# Patient Record
Sex: Female | Born: 1965 | Race: Black or African American | Hispanic: No | Marital: Single | Smoking: Current some day smoker
Health system: Southern US, Community
[De-identification: ages and names within clinical notes are randomized; demographics above are authoritative.]

## PROBLEM LIST (undated history)

## (undated) DIAGNOSIS — I10 Essential (primary) hypertension: Secondary | ICD-10-CM

## (undated) DIAGNOSIS — E785 Hyperlipidemia, unspecified: Secondary | ICD-10-CM

## (undated) DIAGNOSIS — G8929 Other chronic pain: Secondary | ICD-10-CM

## (undated) DIAGNOSIS — F32A Depression, unspecified: Secondary | ICD-10-CM

## (undated) DIAGNOSIS — R112 Nausea with vomiting, unspecified: Secondary | ICD-10-CM

## (undated) DIAGNOSIS — F419 Anxiety disorder, unspecified: Secondary | ICD-10-CM

## (undated) DIAGNOSIS — G4730 Sleep apnea, unspecified: Secondary | ICD-10-CM

## (undated) DIAGNOSIS — B449 Aspergillosis, unspecified: Secondary | ICD-10-CM

## (undated) DIAGNOSIS — Z9889 Other specified postprocedural states: Secondary | ICD-10-CM

## (undated) DIAGNOSIS — E119 Type 2 diabetes mellitus without complications: Secondary | ICD-10-CM

## (undated) DIAGNOSIS — M1990 Unspecified osteoarthritis, unspecified site: Secondary | ICD-10-CM

## (undated) HISTORY — DX: Sleep apnea, unspecified: G47.30

## (undated) HISTORY — DX: Other chronic pain: G89.29

## (undated) HISTORY — PX: FRACTURE SURGERY: SHX138

## (undated) HISTORY — DX: Type 2 diabetes mellitus without complications: E11.9

## (undated) HISTORY — DX: Unspecified osteoarthritis, unspecified site: M19.90

---

## 1997-06-17 HISTORY — PX: ABDOMINAL HYSTERECTOMY: SHX81

## 1998-09-16 ENCOUNTER — Emergency Department (HOSPITAL_COMMUNITY): Admission: EM | Admit: 1998-09-16 | Discharge: 1998-09-16 | Payer: Self-pay | Admitting: Emergency Medicine

## 1999-04-07 ENCOUNTER — Emergency Department (HOSPITAL_COMMUNITY): Admission: EM | Admit: 1999-04-07 | Discharge: 1999-04-07 | Payer: Self-pay | Admitting: Emergency Medicine

## 1999-09-21 ENCOUNTER — Emergency Department (HOSPITAL_COMMUNITY): Admission: EM | Admit: 1999-09-21 | Discharge: 1999-09-21 | Payer: Self-pay | Admitting: Emergency Medicine

## 1999-11-03 ENCOUNTER — Encounter: Payer: Self-pay | Admitting: Emergency Medicine

## 1999-11-03 ENCOUNTER — Emergency Department (HOSPITAL_COMMUNITY): Admission: EM | Admit: 1999-11-03 | Discharge: 1999-11-03 | Payer: Self-pay | Admitting: Emergency Medicine

## 1999-11-13 ENCOUNTER — Ambulatory Visit (HOSPITAL_COMMUNITY): Admission: RE | Admit: 1999-11-13 | Discharge: 1999-11-13 | Payer: Self-pay | Admitting: Orthopedic Surgery

## 2000-05-17 ENCOUNTER — Emergency Department (HOSPITAL_COMMUNITY): Admission: EM | Admit: 2000-05-17 | Discharge: 2000-05-17 | Payer: Self-pay | Admitting: Emergency Medicine

## 2000-06-08 ENCOUNTER — Inpatient Hospital Stay (HOSPITAL_COMMUNITY): Admission: EM | Admit: 2000-06-08 | Discharge: 2000-06-14 | Payer: Self-pay | Admitting: Emergency Medicine

## 2000-06-08 ENCOUNTER — Encounter: Payer: Self-pay | Admitting: Emergency Medicine

## 2000-06-09 ENCOUNTER — Encounter: Payer: Self-pay | Admitting: Surgery

## 2000-06-09 ENCOUNTER — Encounter: Payer: Self-pay | Admitting: General Surgery

## 2000-07-10 ENCOUNTER — Ambulatory Visit (HOSPITAL_COMMUNITY): Admission: RE | Admit: 2000-07-10 | Discharge: 2000-07-10 | Payer: Self-pay

## 2000-07-21 ENCOUNTER — Encounter: Admission: RE | Admit: 2000-07-21 | Discharge: 2000-09-25 | Payer: Self-pay | Admitting: General Surgery

## 2000-10-15 ENCOUNTER — Ambulatory Visit (HOSPITAL_BASED_OUTPATIENT_CLINIC_OR_DEPARTMENT_OTHER): Admission: RE | Admit: 2000-10-15 | Discharge: 2000-10-15 | Payer: Self-pay | Admitting: Orthopedic Surgery

## 2001-01-01 ENCOUNTER — Encounter: Admission: RE | Admit: 2001-01-01 | Discharge: 2001-02-20 | Payer: Self-pay | Admitting: Orthopedic Surgery

## 2002-03-23 ENCOUNTER — Encounter: Payer: Self-pay | Admitting: Family Medicine

## 2002-03-23 ENCOUNTER — Encounter: Admission: RE | Admit: 2002-03-23 | Discharge: 2002-03-23 | Payer: Self-pay | Admitting: Family Medicine

## 2002-04-06 ENCOUNTER — Encounter: Payer: Self-pay | Admitting: Family Medicine

## 2002-04-06 ENCOUNTER — Encounter: Admission: RE | Admit: 2002-04-06 | Discharge: 2002-04-06 | Payer: Self-pay | Admitting: Family Medicine

## 2002-08-01 ENCOUNTER — Emergency Department (HOSPITAL_COMMUNITY): Admission: EM | Admit: 2002-08-01 | Discharge: 2002-08-01 | Payer: Self-pay | Admitting: Emergency Medicine

## 2003-02-23 ENCOUNTER — Encounter: Admission: RE | Admit: 2003-02-23 | Discharge: 2003-02-23 | Payer: Self-pay | Admitting: Family Medicine

## 2003-02-23 ENCOUNTER — Encounter: Payer: Self-pay | Admitting: Family Medicine

## 2003-03-14 ENCOUNTER — Encounter: Admission: RE | Admit: 2003-03-14 | Discharge: 2003-03-14 | Payer: Self-pay | Admitting: Family Medicine

## 2003-03-14 ENCOUNTER — Encounter: Payer: Self-pay | Admitting: Family Medicine

## 2003-07-06 ENCOUNTER — Emergency Department (HOSPITAL_COMMUNITY): Admission: EM | Admit: 2003-07-06 | Discharge: 2003-07-07 | Payer: Self-pay | Admitting: Emergency Medicine

## 2004-03-27 ENCOUNTER — Emergency Department (HOSPITAL_COMMUNITY): Admission: EM | Admit: 2004-03-27 | Discharge: 2004-03-27 | Payer: Self-pay | Admitting: Emergency Medicine

## 2004-03-29 ENCOUNTER — Emergency Department (HOSPITAL_COMMUNITY): Admission: EM | Admit: 2004-03-29 | Discharge: 2004-03-30 | Payer: Self-pay | Admitting: Emergency Medicine

## 2004-03-31 ENCOUNTER — Emergency Department (HOSPITAL_COMMUNITY): Admission: EM | Admit: 2004-03-31 | Discharge: 2004-03-31 | Payer: Self-pay | Admitting: Emergency Medicine

## 2004-05-30 ENCOUNTER — Emergency Department (HOSPITAL_COMMUNITY): Admission: EM | Admit: 2004-05-30 | Discharge: 2004-05-31 | Payer: Self-pay | Admitting: Emergency Medicine

## 2004-08-26 ENCOUNTER — Emergency Department (HOSPITAL_COMMUNITY): Admission: EM | Admit: 2004-08-26 | Discharge: 2004-08-26 | Payer: Self-pay | Admitting: Emergency Medicine

## 2005-11-05 ENCOUNTER — Emergency Department (HOSPITAL_COMMUNITY): Admission: EM | Admit: 2005-11-05 | Discharge: 2005-11-05 | Payer: Self-pay | Admitting: Family Medicine

## 2005-12-23 ENCOUNTER — Encounter: Admission: RE | Admit: 2005-12-23 | Discharge: 2005-12-23 | Payer: Self-pay | Admitting: Family Medicine

## 2006-01-24 ENCOUNTER — Inpatient Hospital Stay (HOSPITAL_COMMUNITY): Admission: AD | Admit: 2006-01-24 | Discharge: 2006-01-24 | Payer: Self-pay | Admitting: Family Medicine

## 2006-06-14 ENCOUNTER — Emergency Department (HOSPITAL_COMMUNITY): Admission: EM | Admit: 2006-06-14 | Discharge: 2006-06-14 | Payer: Self-pay | Admitting: Emergency Medicine

## 2006-06-24 ENCOUNTER — Emergency Department (HOSPITAL_COMMUNITY): Admission: EM | Admit: 2006-06-24 | Discharge: 2006-06-24 | Payer: Self-pay | Admitting: Emergency Medicine

## 2006-06-27 ENCOUNTER — Emergency Department (HOSPITAL_COMMUNITY): Admission: EM | Admit: 2006-06-27 | Discharge: 2006-06-27 | Payer: Self-pay | Admitting: Emergency Medicine

## 2007-02-25 ENCOUNTER — Encounter: Admission: RE | Admit: 2007-02-25 | Discharge: 2007-02-25 | Payer: Self-pay | Admitting: Family Medicine

## 2007-06-09 ENCOUNTER — Emergency Department (HOSPITAL_COMMUNITY): Admission: EM | Admit: 2007-06-09 | Discharge: 2007-06-09 | Payer: Self-pay | Admitting: Emergency Medicine

## 2007-06-17 ENCOUNTER — Encounter: Admission: RE | Admit: 2007-06-17 | Discharge: 2007-06-17 | Payer: Self-pay | Admitting: Family Medicine

## 2007-06-25 ENCOUNTER — Ambulatory Visit (HOSPITAL_COMMUNITY): Admission: RE | Admit: 2007-06-25 | Discharge: 2007-06-25 | Payer: Self-pay | Admitting: Family Medicine

## 2007-09-09 ENCOUNTER — Encounter: Admission: RE | Admit: 2007-09-09 | Discharge: 2007-09-09 | Payer: Self-pay | Admitting: Family Medicine

## 2007-09-22 ENCOUNTER — Ambulatory Visit: Payer: Self-pay | Admitting: Thoracic Surgery

## 2007-10-05 ENCOUNTER — Encounter: Payer: Self-pay | Admitting: Thoracic Surgery

## 2007-10-05 ENCOUNTER — Inpatient Hospital Stay (HOSPITAL_COMMUNITY): Admission: RE | Admit: 2007-10-05 | Discharge: 2007-10-11 | Payer: Self-pay | Admitting: Thoracic Surgery

## 2007-10-06 ENCOUNTER — Ambulatory Visit: Payer: Self-pay | Admitting: Thoracic Surgery

## 2007-10-08 ENCOUNTER — Ambulatory Visit: Payer: Self-pay | Admitting: Internal Medicine

## 2007-10-20 ENCOUNTER — Encounter: Admission: RE | Admit: 2007-10-20 | Discharge: 2007-10-20 | Payer: Self-pay | Admitting: Thoracic Surgery

## 2007-10-20 ENCOUNTER — Ambulatory Visit: Payer: Self-pay | Admitting: Thoracic Surgery

## 2007-11-10 ENCOUNTER — Ambulatory Visit: Payer: Self-pay | Admitting: Thoracic Surgery

## 2007-11-10 ENCOUNTER — Encounter: Admission: RE | Admit: 2007-11-10 | Discharge: 2007-11-10 | Payer: Self-pay | Admitting: Thoracic Surgery

## 2007-11-30 ENCOUNTER — Emergency Department (HOSPITAL_COMMUNITY): Admission: EM | Admit: 2007-11-30 | Discharge: 2007-11-30 | Payer: Self-pay | Admitting: Family Medicine

## 2007-12-10 ENCOUNTER — Ambulatory Visit: Payer: Self-pay | Admitting: Thoracic Surgery

## 2008-01-06 ENCOUNTER — Encounter: Admission: RE | Admit: 2008-01-06 | Discharge: 2008-01-06 | Payer: Self-pay | Admitting: Thoracic Surgery

## 2008-01-06 ENCOUNTER — Ambulatory Visit: Payer: Self-pay | Admitting: Thoracic Surgery

## 2008-02-29 ENCOUNTER — Encounter: Admission: RE | Admit: 2008-02-29 | Discharge: 2008-02-29 | Payer: Self-pay | Admitting: Family Medicine

## 2008-03-14 ENCOUNTER — Emergency Department (HOSPITAL_COMMUNITY): Admission: EM | Admit: 2008-03-14 | Discharge: 2008-03-14 | Payer: Self-pay | Admitting: Emergency Medicine

## 2008-05-26 ENCOUNTER — Emergency Department (HOSPITAL_COMMUNITY): Admission: EM | Admit: 2008-05-26 | Discharge: 2008-05-26 | Payer: Self-pay | Admitting: Emergency Medicine

## 2008-06-17 HISTORY — PX: LUNG LOBECTOMY: SHX167

## 2008-11-22 ENCOUNTER — Emergency Department (HOSPITAL_COMMUNITY): Admission: EM | Admit: 2008-11-22 | Discharge: 2008-11-22 | Payer: Self-pay | Admitting: Emergency Medicine

## 2008-11-25 ENCOUNTER — Ambulatory Visit (HOSPITAL_BASED_OUTPATIENT_CLINIC_OR_DEPARTMENT_OTHER): Admission: RE | Admit: 2008-11-25 | Discharge: 2008-11-25 | Payer: Self-pay | Admitting: Orthopedic Surgery

## 2009-01-02 ENCOUNTER — Emergency Department (HOSPITAL_COMMUNITY): Admission: EM | Admit: 2009-01-02 | Discharge: 2009-01-02 | Payer: Self-pay | Admitting: Emergency Medicine

## 2009-02-04 ENCOUNTER — Encounter: Admission: RE | Admit: 2009-02-04 | Discharge: 2009-02-04 | Payer: Self-pay | Admitting: Orthopedic Surgery

## 2009-03-02 ENCOUNTER — Encounter: Admission: RE | Admit: 2009-03-02 | Discharge: 2009-03-02 | Payer: Self-pay | Admitting: Family Medicine

## 2009-03-17 ENCOUNTER — Ambulatory Visit (HOSPITAL_BASED_OUTPATIENT_CLINIC_OR_DEPARTMENT_OTHER): Admission: RE | Admit: 2009-03-17 | Discharge: 2009-03-17 | Payer: Self-pay | Admitting: Orthopedic Surgery

## 2009-04-13 ENCOUNTER — Encounter: Admission: RE | Admit: 2009-04-13 | Discharge: 2009-06-14 | Payer: Self-pay | Admitting: Orthopedic Surgery

## 2009-06-19 ENCOUNTER — Encounter: Admission: RE | Admit: 2009-06-19 | Discharge: 2009-08-01 | Payer: Self-pay | Admitting: Orthopedic Surgery

## 2009-08-14 ENCOUNTER — Emergency Department (HOSPITAL_COMMUNITY): Admission: EM | Admit: 2009-08-14 | Discharge: 2009-08-14 | Payer: Self-pay | Admitting: Emergency Medicine
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
## 2010-02-25 ENCOUNTER — Emergency Department (HOSPITAL_COMMUNITY): Admission: EM | Admit: 2010-02-25 | Discharge: 2010-02-25 | Payer: Self-pay | Admitting: Emergency Medicine

## 2010-05-09 ENCOUNTER — Emergency Department (HOSPITAL_COMMUNITY): Admission: EM | Admit: 2010-05-09 | Discharge: 2010-05-09 | Payer: Self-pay | Admitting: Family Medicine

## 2010-05-21 ENCOUNTER — Emergency Department (HOSPITAL_COMMUNITY)
Admission: EM | Admit: 2010-05-21 | Discharge: 2010-05-22 | Payer: Self-pay | Source: Home / Self Care | Admitting: Emergency Medicine

## 2010-05-23 ENCOUNTER — Emergency Department (HOSPITAL_COMMUNITY)
Admission: EM | Admit: 2010-05-23 | Discharge: 2010-05-23 | Payer: Self-pay | Source: Home / Self Care | Admitting: Emergency Medicine

## 2010-07-08 ENCOUNTER — Encounter: Payer: Self-pay | Admitting: Thoracic Surgery

## 2010-07-09 ENCOUNTER — Encounter: Payer: Self-pay | Admitting: Thoracic Surgery

## 2010-07-09 ENCOUNTER — Encounter: Payer: Self-pay | Admitting: Orthopedic Surgery

## 2010-07-09 ENCOUNTER — Encounter: Payer: Self-pay | Admitting: Family Medicine

## 2010-07-21 ENCOUNTER — Emergency Department (HOSPITAL_COMMUNITY): Payer: Self-pay

## 2010-07-21 ENCOUNTER — Emergency Department (HOSPITAL_COMMUNITY)
Admission: EM | Admit: 2010-07-21 | Discharge: 2010-07-21 | Disposition: A | Discharge status: Home / Self Care | Payer: Self-pay | Attending: Emergency Medicine | Admitting: Emergency Medicine

## 2010-07-21 DIAGNOSIS — R072 Precordial pain: Secondary | ICD-10-CM | POA: Insufficient documentation

## 2010-07-21 DIAGNOSIS — F3289 Other specified depressive episodes: Secondary | ICD-10-CM | POA: Insufficient documentation

## 2010-07-21 DIAGNOSIS — R4701 Aphasia: Secondary | ICD-10-CM | POA: Insufficient documentation

## 2010-07-21 DIAGNOSIS — R609 Edema, unspecified: Secondary | ICD-10-CM | POA: Insufficient documentation

## 2010-07-21 DIAGNOSIS — M549 Dorsalgia, unspecified: Secondary | ICD-10-CM | POA: Insufficient documentation

## 2010-07-21 DIAGNOSIS — R1013 Epigastric pain: Secondary | ICD-10-CM | POA: Insufficient documentation

## 2010-07-21 DIAGNOSIS — R0602 Shortness of breath: Secondary | ICD-10-CM | POA: Insufficient documentation

## 2010-07-21 DIAGNOSIS — F329 Major depressive disorder, single episode, unspecified: Secondary | ICD-10-CM | POA: Insufficient documentation

## 2010-07-21 DIAGNOSIS — I10 Essential (primary) hypertension: Secondary | ICD-10-CM | POA: Insufficient documentation

## 2010-07-21 LAB — DIFFERENTIAL
Basophils Relative: 0 % (ref 0–1)
Eosinophils Absolute: 0.7 10*3/uL (ref 0.0–0.7)
Eosinophils Relative: 8 % — ABNORMAL HIGH (ref 0–5)
Lymphs Abs: 3.8 10*3/uL (ref 0.7–4.0)
Monocytes Absolute: 0.8 10*3/uL (ref 0.1–1.0)
Monocytes Relative: 10 % (ref 3–12)
Neutrophils Relative %: 38 % — ABNORMAL LOW (ref 43–77)

## 2010-07-21 LAB — COMPREHENSIVE METABOLIC PANEL
ALT: 15 U/L (ref 0–35)
Alkaline Phosphatase: 53 U/L (ref 39–117)
BUN: 10 mg/dL (ref 6–23)
CO2: 24 mEq/L (ref 19–32)
Chloride: 104 mEq/L (ref 96–112)
Glucose, Bld: 97 mg/dL (ref 70–99)
Potassium: 3.6 mEq/L (ref 3.5–5.1)
Sodium: 136 mEq/L (ref 135–145)
Total Bilirubin: 0.3 mg/dL (ref 0.3–1.2)
Total Protein: 7.2 g/dL (ref 6.0–8.3)

## 2010-07-21 LAB — CBC
MCH: 30.7 pg (ref 26.0–34.0)
MCHC: 34.4 g/dL (ref 30.0–36.0)
MCV: 89.3 fL (ref 78.0–100.0)
Platelets: 324 10*3/uL (ref 150–400)
RBC: 4.56 MIL/uL (ref 3.87–5.11)
RDW: 13.3 % (ref 11.5–15.5)

## 2010-07-21 LAB — POCT CARDIAC MARKERS
Troponin i, poc: <0.05 ng/mL (ref 0.00–0.09)
Troponin i, poc: <0.05 ng/mL (ref 0.00–0.09)

## 2010-07-21 LAB — URINALYSIS, ROUTINE W REFLEX MICROSCOPIC
Bilirubin Urine: NEGATIVE
Leukocytes, UA: NEGATIVE
Nitrite: NEGATIVE
Specific Gravity, Urine: 1.025 (ref 1.005–1.030)
Urine Glucose, Fasting: NEGATIVE mg/dL
pH: 5.5 (ref 5.0–8.0)

## 2010-07-21 LAB — BRAIN NATRIURETIC PEPTIDE: Pro B Natriuretic peptide (BNP): <30 pg/mL (ref 0.0–100.0)

## 2010-07-21 LAB — URINE MICROSCOPIC-ADD ON

## 2010-07-23 LAB — URINE CULTURE

## 2010-09-05 LAB — URINALYSIS, ROUTINE W REFLEX MICROSCOPIC
Bilirubin Urine: NEGATIVE
Glucose, UA: NEGATIVE mg/dL
Specific Gravity, Urine: 1.029 (ref 1.005–1.030)

## 2010-09-05 LAB — COMPREHENSIVE METABOLIC PANEL WITH GFR
BUN: 11 mg/dL (ref 6–23)
Calcium: 8.8 mg/dL (ref 8.4–10.5)
Creatinine, Ser: 0.71 mg/dL (ref 0.4–1.2)
Glucose, Bld: 111 mg/dL — ABNORMAL HIGH (ref 70–99)
Total Protein: 6.7 g/dL (ref 6.0–8.3)

## 2010-09-05 LAB — COMPREHENSIVE METABOLIC PANEL
ALT: 16 U/L (ref 0–35)
AST: 16 U/L (ref 0–37)
Albumin: 3.4 g/dL — ABNORMAL LOW (ref 3.5–5.2)
Alkaline Phosphatase: 41 U/L (ref 39–117)
CO2: 31 mEq/L (ref 19–32)
Chloride: 101 mEq/L (ref 96–112)
GFR calc Af Amer: >60 mL/min (ref >60)
GFR calc non Af Amer: >60 mL/min (ref >60)
Potassium: 3.5 mEq/L (ref 3.5–5.1)
Sodium: 137 mEq/L (ref 135–145)
Total Bilirubin: 0.4 mg/dL (ref 0.3–1.2)

## 2010-09-05 LAB — URINE MICROSCOPIC-ADD ON

## 2010-09-05 LAB — URINE CULTURE: Colony Count: 90000

## 2010-09-05 LAB — LIPASE, BLOOD: Lipase: 23 U/L (ref 11–59)

## 2010-09-05 LAB — DIFFERENTIAL
Basophils Absolute: 0.1 10*3/uL (ref 0.0–0.1)
Basophils Relative: 1 % (ref 0–1)
Eosinophils Absolute: 0.3 10*3/uL (ref 0.0–0.7)
Eosinophils Relative: 4 % (ref 0–5)
Lymphocytes Relative: 34 % (ref 12–46)
Lymphs Abs: 3.1 K/uL (ref 0.7–4.0)
Monocytes Absolute: 1 10*3/uL (ref 0.1–1.0)
Monocytes Relative: 11 % (ref 3–12)
Neutro Abs: 4.7 K/uL (ref 1.7–7.7)
Neutrophils Relative %: 50 % (ref 43–77)

## 2010-09-05 LAB — CBC
HCT: 38 % (ref 36.0–46.0)
Hemoglobin: 13 g/dL (ref 12.0–15.0)
MCHC: 34.1 g/dL (ref 30.0–36.0)
MCV: 91.8 fL (ref 78.0–100.0)
Platelets: 342 10*3/uL (ref 150–400)
RBC: 4.14 MIL/uL (ref 3.87–5.11)
RDW: 13.1 % (ref 11.5–15.5)
WBC: 9.2 10*3/uL (ref 4.0–10.5)

## 2010-09-21 LAB — BASIC METABOLIC PANEL
BUN: 7 mg/dL (ref 6–23)
CO2: 29 mEq/L (ref 19–32)
Calcium: 9.8 mg/dL (ref 8.4–10.5)
Glucose, Bld: 137 mg/dL — ABNORMAL HIGH (ref 70–99)
Sodium: 139 mEq/L (ref 135–145)

## 2010-09-24 LAB — CBC
HCT: 40.4 % (ref 36.0–46.0)
Hemoglobin: 13.8 g/dL (ref 12.0–15.0)
MCHC: 34.1 g/dL (ref 30.0–36.0)
MCV: 94 fL (ref 78.0–100.0)
RBC: 4.3 MIL/uL (ref 3.87–5.11)

## 2010-09-24 LAB — BASIC METABOLIC PANEL
CO2: 24 mEq/L (ref 19–32)
Chloride: 104 mEq/L (ref 96–112)
GFR calc Af Amer: >60 mL/min (ref >60)
Potassium: 3.5 mEq/L (ref 3.5–5.1)
Sodium: 139 mEq/L (ref 135–145)

## 2010-09-24 LAB — COMPREHENSIVE METABOLIC PANEL
ALT: 22 U/L (ref 0–35)
AST: 24 U/L (ref 0–37)
Albumin: 3.5 g/dL (ref 3.5–5.2)
Calcium: 9.5 mg/dL (ref 8.4–10.5)
Chloride: 108 mEq/L (ref 96–112)
Creatinine, Ser: 0.73 mg/dL (ref 0.4–1.2)
GFR calc Af Amer: >60 mL/min (ref >60)
Sodium: 140 mEq/L (ref 135–145)

## 2010-09-24 LAB — POCT I-STAT, CHEM 8
BUN: 7 mg/dL (ref 6–23)
Sodium: 139 mEq/L (ref 135–145)
TCO2: 27 mmol/L (ref 0–100)

## 2010-09-24 LAB — POCT CARDIAC MARKERS: Troponin i, poc: <0.05 ng/mL (ref 0.00–0.09)

## 2010-09-24 LAB — URINALYSIS, ROUTINE W REFLEX MICROSCOPIC
Bilirubin Urine: NEGATIVE
Ketones, ur: NEGATIVE mg/dL
Specific Gravity, Urine: 1.026 (ref 1.005–1.030)
Urobilinogen, UA: 0.2 mg/dL (ref 0.0–1.0)

## 2010-09-24 LAB — DIFFERENTIAL
Basophils Relative: 0 % (ref 0–1)
Eosinophils Absolute: 0.3 10*3/uL (ref 0.0–0.7)
Eosinophils Relative: 3 % (ref 0–5)
Monocytes Absolute: 0.5 10*3/uL (ref 0.1–1.0)
Monocytes Relative: 5 % (ref 3–12)
Neutro Abs: 6.6 10*3/uL (ref 1.7–7.7)

## 2010-09-24 LAB — URINE MICROSCOPIC-ADD ON

## 2010-09-24 LAB — APTT: aPTT: 27 seconds (ref 24–37)

## 2010-10-30 NOTE — Assessment & Plan Note (Signed)
OFFICE VISIT   CHI, WOODHAM A  DOB:  03/15/1966                                        Nov 10, 2007  CHART #:  [id number]   [name] came today.  Her blood pressure was 140/80, pulse 80,  respirations 18, sats are 95%.  Incision was well-healed.  Lungs are  clear to auscultation and percussion.  Overall, she is doing well.  I  plan to see her back again in 2 months with a chest x-ray.  She is  feeling much better than she did before.   [name], M.D.  Electronically Signed   DPB/MEDQ  D:  11/10/2007  T:  11/10/2007  Job:  [id number]

## 2010-10-30 NOTE — Letter (Signed)
Oct 20, 2007   Bryan Lemma. Manus Gunning, M.D.  [address] [place]  [place] [address]   Re:  Denise, GHAZARIAN [name]                DOB:  Mar 13, 1966   Dear Dr. Manus Gunning:   [name] came back today.  We did [name] on her, and she had [name] bronchiectasis with organized  pneumonia, which turned out to culture out an Aspergillosis.  No further  treatment was indicated after we had her seen by infectious disease.  Her incisions are well-healed and removed her chest tube sutures.  She  is still having [name] moderate amount of pain.  Gave her [name] refill for  Percocet.  Her blood pressure was 130/84, pulse 98, respirations 18,  sats were 94%.   I will see her back again in 3 weeks with [name] chest x-ray.   [name], M.D.  Electronically Signed   DPB/MEDQ  D:  10/20/2007  T:  10/20/2007  Job:  [id number]

## 2010-10-30 NOTE — Discharge Summary (Signed)
[name], [name]                ACCOUNT NO.:  [id number]   MEDICAL RECORD NO.:  [id number]           PATIENT TYPE:   LOCATION:                                 FACILITY:   PHYSICIAN:  [name], M.D. 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   SECONDARY DIAGNOSIS:  Hypertension.   IN-HOSPITAL OPERATIONS AND PROCEDURES:  Right video-assisted  thoracoscopic surgery with right thoracotomy, right lower lobe superior  segmentectomy and lymph node biopsy.   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  On recent chest x-ray she  was found to have a 9-mm right lower lobe superior segmental lesion.  This was confirmed in December, with a PET scan.  Followup CT scan  showed an increased up to 10.5 mm from prior measurement.  PET scan was  done and was nondiagnostic.  The patient smokes a pack and a half a day.  She denies fever, chills, or excessive sputum.   Pulmonary function studies done show a FVC 3.55 and FEV-1 at 2.89.  The  patient was seen and evaluated by Dr. Edwyna Shell.  Dr. Edwyna Shell discussed with  the patient undergoing right lower lobe superior segmentectomy.  He  discussed the risks and benefits with the patient.  The patient  acknowledged understanding and agreed to proceed.  Surgery was scheduled  for October 05, 2007.  For details of the patient's past medical history  and physical exam please see dictated H&P.   The patient was taken to the operating room in October 05, 2007, where she  underwent right video-assisted thoracoscopic surgery with right  thoracotomy, on the right lower lobe superior segmentectomy and lymph  node biopsy.  The patient tolerated this procedure well, and she was  transferred to the intensive care unit in stable  condition.  Postoperatively, the patient was able to be extubated following surgery.  She was noted to be alert and oriented x4.  Neuro intact.  The patient  was also noted to be hemodynamically stable postoperatively.   Chest x-rays done postop day 1 was stable.  She had no air leak noted.  Minimal drainage from chest tubes.  Suction was decreased at this time.  Repeat chest x-ray done postop day 2.  Chest x-ray remained stable and  when chest tube was discontinued, with the remaining chest tube placed  on water seal.  Again, repeat chest x-ray done postop day 2 remained  stable with no air leak and remaining chest tube DC.  We will plan  repeat PMI, chest x-ray in the a.m. prior to discharge home.  The  patient worked on her incentive spirometer during this time.  She was  able to be weaned off oxygen, sating greater than 90% on room air.  The  patient's pathology report came back showing positive for aspergillus  granuloma in the right lower lobe.  Infectious  disease was consulted.  We then evaluated the patient and stated that no further testing or  treatment was needed at this time.  It was felt that the resection was  suitable.  The remainder of her postoperative course was unremarkable.  Vital signs remained stable.  She remained afebrile.  All incisions  clean, dry and intact and healing well.  She was in normal sinus rhythm  during her postoperative course.  The patient was out of bed ambulating  well with assistance.  She was tolerating diet well.  No nausea or  vomiting noted.   The patient is tentatively ready for discharge home postop day 5 October 09, 2007, pending she remained stable.   FOLLOWUP APPOINTMENTS:  Followup appointment arranged with Dr. Edwyna Shell  for Oct 20, 2007, at 4:15 p.m..  The patient will need to obtain PMI,  chest x-ray 30 minutes prior to this appointment.   ACTIVITY:  The patient was instructed no driving, she agrees to do so,  no heavy lifting over  10 pounds.  She is told to ambulate 3-4 times per  day, progress as tolerated and continue her breathing exercises.   INCISIONAL CARE:  The patient is told to shower washing her incisions  using soap and water.  She is to contact the office if she develops any  drainage or opening from any of her incision sites.   DIET:  The patient is to begin on diet to be low fat, low salt.   DISCHARGE MEDICATIONS:  1. Paxil daily.  2. Flexeril p.r.n.  3. Valium nightly.  4. Ambien nightly.  5. HCTZ 25 mg daily.  6. Guaifenesin 600 mg b.i.d.  7. Percocet 5/325 1-2 tablets q.4-6 hours p.r.n.      Theda Belfast, [place]      [name], M.D.  Electronically Signed    KMD/MEDQ  D:  10/09/2007  T:  10/10/2007  Job:  [id number]

## 2010-10-30 NOTE — Op Note (Signed)
[name], LABELLA                ACCOUNT NO.:  [id number]   MEDICAL RECORD NO.:  [id number]          PATIENT TYPE:  EMS   LOCATION:  MAJO                         FACILITY:  [facility]   PHYSICIAN:  Harvie Junior, M.D.   DATE OF BIRTH:  December 27, 1965   DATE OF PROCEDURE:  11/25/2008  DATE OF DISCHARGE:  11/22/2008                               OPERATIVE REPORT   PREOPERATIVE DIAGNOSIS:  Significant degenerative change, left ankle.   POSTOPERATIVE DIAGNOSIS:  Significant degenerative change, left ankle.   PRINCIPAL PROCEDURE:  Ankle arthroscopy with massive debridement of  degenerative cartilage and synovium, left ankle.   SURGEON:  Harvie Junior, M.D.   ASSISTANT:  Marshia Ly, P.A.   ANESTHESIA:  General.   BRIEF HISTORY:  [name] is a 45 year old female with long history of  having had significant left ankle pain.  We treated her arthroscopically  many years ago and she had done well with that.  She began having  increasing pain.  Injection therapies helped but had ultimately failed  and ultimately was taken to the operating room because of continued  complaints of pain.  She had pretty large flattening of the tibia on  preoperative x-rays when they have to take a little bit of bone to try  to open up the ankle joint.  She was brought to the operating room for  this procedure.   PROCEDURE:  The patient was brought to the operating room.  After  adequate anesthesia was obtained with general anesthetic, the patient  was placed supine on the operating table.  The left leg was prepped and  draped in usual sterile fashion.  Following this, the leg was  exsanguinated of the extremity the blood pressure tourniquet was  inflated to 350 mmHg.  Following this, the old incisions were explored  and the ankle scope and instruments were placed into the joint.  There  were significant degenerative changes with flaps of articular cartilage.  Mostly in the anterior area, we took a oval burr  and did a burr of the  anterior tibia to really give some access to that anterior area and took  a shaver and kind of a cleaned up, a bit of cartilage, increased  synovial tissue, and really got it cleaned up pretty nicely but it is  really not a pretty picture and they are relative to significant grade 3  and grade 4 changes on  the tibia and on the talus.  At this point, the knee and ankles were  copiously irrigated and suctioned dry.  All sterile portals were closed  with interrupted stitches.  Sterile compressive dressing was applied,  and the patient was taken to recovery room and was noted to be in  satisfactory condition.  Estimated blood loss for this procedure was  none.      Harvie Junior, M.D.  Electronically Signed     Harvie Junior, M.D.  Electronically Signed    JLG/MEDQ  D:  11/25/2008  T:  11/26/2008  Job:  [id number]

## 2010-10-30 NOTE — Op Note (Signed)
[name], [name]                ACCOUNT NO.:  [id number]   MEDICAL RECORD NO.:  [id number]          PATIENT TYPE:  INP   LOCATION:  3313                         FACILITY:  [facility]   PHYSICIAN:  [name], M.D. DATE OF BIRTH:  [birth year]   DATE OF PROCEDURE:  DATE OF DISCHARGE:                               OPERATIVE REPORT   PREOPERATIVE DIAGNOSIS:  Enlarging right lower lobe lesion.   POSTOPERATIVE DIAGNOSIS:  Possible inflammatory lesion, right lower  lobe.   OPERATION PERFORMED:  Right video-assisted thoracoscopy (VAT), , mini  thoracotomy and right lower lobe superior segmentectomy.   SURGEON:  [name], MD   FIRST ASSISTANT:  Stephanie Acre. Dominick, PA-C   ANESTHESIA:  General anesthesia.   DESCRIPTION OF PROCEDURE:  After percutaneous insertion of all monitor  lines, the patient underwent general anesthesia, was turned to the right  lateral thoracotomy position.  This patient had an enlarging right lower  lobe superior segmental lesion and is a smoker.  It was positive on PET  scan.  After insertion of a dual-lumen tube, the right lung was  deflated.  Two trocar sites were made in the anterior and posterior  axillary lines at seventh intercostal space.  Two trocars were inserted.  A 0-degree scope was inserted, and the lesion could not be seen on the  pleura, so a posterior small incision was made over the triangle of  auscultation.  Dissection was carried down to the subcutaneous tissue,  and the latissimus was partially divided, the fifth intercostal space  was entered, __________  was placed in the space.  Dissection was  started in the fissure, after identifying where the nodule was, which  was in the superior segment of the right lower lobe.  The superior  segmental artery was dissected out, stapled, and divided with an  autosuture 2-mm stapler.  Then, several 10R and 11R nodes were dissected  free.  We then dissected up the superior segmental bronchus,  stapled,  and divided with an Autosuture 3.5-mm stapler, and the vein to the  superior segment was then doubly clipped with the clips and divided, and  the lesion was removed with an Autosuture, 60 green stapler in 2  applications.  We were worried about margin, so we did a secondary  margin with the Autosuture stapler, and then applied CoSeal to the  staple line.  The frozen section revealed a probable inflammatory  process, but the etiology was unknown.  Two chest tubes were brought in  through the trocar sites.  Chest was closed with 2 pericostal drilling  through the sixth rib and passed around the fifth rib, #1 Vicryl in the  muscle layer, 2-0 Vicryl in the subcutaneous tissue, and Dermabond for  the skin.  The patient returned to recovery room in stable condition.      [name], M.D.  Electronically Signed     DPB/MEDQ  D:  10/05/2007  T:  10/06/2007  Job:  [id number]

## 2010-10-30 NOTE — Assessment & Plan Note (Signed)
OFFICE VISIT   SISTER, CARBONE A  DOB:  [birth year]                                        December 10, 2007  CHART #:  [id number]   The patient came today complaining some postthoracotomy pain.  She  states that this has improved and then about 2 weeks ago, it got much  worse.  The pain starts in her right shoulder and runs on her back and  goes to her right breast and she has to use a heating pad for this.  Her  blood pressure was 141/92, pulse 85, respirations 18, and sats were 85%.  Lungs are clear to auscultation and percussion.  It sounds like this is  neurogenic in origin and we will start her on Neurontin 300 mg twice a  day increasing to 600 mg twice a day and I gave her 40 of Tylox for the  pain.  I will see her back again in 3 weeks to reevaluate her.  I could  add Lyrica or some type of antiinflammatory such as Celebrex or  Voltaren.   [name], M.D.  Electronically Signed   DPB/MEDQ  D:  12/10/2007  T:  12/10/2007  Job:  [id number]

## 2010-10-30 NOTE — Assessment & Plan Note (Signed)
OFFICE VISIT   RUE, VALLADARES A  DOB:  1966-01-04                                        January 06, 2008  CHART #:  [id number]   The patient came for followup today and she is doing much better since  we started the Neurontin and the Tylox.  Her chest pain is resolved.  Her x-ray is stable.  She is doing well overall, and I will just release  her back to her medical doctor, and we will see her again if she has any  further problems.  She knows to let us know, if there is a continued  problem.   [name], M.D.  Electronically Signed   DPB/MEDQ  D:  01/06/2008  T:  01/07/2008  Job:  [id number]

## 2010-10-30 NOTE — H&P (Signed)
[name], [name]                ACCOUNT NO.:  [id number]   MEDICAL RECORD NO.:  [id number]           PATIENT TYPE:   LOCATION:                                 FACILITY:   PHYSICIAN:  [name], M.D. DATE OF BIRTH:  11/12/1965   DATE OF ADMISSION:  DATE OF DISCHARGE:                              HISTORY & PHYSICAL   CHIEF COMPLAINT:  Right lung mass.   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  This was confirmed in December  with a PET scan.  Followup CT scan showed it increased up to 10.5 mm  from 8.8 mm.  A PET scan was done and was nondiagnostic.  She smokes a  pack and half a day.  She has had no fever, chills, or excessive sputum.  No hemoptysis.  Pulmonary function, spirometry shows an FVC of 3.55 with  an FEV1 of 2.89.   PAST MEDICAL HISTORY:  Hypertension and morbid obesity.   ALLERGIES:  She has no allergies.   MEDICATIONS:  1. Hydrochlorothiazide 25 mg daily.  2. Flexeril p.r.n.  3. Tramadol p.r.n.  4. Vicodin p.r.n.   FAMILY HISTORY:  Noncontributory.   SOCIAL HISTORY:  She has 3 children.  She smokes now half a pack of  cigarettes daily.  She does not drink alcohol on a regular basis.   REVIEW OF SYSTEMS:  GENERAL:  She is 5 feet 8 inches.  She is 310  pounds.  She had some recent weight gain.  CARDIAC:  She has no angina  or atrial fibrillation.  PULMONARY:  No nausea, vomiting, constipation,  diarrhea, or GERD.  GU:  No dysuria or frequent urination.  VASCULAR:  No claudication, DVT, or TIAs.  NEUROLOGIC:  No dizziness, headache,  blackouts, or seizures.  MUSCULOSKELETAL:  She has joint pain and  muscular pain.  PSYCHIATRIC:  She has been treated for depression.  HEENT:  No change in her eyesight or hearing.  HEMATOLOGIC:  No problems  with bleeding or clotting disorders or anemia.   PHYSICAL EXAMINATION:   GENERAL:  She is an obese African American female  in no acute distress.  VITAL SIGNS:  Her blood pressure is 132/64, pulse 78, respirations 18,  sats were 94%.  HEENT:  Head is atraumatic.  Eyes: Pupils equal and reactive to light  and accommodation.  Extraocular movements are normal.  Ears:  Tympanic  membranes are intact.  Nose:  There is no septal deviation.  NECK:  Supple without thyromegaly.  Thyroid is without lesion.  CHEST:  Clear to auscultation and percussion.  HEART:  Regular sinus rhythm.  No murmurs.  ABDOMEN:  Obese.  Bowel sounds are normal.  There is no  hepatosplenomegaly.  EXTREMITIES:  Pulses 2+.  There is 1+ edema.  No clubbing.  NEUROLOGIC:  She is oriented x3.  Sensory and motor intact.  Cranial  nerves are intact.  SKIN:  Without lesion.   IMPRESSION:  1.  Enlarging right lower lobe superior segmental lesion.  2. Hypertension.  3. Possible obesity.   PLAN:  Right VATS minithoracotomy, right lower lobe wedge resection, and  possible right lower lobe segmentectomy.      [name], M.D.  Electronically Signed     DPB/MEDQ  D:  10/01/2007  T:  10/02/2007  Job:  [id number]

## 2010-10-30 NOTE — Letter (Signed)
September 22, 2007   Bryan Lemma. Manus Gunning, M.D.  [address] [place], [place] [address]   Re:  [name], [name]                DOB:  [birth year]   Dear Dr. Manus Gunning:   I saw Ms. Aguayo in the office today.  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  A PET scan was done at that time and was nondiagnostic,  partially maybe because of the scan .  A followup CT scan revealed that  this lesion had increased to 10.5 from 8.8 and she is referred here for  evaluation.  She smokes approximately a half pack of cigarettes a day.  She has had no fevers, chills, excessive sputum.  Her pulmonary function  spirometry shows an FVC of 3.55 with an FEV1 of 2.89.   PAST MEDICAL HISTORY:  She has no allergies.  She takes  hydrochlorothiazide 25 mg a day, Flexeril p.r.n., Tramadol p.r.n. and  Vicodin p.r.n.  She has mild hypertension and obesity.   FAMILY HISTORY:  Noncontributory.   SOCIAL HISTORY:  She has 3 children.  She  works as a Lawyer.  She smokes,  as mentioned, a half a pack of cigarettes per day and no definite  alcohol intake.   REVIEW OF SYSTEMS:  She is 5 foot 8, she is 310 pounds.  GENERAL:  She had some recent weight gain.  CARDIAC:  She has some angina and atrial fibrillation __________.  PULMONARY:  She has no hemoptysis.  She has no nausea, vomiting, constipation, diarrhea, or GERD.  She has no kidney disease, dysuria, or frequent urination.  VASCULAR:  No claudication, DVT, or TIA.  NEUROLOGIC:  No headaches, blackout, or seizures.  MUSCULOSKELETAL:  She has joint pain and muscular pain.  She has been treated recently for depression.  HEENT:  No change in her eyesight or her hearing.  HEMATOLOGIC:  No problems with bleeding or clotting disorders.   PHYSICAL EXAM:  She is an obese [place] American female in no acute  distress.  Her blood  pressure is 132/64, pulse 78, respirations 18, and  temperature is __________.  Head, eyes, ears, nose and throat are  unremarkable.  Neck: Supple without thyromegaly.  There is no  supraclavicular or axillary adenopathy.  Chest:  Clear to auscultation  and percussion.  Heart:  Regular sinus rhythm with no murmurs.  Abdomen:  Obese, bowel sounds are normal.  Extremities:  Pulses are 2+, there is  1+ edema, and no clubbing.  Neurologic:  She is oriented x3.  Sensory  and motor intact.  Cranial nerves intact.   I feel that she has an enlarging nodule and unfortunately this will need  to be excised.  I have recommended she have a right video-assisted  thoracoscopic surgery with a right lower lobe superior segmentectomy.  Even though the PET scan was negative,  this is either  an enlarging  benign tumor or a low grade cancer.  We will plan to do this on April  20th at [facility].   [name], M.D.  Electronically Signed   DPB/MEDQ  D:  09/22/2007  T:  09/22/2007  Job:  [id number]
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
## 2010-11-06 NOTE — Op Note (Signed)
. [facility]  Patient:    [name], [name]                       MRN: [id number] Proc. Date: 10/15/00 Adm. Date:  [id number] Disc. Date: [id number] Attending:  Milly Jakob                           Operative Report  PREOPERATIVE DIAGNOSIS:  Impingement, lateral gutter left ankle.  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  SURGEON:  Harvie Junior, M.D.  ASSISTANT:  Currie Paris. Thedore Mins.  ANESTHESIA:  General.  BRIEF HISTORY:  The patient is a 45 year old female with a long history of severe left ankle pain after an injury.  DESCRIPTION OF PROCEDURE:  She was ultimately brought to the operating room for left ankle arthroscopy because of failure of conservative care. Arthroscopy showed that the patient had a large osteochondral defect on the talus, basically going from the medial gutter all the way to the lateral gutter.  This was involving the articular portion.  The patient underwent debridement of this large osteochondral defect as well as synovectomy of the joint.  The patient tolerated the procedure well.  There were no complications.  The patient was ultimately taken from the operating room in a posterior splint after closure of her wounds with horizontal mattress interrupted sutures.  A sterile and compressive dressing was applied to the ankle, and she was taken to the recovery room in a posterior splint. Estimated blood loss for the procedure was none. DD:  01/02/01 TD:  01/03/01 Job: [id number]

## 2010-11-22 ENCOUNTER — Inpatient Hospital Stay (INDEPENDENT_AMBULATORY_CARE_PROVIDER_SITE_OTHER)
Admission: RE | Admit: 2010-11-22 | Discharge: 2010-11-22 | Disposition: A | Discharge status: Home / Self Care | Payer: Self-pay | Source: Ambulatory Visit | Attending: Emergency Medicine | Admitting: Emergency Medicine

## 2010-11-22 ENCOUNTER — Ambulatory Visit (INDEPENDENT_AMBULATORY_CARE_PROVIDER_SITE_OTHER): Payer: Self-pay

## 2010-11-22 DIAGNOSIS — R05 Cough: Secondary | ICD-10-CM

## 2010-11-22 DIAGNOSIS — R059 Cough, unspecified: Secondary | ICD-10-CM

## 2010-11-22 DIAGNOSIS — J40 Bronchitis, not specified as acute or chronic: Secondary | ICD-10-CM

## 2011-03-12 LAB — TYPE AND SCREEN: ABO/RH(D): O POS

## 2011-03-12 LAB — CBC
HCT: 35.1 — ABNORMAL LOW
HCT: 39.8
Hemoglobin: 12.1
MCHC: 33.6
MCV: 93.2
Platelets: 310
RBC: 3.91
RDW: 13.2
WBC: 11 — ABNORMAL HIGH
WBC: 7.9

## 2011-03-12 LAB — URINALYSIS, ROUTINE W REFLEX MICROSCOPIC
Leukocytes, UA: NEGATIVE
Protein, ur: NEGATIVE
Specific Gravity, Urine: 1.019
Urobilinogen, UA: 0.2

## 2011-03-12 LAB — AFB CULTURE WITH SMEAR (NOT AT ARMC): Acid Fast Smear: NONE SEEN

## 2011-03-12 LAB — COMPREHENSIVE METABOLIC PANEL
ALT: 19
AST: 15
Albumin: 3.5
Alkaline Phosphatase: 37 — ABNORMAL LOW
Alkaline Phosphatase: 41
BUN: 7
Chloride: 100
Chloride: 101
GFR calc Af Amer: >60
GFR calc non Af Amer: >60
Glucose, Bld: 125 — ABNORMAL HIGH
Potassium: 3.9
Potassium: 4
Sodium: 136
Total Bilirubin: 0.3
Total Bilirubin: 0.4

## 2011-03-12 LAB — BASIC METABOLIC PANEL
CO2: 26
Calcium: 8.4
Creatinine, Ser: 0.84
GFR calc Af Amer: >60
GFR calc non Af Amer: >60
Sodium: 135

## 2011-03-12 LAB — BLOOD GAS, ARTERIAL
Bicarbonate: 25.4 — ABNORMAL HIGH
Bicarbonate: 25.7 — ABNORMAL HIGH
FIO2: 0.21
O2 Content: 3
Patient temperature: 93
Patient temperature: 98.6
pCO2 arterial: 39.9
pH, Arterial: 7.403 — ABNORMAL HIGH
pH, Arterial: 7.44 — ABNORMAL HIGH

## 2011-03-12 LAB — URINE MICROSCOPIC-ADD ON

## 2011-03-12 LAB — FUNGUS CULTURE W SMEAR: Fungal Smear: NONE SEEN

## 2011-03-12 LAB — TISSUE CULTURE

## 2011-03-18 LAB — RAPID STREP SCREEN (MED CTR MEBANE ONLY): Streptococcus, Group A Screen (Direct): NEGATIVE

## 2011-03-22 LAB — URINALYSIS, ROUTINE W REFLEX MICROSCOPIC
Bilirubin Urine: NEGATIVE
Ketones, ur: NEGATIVE
Nitrite: NEGATIVE
Protein, ur: NEGATIVE
Urobilinogen, UA: 0.2

## 2011-03-22 LAB — PREGNANCY, URINE: Preg Test, Ur: NEGATIVE

## 2011-04-25 ENCOUNTER — Encounter: Payer: Self-pay | Admitting: *Deleted

## 2011-04-25 ENCOUNTER — Emergency Department (INDEPENDENT_AMBULATORY_CARE_PROVIDER_SITE_OTHER)
Admission: EM | Admit: 2011-04-25 | Discharge: 2011-04-25 | Disposition: A | Discharge status: Home / Self Care | Payer: Self-pay | Source: Home / Self Care | Attending: Family Medicine | Admitting: Family Medicine

## 2011-04-25 DIAGNOSIS — J40 Bronchitis, not specified as acute or chronic: Secondary | ICD-10-CM

## 2011-04-25 HISTORY — DX: Essential (primary) hypertension: I10

## 2011-04-25 MED ORDER — AZITHROMYCIN 250 MG PO TABS: 250.0000 mg | ORAL_TABLET | Freq: Every day | ORAL | Status: AC

## 2011-04-25 MED ORDER — GUAIFENESIN-CODEINE 100-10 MG/5ML PO SYRP: 5.0000 mL | ORAL_SOLUTION | Freq: Four times a day (QID) | ORAL | Status: AC | PRN

## 2011-04-25 NOTE — ED Provider Notes (Signed)
History     CSN: [id number] Arrival date & time: 04/25/2011 11:27 AM   First MD Initiated Contact with Patient 04/25/11 1230      Chief Complaint  Patient presents with  . Cough    (Consider location/radiation/quality/duration/timing/severity/associated sxs/prior treatment) Patient is a 45 y.o. female presenting with cough. The history is provided by the patient.  Cough This is a new problem. The current episode started more than 2 days ago. The problem occurs constantly. The problem has been gradually worsening. The cough is productive of sputum. There has been no fever. Associated symptoms include rhinorrhea. Pertinent negatives include no chest pain, no shortness of breath and no wheezing. She has tried nothing for the symptoms. She is a smoker.    Past Medical History  Diagnosis Date  . Hypertension     Past Surgical History  Procedure Date  . Abdominal hysterectomy   . Fracture surgery     Family History  Problem Relation Age of Onset  . COPD Mother     History  Substance Use Topics  . Smoking status: Current Everyday Smoker  . Smokeless tobacco: Not on file  . Alcohol Use: Yes     socialy    OB History    Grav Para Term Preterm Abortions TAB SAB Ect Mult Living                  Review of Systems  Constitutional: Negative.   HENT: Positive for congestion and rhinorrhea. Negative for postnasal drip.   Respiratory: Positive for cough. Negative for shortness of breath and wheezing.   Cardiovascular: Negative for chest pain.  Gastrointestinal: Negative.   Genitourinary: Negative.     Allergies  Review of patient's allergies indicates no known allergies.  Home Medications   Current Outpatient Rx  Name Route Sig Dispense Refill  . AZITHROMYCIN 250 MG PO TABS Oral Take 1 tablet (250 mg total) by mouth daily. Take first 2 tablets together, then 1 every day until finished. 6 tablet 0  . GUAIFENESIN-CODEINE 100-10 MG/5ML PO SYRP Oral Take 5 mLs by mouth  every 6 (six) hours as needed for cough. 180 mL 0  . HYDROCHLOROTHIAZIDE 25 MG PO TABS Oral Take 25 mg by mouth daily.        BP 139/75  Pulse 83  Temp(Src) 98.9 F (37.2 C) (Oral)  Resp 18  SpO2 100%  Physical Exam  Constitutional: She appears well-developed and well-nourished.  HENT:  Head: Normocephalic.  Mouth/Throat: Oropharynx is clear and moist.       Nasal congestion  Neck: Normal range of motion. Neck supple. No thyromegaly present.  Cardiovascular: Normal rate and regular rhythm.   Pulmonary/Chest: Effort normal. No respiratory distress. She has no wheezes.       Constant congested cough  Skin: Skin is warm and dry.    ED Course  Procedures (including critical care time)  Labs Reviewed - No data to display No results found.   1. Bronchitis       MDM          Randa Spike, MD 04/25/11 1244

## 2011-04-25 NOTE — ED Notes (Signed)
Pt  Reports dry  Cough  Body  Aches      sorethroat     Sinus  Congestion       X  3-4  Days

## 2011-05-02 ENCOUNTER — Emergency Department (INDEPENDENT_AMBULATORY_CARE_PROVIDER_SITE_OTHER)
Admission: EM | Admit: 2011-05-02 | Discharge: 2011-05-02 | Disposition: A | Discharge status: Home / Self Care | Payer: Self-pay | Source: Home / Self Care | Attending: Emergency Medicine | Admitting: Emergency Medicine

## 2011-05-02 ENCOUNTER — Encounter (HOSPITAL_COMMUNITY): Payer: Self-pay | Admitting: Cardiology

## 2011-05-02 DIAGNOSIS — R05 Cough: Secondary | ICD-10-CM

## 2011-05-02 DIAGNOSIS — M25579 Pain in unspecified ankle and joints of unspecified foot: Secondary | ICD-10-CM

## 2011-05-02 DIAGNOSIS — R059 Cough, unspecified: Secondary | ICD-10-CM

## 2011-05-02 MED ORDER — PHENYLEPH-PROMETHAZINE-COD 5-6.25-10 MG/5ML PO SYRP: 5.0000 mL | ORAL_SOLUTION | Freq: Three times a day (TID) | ORAL | Status: DC | PRN

## 2011-05-02 MED ORDER — PREDNISONE 20 MG PO TABS: 20.0000 mg | ORAL_TABLET | Freq: Two times a day (BID) | ORAL | Status: AC

## 2011-05-02 MED ORDER — MELOXICAM 7.5 MG PO TABS: 7.5000 mg | ORAL_TABLET | Freq: Every day | ORAL | Status: AC

## 2011-05-02 NOTE — ED Notes (Signed)
Pt c/o cough for the past week. Productive cough yellow sputum. Denies fever. Pt noted to have scattered wheezes throughout lung fields R>L that clear somewhat with coughing.Left ankle pain chronic in nature from previous surgery 2001. Lateral side of left ankle noted to larger in size than right but no pedal edema noted. Palpable pedal pulse to left foot.

## 2011-05-02 NOTE — ED Provider Notes (Addendum)
History     CSN: [id number] Arrival date & time: 05/02/2011  8:13 AM   First MD Initiated Contact with Patient 05/02/11 [phone]      Chief Complaint  Patient presents with  . Cough  . Ankle Pain     HPI Comments: Still coughing, after taking the antibiotic and the other syrup, cough yellow and green phlegm at times, NO SOB, also my Left ankle its hurting and swollen, I have had surgery, of it  Have very bad cartilage tears, the orthopedic doctor told me  Patient is a 45 y.o. female presenting with cough and ankle pain. The history is provided by the patient.  Cough This is a recurrent problem. The current episode started more than 1 week ago. The problem occurs constantly. The cough is productive of sputum. There has been no fever. Associated symptoms include rhinorrhea. Pertinent negatives include no shortness of breath and no wheezing. She has tried decongestants and cough syrup for the symptoms. The treatment provided mild relief. She is not a smoker.  Ankle Pain     Past Medical History  Diagnosis Date  . Hypertension     Past Surgical History  Procedure Date  . Abdominal hysterectomy   . Fracture surgery   . 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 COPD Mother     History  Substance Use Topics  . Smoking status: Current Everyday Smoker -- 0.1 packs/day  . Smokeless tobacco: Not on file  . Alcohol Use: Yes     socialy    OB History    Grav Para Term Preterm Abortions TAB SAB Ect Mult Living                  Review of Systems  HENT: Positive for rhinorrhea.   Respiratory: Positive for cough. Negative for shortness of breath and wheezing.     Allergies  Review of patient's allergies indicates no known allergies.  Home Medications   Current Outpatient Rx  Name Route Sig Dispense Refill  . GUAIFENESIN-CODEINE 100-10 MG/5ML PO SYRP Oral Take 5 mLs by mouth every 6 (six) hours as needed for cough. 180  mL 0  . HYDROCHLOROTHIAZIDE 25 MG PO TABS Oral Take 25 mg by mouth daily.      . MELOXICAM 7.5 MG PO TABS Oral Take 1 tablet (7.5 mg total) by mouth daily. X 2 weeks 14 tablet 0  . PHENYLEPH-PROMETHAZINE-COD 5-6.25-10 MG/5ML PO SYRP Oral Take 5 mLs by mouth 3 (three) times daily as needed. 120 mL 0  . PREDNISONE 20 MG PO TABS Oral Take 1 tablet (20 mg total) by mouth 2 (two) times daily. X 5 days 10 tablet 0    BP 155/80  Pulse 100  Temp(Src) 98.2 F (36.8 C) (Oral)  Resp 22  SpO2 98%  Physical Exam  ED Course  Procedures (including critical care time)  Labs Reviewed - No data to display No results found.   1. Cough   2. Ankle arthralgia       MDM  Exacerbation L chronic ankle pain (No new injury) and ongoing acute cough.        Jimmie Molly, MD 05/02/11 1817  Jimmie Molly, MD 05/02/11 6440  Jimmie Molly, MD 05/02/11 [phone]

## 2011-10-21 ENCOUNTER — Other Ambulatory Visit: Payer: Self-pay | Admitting: Family Medicine

## 2011-10-21 DIAGNOSIS — Z1231 Encounter for screening mammogram for malignant neoplasm of breast: Secondary | ICD-10-CM

## 2011-11-07 ENCOUNTER — Ambulatory Visit: Payer: Self-pay

## 2011-11-10 ENCOUNTER — Emergency Department (HOSPITAL_COMMUNITY)
Admission: EM | Admit: 2011-11-10 | Discharge: 2011-11-10 | Disposition: A | Discharge status: Home Health | Payer: Medicaid Other | Source: Home / Self Care | Attending: Emergency Medicine | Admitting: Emergency Medicine

## 2011-11-10 ENCOUNTER — Encounter (HOSPITAL_COMMUNITY): Payer: Self-pay

## 2011-11-10 DIAGNOSIS — J209 Acute bronchitis, unspecified: Secondary | ICD-10-CM

## 2011-11-10 MED ORDER — ALBUTEROL SULFATE HFA 108 (90 BASE) MCG/ACT IN AERS: 1.0000 | INHALATION_SPRAY | Freq: Four times a day (QID) | RESPIRATORY_TRACT | Status: DC | PRN

## 2011-11-10 MED ORDER — AMOXICILLIN 500 MG PO CAPS: 1000.0000 mg | ORAL_CAPSULE | Freq: Three times a day (TID) | ORAL | Status: AC

## 2011-11-10 MED ORDER — PROMETHAZINE-CODEINE 6.25-10 MG/5ML PO SYRP: 5.0000 mL | ORAL_SOLUTION | ORAL | Status: AC | PRN

## 2011-11-10 NOTE — ED Provider Notes (Signed)
Chief Complaint  Patient presents with  . Cough    History of Present Illness:   Farin is a 45 year old female who has had a one-week history of cough productive of green sputum. She's had some wheezing and she is a cigarette smoker, but only about one pack per week. No history of asthma. She's had sweats, but no chills or fever. She also has nasal congestion but no drainage, headache, or sinus pressure. She has had a sore throat and been somewhat hoarse and had some loose stools. She saw her primary care physician last week and was given promethazine with codeine, but she's out of it right now.  Review of Systems:  Other than noted above, the patient denies any of the following symptoms. Systemic:  No fever, chills, sweats, fatigue, myalgias, headache, or anorexia. Eye:  No redness, pain or drainage. ENT:  No earache, ear congestion, nasal congestion, sneezing, rhinorrhea, sinus pressure, sinus pain, post nasal drip, or sore throat. Lungs:  No cough, sputum production, wheezing, shortness of breath, or chest pain. GI:  No abdominal pain, nausea, vomiting, or diarrhea. Skin:  No rash or itching.  PMFSH:  Past medical history, family history, social history, meds, and allergies were reviewed.  Physical Exam:   Vital signs:  BP 173/97  Pulse 93  Temp(Src) 98.3 F (36.8 C) (Oral)  Resp 16  SpO2 95% General:  Alert, in no distress. Eye:  No conjunctival injection or drainage. Lids were normal. ENT:  TMs and canals were normal, without erythema or inflammation.  Nasal mucosa was congested without drainage.  Mucous membranes were moist.  Pharynx was clear, without exudate or drainage.  There were no oral ulcerations or lesions. Neck:  Supple, no adenopathy, tenderness or mass. Lungs:  No respiratory distress.  Lungs were clear to auscultation, without wheezes, rales or rhonchi.  Breath sounds were clear and equal bilaterally. Lungs were resonant to percussion.  No egophony. Heart:  Regular  rhythm, without gallops, murmers or rubs. Skin:  Clear, warm, and dry, without rash or lesions.  Assessment:  The encounter diagnosis was Acute bronchitis.  Plan:   1.  The following meds were prescribed:   New Prescriptions   ALBUTEROL (PROVENTIL HFA;VENTOLIN HFA) 108 (90 BASE) MCG/ACT INHALER    Inhale 1-2 puffs into the lungs every 6 (six) hours as needed for wheezing.   AMOXICILLIN (AMOXIL) 500 MG CAPSULE    Take 2 capsules (1,000 mg total) by mouth 3 (three) times daily.   PROMETHAZINE-CODEINE (PHENERGAN WITH CODEINE) 6.25-10 MG/5ML SYRUP    Take 5 mLs by mouth every 4 (four) hours as needed for cough.   2.  The patient was instructed in symptomatic care and handouts were given. 3.  The patient was told to return if becoming worse in any way, if no better in 3 or 4 days, and given some red flag symptoms that would indicate earlier return.   Reuben Likes, MD 11/10/11 [phone]

## 2011-11-10 NOTE — ED Notes (Signed)
Pt c/o productive cough and general malaise for past week.  Pt given Rx for Promethazine with Codeine and was using with no relief.  Pt describes sputum as green/brown in color, denies fever.

## 2011-11-10 NOTE — Discharge Instructions (Signed)

## 2011-11-12 ENCOUNTER — Ambulatory Visit: Payer: Self-pay

## 2011-12-12 ENCOUNTER — Ambulatory Visit
Admission: RE | Admit: 2011-12-12 | Discharge: 2011-12-12 | Disposition: A | Discharge status: Home / Self Care | Payer: Medicaid Other | Source: Ambulatory Visit | Attending: Family Medicine | Admitting: Family Medicine

## 2011-12-12 ENCOUNTER — Other Ambulatory Visit: Payer: Self-pay | Admitting: Family Medicine

## 2011-12-12 DIAGNOSIS — T1490XA Injury, unspecified, initial encounter: Secondary | ICD-10-CM

## 2012-05-21 ENCOUNTER — Ambulatory Visit
Admission: RE | Admit: 2012-05-21 | Discharge: 2012-05-21 | Disposition: A | Discharge status: Home / Self Care | Payer: Medicaid Other | Source: Ambulatory Visit | Attending: Family Medicine | Admitting: Family Medicine

## 2012-05-21 DIAGNOSIS — Z1231 Encounter for screening mammogram for malignant neoplasm of breast: Secondary | ICD-10-CM

## 2013-01-26 ENCOUNTER — Emergency Department (HOSPITAL_COMMUNITY)
Admission: EM | Admit: 2013-01-26 | Discharge: 2013-01-26 | Disposition: A | Discharge status: Home / Self Care | Payer: Medicaid Other | Attending: Emergency Medicine | Admitting: Emergency Medicine

## 2013-01-26 ENCOUNTER — Encounter (HOSPITAL_COMMUNITY): Payer: Self-pay | Admitting: *Deleted

## 2013-01-26 DIAGNOSIS — L02212 Cutaneous abscess of back [any part, except buttock]: Secondary | ICD-10-CM

## 2013-01-26 DIAGNOSIS — Z79899 Other long term (current) drug therapy: Secondary | ICD-10-CM | POA: Insufficient documentation

## 2013-01-26 DIAGNOSIS — I10 Essential (primary) hypertension: Secondary | ICD-10-CM | POA: Insufficient documentation

## 2013-01-26 DIAGNOSIS — L02219 Cutaneous abscess of trunk, unspecified: Secondary | ICD-10-CM | POA: Insufficient documentation

## 2013-01-26 DIAGNOSIS — R059 Cough, unspecified: Secondary | ICD-10-CM | POA: Insufficient documentation

## 2013-01-26 DIAGNOSIS — R05 Cough: Secondary | ICD-10-CM | POA: Insufficient documentation

## 2013-01-26 DIAGNOSIS — F17200 Nicotine dependence, unspecified, uncomplicated: Secondary | ICD-10-CM | POA: Insufficient documentation

## 2013-01-26 NOTE — ED Provider Notes (Signed)
Medical screening examination/treatment/procedure(s) were performed by non-physician practitioner and as supervising physician I was immediately available for consultation/collaboration.   Karmela Bram, MD 01/26/13 0314 

## 2013-01-26 NOTE — ED Notes (Signed)
Pt states tonight noticed a "bump" on her upper back, states painful to touch, also states she has a cough and her mouth is dry.

## 2013-01-26 NOTE — ED Provider Notes (Signed)
CSN: [id number]     Arrival date & time 01/26/13  0039 History     First MD Initiated Contact with Patient 01/26/13 0055     Chief Complaint  Patient presents with  . Abscess  . Cough   HPI  History provided by the patient. The patient is a 47 year old female with history of hypertension who presents with concerns for pain and swelling of the skin to the upper back. Patient first began to notice some discomfort earlier this evening which have worsened. Her family noticed a red area that is swollen to the back and tender to palpation. Denies any bleeding or drainage. Denies having similar symptoms previously. Patient also mentions having a slight cough that developed today as well. She has been around family and young children who had been sick recently with cough and cold symptoms. Cough has been dry. Denies any nasal congestion, rhinorrhea, postnasal drip or sore throat. She has not used any medications for her cough symptoms or pain in her back. No other aggravating or alleviating factors. No other associated symptoms.    Past Medical History  Diagnosis Date  . Hypertension    Past Surgical History  Procedure Laterality Date  . Abdominal hysterectomy    . Fracture surgery    . Lung lobectomy  2010    right lower lobe   Family History  Problem Relation Age of Onset  . COPD Mother    History  Substance Use Topics  . Smoking status: Current Every Day Smoker -- 0.10 packs/day  . Smokeless tobacco: Not on file  . Alcohol Use: Yes     Comment: socialy   OB History   Grav Para Term Preterm Abortions TAB SAB Ect Mult Living                 Review of Systems  Constitutional: Negative for fever, chills and diaphoresis.  HENT: Negative for congestion, sore throat and rhinorrhea.   Respiratory: Positive for cough. Negative for shortness of breath.   Cardiovascular: Negative for chest pain.  Gastrointestinal: Negative for nausea and diarrhea.  All other systems reviewed and are  negative.    Allergies  Review of patient's allergies indicates no known allergies.  Home Medications   Current Outpatient Rx  Name  Route  Sig  Dispense  Refill  . citalopram (CELEXA) 20 MG tablet   Oral   Take 20 mg by mouth daily.         . hydrochlorothiazide (HYDRODIURIL) 25 MG tablet   Oral   Take 25 mg by mouth daily.           Marland Kitchen lurasidone (LATUDA) 40 MG TABS tablet   Oral   Take by mouth daily with breakfast.         . traMADol (ULTRAM) 50 MG tablet   Oral   Take 50 mg by mouth every 6 (six) hours as needed for pain.          BP 172/92  Pulse 93  Temp(Src) 98.5 F (36.9 C) (Oral)  Resp 20  Ht 5\' 8"  (1.727 m)  Wt 306 lb (138.801 kg)  BMI 46.54 kg/m2  SpO2 94% Physical Exam  Nursing note and vitals reviewed. Constitutional: She is oriented to person, place, and time. She appears well-developed and well-nourished. No distress.  HENT:  Head: Normocephalic.  Right Ear: Tympanic membrane normal.  Left Ear: Tympanic membrane normal.  Nose: Nose normal.  Mouth/Throat: Oropharynx is clear and moist.  Cardiovascular: Normal rate  and regular rhythm.   Pulmonary/Chest: Effort normal and breath sounds normal. No respiratory distress. She has no wheezes. She has no rales.  Abdominal: Soft.  Musculoskeletal: Normal range of motion.  Neurological: She is alert and oriented to person, place, and time.  Skin: Skin is warm and dry. No rash noted.  3 cm nodular area to the mid upper back with overlying erythema and induration of the skin. Bedside ultrasound confirming abscess.  Psychiatric: She has a normal mood and affect. Her behavior is normal.    ED Course   Procedures   INCISION AND DRAINAGE Performed by: Angus Seller Consent: Verbal consent obtained. Risks and benefits: risks, benefits and alternatives were discussed Type: abscess  Body area: MID upper back  Anesthesia: local infiltration  Incision was made with a scalpel.  Local  anesthetic: lidocaine 2% without epinephrine  Anesthetic total: 2 ml  Complexity: complex Blunt dissection to break up loculations  Drainage: purulent  Drainage amount: Moderate   Packing material: 1/4 in iodoform gauze  Patient tolerance: Patient tolerated the procedure well with no immediate complications.        1. Abscess of back   2. Cough     MDM  1:25 AM patient seen and evaluated. Patient appears well in no acute distress. Normal respirations O2 sats. Lungs clear. No significant coughing.  At this time patient does not wish to have chest x-ray for cough. Low clinical suspicion for pneumonia. I will recommend over-the-counter cough and cold medications and she will followup with PCP. She was given strict return precautions.  Angus Seller, PA-C 01/26/13 0157

## 2013-05-19 ENCOUNTER — Encounter (HOSPITAL_COMMUNITY): Payer: Self-pay | Admitting: Emergency Medicine

## 2013-05-19 ENCOUNTER — Emergency Department (HOSPITAL_COMMUNITY): Payer: Medicaid Other

## 2013-05-19 ENCOUNTER — Emergency Department (HOSPITAL_COMMUNITY)
Admission: EM | Admit: 2013-05-19 | Discharge: 2013-05-19 | Disposition: A | Discharge status: Home / Self Care | Payer: Medicaid Other | Attending: Emergency Medicine | Admitting: Emergency Medicine

## 2013-05-19 DIAGNOSIS — J069 Acute upper respiratory infection, unspecified: Secondary | ICD-10-CM

## 2013-05-19 DIAGNOSIS — I10 Essential (primary) hypertension: Secondary | ICD-10-CM | POA: Insufficient documentation

## 2013-05-19 DIAGNOSIS — F17200 Nicotine dependence, unspecified, uncomplicated: Secondary | ICD-10-CM | POA: Insufficient documentation

## 2013-05-19 DIAGNOSIS — Z79899 Other long term (current) drug therapy: Secondary | ICD-10-CM | POA: Insufficient documentation

## 2013-05-19 LAB — RAPID STREP SCREEN (MED CTR MEBANE ONLY): Streptococcus, Group A Screen (Direct): NEGATIVE

## 2013-05-19 MED ORDER — GUAIFENESIN-CODEINE 100-10 MG/5ML PO SOLN
10.0000 mL | Freq: Once | ORAL | Status: AC
  Administered 2013-05-19: 10 mL via ORAL
  Filled 2013-05-19: qty 10

## 2013-05-19 MED ORDER — IBUPROFEN 800 MG PO TABS
800.0000 mg | ORAL_TABLET | Freq: Once | ORAL | Status: AC
  Administered 2013-05-19: 800 mg via ORAL
  Filled 2013-05-19: qty 1

## 2013-05-19 MED ORDER — GUAIFENESIN-CODEINE 100-10 MG/5ML PO SOLN: 5.0000 mL | ORAL | Status: DC | PRN

## 2013-05-19 MED ORDER — IBUPROFEN 400 MG PO TABS: 400.0000 mg | ORAL_TABLET | Freq: Four times a day (QID) | ORAL | Status: DC | PRN

## 2013-05-19 NOTE — ED Notes (Signed)
Pt escorted to discharge window. Verbalized understanding discharge instructions. In no acute distress.   

## 2013-05-19 NOTE — ED Provider Notes (Signed)
Medical screening examination/treatment/procedure(s) were performed by non-physician practitioner and as supervising physician I was immediately available for consultation/collaboration.    Danielle Mink M Elai Vanwyk, MD 05/19/13 1756 

## 2013-05-19 NOTE — ED Notes (Signed)
Sore throat since 330am

## 2013-05-19 NOTE — ED Notes (Signed)
Pt alert and oriented x4. Respirations even and unlabored. Bilateral rise and fall of chest. Skin warm and dry. In no acute distress. Denies needs.  

## 2013-05-19 NOTE — ED Notes (Signed)
Patient transported to X-ray 

## 2013-05-19 NOTE — ED Provider Notes (Signed)
CSN: [id number]     Arrival date & time 05/19/13  0617 History   First MD Initiated Contact with Patient 05/19/13 [phone]     Chief Complaint  Patient presents with  . Sore Throat   (Consider location/radiation/quality/duration/timing/severity/associated sxs/prior Treatment) Patient is a 47 y.o. female presenting with pharyngitis. The history is provided by the patient.  Sore Throat This is a new problem. The current episode started today. The problem occurs constantly. The problem has been unchanged. Associated symptoms include coughing ( nonproductive) and a sore throat. Pertinent negatives include no abdominal pain, anorexia, chest pain, chills, fever, headaches, nausea, swollen glands or vomiting. Exacerbated by: coughing. She has tried nothing for the symptoms. The treatment provided no relief.    Past Medical History  Diagnosis Date  . Hypertension    Past Surgical History  Procedure Laterality Date  . Abdominal hysterectomy    . Fracture surgery    . Lung lobectomy  2010    right lower lobe   Family History  Problem Relation Age of Onset  . COPD Mother    History  Substance Use Topics  . Smoking status: Current Every Day Smoker -- 0.10 packs/day  . Smokeless tobacco: Not on file  . Alcohol Use: Yes     Comment: socialy   OB History   Grav Para Term Preterm Abortions TAB SAB Ect Mult Living                 Review of Systems  Constitutional: Negative for fever and chills.  HENT: Positive for sore throat. Negative for ear pain and rhinorrhea.   Eyes: Negative for pain.  Respiratory: Positive for cough ( nonproductive).   Cardiovascular: Negative for chest pain.  Gastrointestinal: Negative for nausea, vomiting, abdominal pain and anorexia.  Musculoskeletal: Negative for back pain.  Neurological: Negative for headaches.  Hematological: Negative for adenopathy.  Psychiatric/Behavioral: Negative for agitation.    Allergies  Review of patient's allergies indicates no  known allergies.  Home Medications   Current Outpatient Rx  Name  Route  Sig  Dispense  Refill  . citalopram (CELEXA) 20 MG tablet   Oral   Take 20 mg by mouth daily.         . hydrochlorothiazide (HYDRODIURIL) 25 MG tablet   Oral   Take 25 mg by mouth daily.           Marland Kitchen lurasidone (LATUDA) 40 MG TABS tablet   Oral   Take by mouth daily with breakfast.         . traMADol (ULTRAM) 50 MG tablet   Oral   Take 50 mg by mouth every 6 (six) hours as needed for pain.          BP 181/89  Pulse 101  Temp(Src) 99.4 F (37.4 C) (Oral)  Resp 21  SpO2 92% Physical Exam  Nursing note and vitals reviewed. Constitutional: She is oriented to person, place, and time. She appears well-developed and well-nourished.  HENT:  Head: Normocephalic and atraumatic.  Right Ear: External ear normal.  Left Ear: External ear normal.  Mouth/Throat: Uvula is midline and mucous membranes are normal. Posterior oropharyngeal erythema present. No oropharyngeal exudate, posterior oropharyngeal edema or tonsillar abscesses.  Eyes: Conjunctivae and EOM are normal.  Neck: Normal range of motion. Neck supple.  Cardiovascular: Normal rate, regular rhythm, normal heart sounds and intact distal pulses.   Pulmonary/Chest: Effort normal and breath sounds normal.  Abdominal: Soft. There is no tenderness.  Musculoskeletal: Normal range  of motion.  Lymphadenopathy:    She has no cervical adenopathy.  Neurological: She is alert and oriented to person, place, and time.  Skin: Skin is warm and dry.  Psychiatric: She has a normal mood and affect.    ED Course  Procedures (including critical care time) Labs Review Labs Reviewed  RAPID STREP SCREEN  CULTURE, GROUP A STREP   Imaging Review Dg Chest 2 View  05/19/2013   CLINICAL DATA:  Cough  EXAM: CHEST  2 VIEW  COMPARISON:  11/22/2010  FINDINGS: Cardiac enlargement without heart failure. Surgical clip overlying the right hilum unchanged. Negative for mass  lesion. Negative for heart failure or pneumonia. Lungs are clear. Slight right lower lobe scarring unchanged.  IMPRESSION: No active cardiopulmonary disease.   Electronically Signed   By: Marlan Palau M.D.   On: 05/19/2013 07:29    EKG Interpretation   None       MDM   1. Viral URI with cough    47 yo female with acute onset of sore throat and cough during the night. Afebrile and nontoxic appearing. Rapid strep neg, doubt strep pharyngitis. CXR neg, no evidence of pneumonia. O2 sat rechecked and is 99% on RA indicating excellent oxygenation. Clinical picture c/w URI, probable viral origin. Recommend symptomatic treatment and f/u with PCP. Strict return instructions discussed and provided to the patient in writing at time of d/c.      Simmie Davies, NP 05/19/13 [phone]

## 2013-05-21 LAB — CULTURE, GROUP A STREP

## 2013-07-23 ENCOUNTER — Other Ambulatory Visit: Payer: Self-pay

## 2013-07-23 DIAGNOSIS — Z1231 Encounter for screening mammogram for malignant neoplasm of breast: Secondary | ICD-10-CM

## 2013-07-27 ENCOUNTER — Ambulatory Visit (HOSPITAL_BASED_OUTPATIENT_CLINIC_OR_DEPARTMENT_OTHER): Payer: Medicaid Other | Attending: Physician Assistant

## 2013-07-27 VITALS — Ht 68.0 in | Wt 325.0 lb

## 2013-07-27 DIAGNOSIS — Z9989 Dependence on other enabling machines and devices: Secondary | ICD-10-CM

## 2013-07-27 DIAGNOSIS — G4733 Obstructive sleep apnea (adult) (pediatric): Secondary | ICD-10-CM | POA: Insufficient documentation

## 2013-07-31 DIAGNOSIS — G4733 Obstructive sleep apnea (adult) (pediatric): Secondary | ICD-10-CM | POA: Diagnosis not present

## 2013-07-31 NOTE — Sleep Study (Signed)
   NAME: [name] DATE OF BIRTH:  01-11-1966 MEDICAL RECORD NUMBER [id number]  LOCATION: [facility]  PHYSICIAN: YOUNG,CLINTON D  DATE OF STUDY: 07/27/2013  SLEEP STUDY TYPE: Nocturnal Polysomnogram               REFERRING PHYSICIAN: Redmon, Noelle, PA-C  INDICATION FOR STUDY: A hypersomnia with sleep apnea  EPWORTH SLEEPINESS SCORE:   HEIGHT: 5\' 8"  (172.7 cm)  WEIGHT: 325 lb (147.419 kg)    Body mass index is 49.43 kg/(m^2).  NECK SIZE: 18 in.  MEDICATIONS: Charted for review  SLEEP ARCHITECTURE: The study protocol. During the diagnostic phase total sleep time 121 minutes with sleep efficiency 94.5%. Stage I was 17.8%, stage II 81.4%, stage III 0.8%, REM absent. Sleep latency 1 minute, awake after sleep onset 6 minutes, arousal index 9.9. Bedtime medication: None  RESPIRATORY DATA: Apnea hypopneas index (AHI) 79.3 per hour. 160 events scored including 8 obstructive apneas, 1 central apnea, 151 hypopneas. Events were more common while supine. REM AHI N/A. CPAP was titrated to 14 CWP, AHI 1.2 per hour. She wore a large [employer] FFM with heated humidifier.  OXYGEN DATA: Very loud snoring before CPAP with oxygen desaturation to a nadir of 69% on room air. With CPAP control, snoring was prevented and mean oxygen saturation held at 92.5% on room air.  CARDIAC DATA: Sinus rhythm with PACs  MOVEMENT/PARASOMNIA: No significant motor disturbance, bathroom x1  IMPRESSION/ RECOMMENDATION:   1) Severe obstructive sleep apnea/hypopneas syndrome, AHI 79.3 per hour with events in all positions, especially supine. Very loud snoring with oxygen desaturation to a nadir of 69% on room air. 2) Successful CPAP titration to 14 CWP, AHI 1.2 per hour. She wore a large [employer] Simplus fullface mask with heated humidifier and an EPR of 3. Snoring was prevented and mean oxygen saturation held 92.5% on room air.    Signed Baird Lyons M.D. [name] Diplomate, American Board of Sleep Medicine  ELECTRONICALLY SIGNED ON:  07/31/2013, 10:52 AM [place] PH: [phone]   FX: [phone] [place]

## 2013-08-06 ENCOUNTER — Ambulatory Visit
Admission: RE | Admit: 2013-08-06 | Discharge: 2013-08-06 | Disposition: A | Discharge status: Home / Self Care | Payer: Medicaid Other | Source: Ambulatory Visit

## 2013-08-06 DIAGNOSIS — Z1231 Encounter for screening mammogram for malignant neoplasm of breast: Secondary | ICD-10-CM

## 2013-09-22 ENCOUNTER — Emergency Department (HOSPITAL_COMMUNITY): Payer: Medicaid Other

## 2013-09-22 ENCOUNTER — Emergency Department (HOSPITAL_COMMUNITY)
Admission: EM | Admit: 2013-09-22 | Discharge: 2013-09-22 | Disposition: A | Discharge status: Home / Self Care | Payer: Medicaid Other | Attending: Emergency Medicine | Admitting: Emergency Medicine

## 2013-09-22 ENCOUNTER — Encounter (HOSPITAL_COMMUNITY): Payer: Self-pay | Admitting: Emergency Medicine

## 2013-09-22 DIAGNOSIS — J189 Pneumonia, unspecified organism: Secondary | ICD-10-CM

## 2013-09-22 DIAGNOSIS — I10 Essential (primary) hypertension: Secondary | ICD-10-CM | POA: Insufficient documentation

## 2013-09-22 DIAGNOSIS — Z8619 Personal history of other infectious and parasitic diseases: Secondary | ICD-10-CM | POA: Insufficient documentation

## 2013-09-22 DIAGNOSIS — Z87891 Personal history of nicotine dependence: Secondary | ICD-10-CM | POA: Insufficient documentation

## 2013-09-22 DIAGNOSIS — Z79899 Other long term (current) drug therapy: Secondary | ICD-10-CM | POA: Insufficient documentation

## 2013-09-22 DIAGNOSIS — R000 Tachycardia, unspecified: Secondary | ICD-10-CM | POA: Insufficient documentation

## 2013-09-22 DIAGNOSIS — J159 Unspecified bacterial pneumonia: Secondary | ICD-10-CM | POA: Insufficient documentation

## 2013-09-22 HISTORY — DX: Aspergillosis, unspecified: B44.9

## 2013-09-22 LAB — BASIC METABOLIC PANEL
BUN: 14 mg/dL (ref 6–23)
CO2: 28 meq/L (ref 19–32)
Calcium: 9.2 mg/dL (ref 8.4–10.5)
Chloride: 97 mEq/L (ref 96–112)
Creatinine, Ser: 0.72 mg/dL (ref 0.50–1.10)
GFR calc Af Amer: >90 mL/min (ref >90)
GFR calc non Af Amer: >90 mL/min (ref >90)
GLUCOSE: 136 mg/dL — AB (ref 70–99)
POTASSIUM: 3.8 meq/L (ref 3.7–5.3)
Sodium: 136 mEq/L — ABNORMAL LOW (ref 137–147)

## 2013-09-22 LAB — CBC
HCT: 39.7 % (ref 36.0–46.0)
HEMOGLOBIN: 13 g/dL (ref 12.0–15.0)
MCH: 29.7 pg (ref 26.0–34.0)
MCHC: 32.7 g/dL (ref 30.0–36.0)
MCV: 90.8 fL (ref 78.0–100.0)
Platelets: 327 10*3/uL (ref 150–400)
RBC: 4.37 MIL/uL (ref 3.87–5.11)
RDW: 13.8 % (ref 11.5–15.5)
WBC: 12.8 10*3/uL — AB (ref 4.0–10.5)

## 2013-09-22 LAB — TROPONIN I: Troponin I: <0.3 ng/mL (ref <0.30)

## 2013-09-22 MED ORDER — ASPIRIN 325 MG PO TABS: 325.0000 mg | ORAL_TABLET | ORAL | Status: DC

## 2013-09-22 MED ORDER — OXYCODONE-ACETAMINOPHEN 5-325 MG PO TABS
1.0000 | ORAL_TABLET | Freq: Once | ORAL | Status: AC
  Administered 2013-09-22: 1 via ORAL
  Filled 2013-09-22: qty 1

## 2013-09-22 MED ORDER — IBUPROFEN 600 MG PO TABS: 600.0000 mg | ORAL_TABLET | Freq: Four times a day (QID) | ORAL | Status: DC | PRN

## 2013-09-22 MED ORDER — ASPIRIN 81 MG PO CHEW
324.0000 mg | CHEWABLE_TABLET | Freq: Once | ORAL | Status: AC
  Administered 2013-09-22: 324 mg via ORAL
  Filled 2013-09-22: qty 4

## 2013-09-22 MED ORDER — AZITHROMYCIN 250 MG PO TABS: 250.0000 mg | ORAL_TABLET | Freq: Every day | ORAL | Status: DC

## 2013-09-22 MED ORDER — IBUPROFEN 200 MG PO TABS
600.0000 mg | ORAL_TABLET | Freq: Once | ORAL | Status: AC
  Administered 2013-09-22: 600 mg via ORAL
  Filled 2013-09-22: qty 3

## 2013-09-22 NOTE — ED Provider Notes (Signed)
CSN: [id number]     Arrival date & time 09/22/13  0355 History   First MD Initiated Contact with Patient 09/22/13 [phone]     Chief Complaint  Patient presents with  . Shortness of Breath  . Chest Pain    left sided     (Consider location/radiation/quality/duration/timing/severity/associated sxs/prior Treatment) HPI Comments: Pt comes in with cc of dib, cough, chest pains. Pt has hx of HTN, aspergillosis. States that her chest pain is located in the lower left side and radiates to her back. She is coughing, mainly dry. She also has some dib. Pt is morbidly obese. No hx of PE, DVT and no risk factors for the same. No CAD hx, and denies COPD or asthma hx.  Patient is a 48 y.o. female presenting with shortness of breath and chest pain. The history is provided by the patient.  Shortness of Breath Associated symptoms: chest pain and cough   Associated symptoms: no abdominal pain, no headaches, no neck pain and no vomiting   Chest Pain Associated symptoms: cough and shortness of breath   Associated symptoms: no abdominal pain, no headache, no nausea and not vomiting     Past Medical History  Diagnosis Date  . Hypertension   . Aspergillosis    Past Surgical History  Procedure Laterality Date  . Abdominal hysterectomy    . Fracture surgery    . Lung lobectomy  2010    right lower lobe   Family History  Problem Relation Age of Onset  . COPD Mother    History  Substance Use Topics  . Smoking status: Former Smoker    Types: Cigarettes    Quit date: 07/25/2013  . Smokeless tobacco: Not on file  . Alcohol Use: Yes     Comment: socialy   OB History   Grav Para Term Preterm Abortions TAB SAB Ect Mult Living                 Review of Systems  Constitutional: Negative for activity change.  Respiratory: Positive for cough and shortness of breath.   Cardiovascular: Positive for chest pain.  Gastrointestinal: Negative for nausea, vomiting and abdominal pain.  Genitourinary: Negative  for dysuria.  Musculoskeletal: Negative for neck pain.  Neurological: Negative for headaches.  All other systems reviewed and are negative.     Allergies  Review of patient's allergies indicates no known allergies.  Home Medications   Current Outpatient Rx  Name  Route  Sig  Dispense  Refill  . citalopram (CELEXA) 20 MG tablet   Oral   Take 20 mg by mouth daily.         . cyclobenzaprine (FLEXERIL) 10 MG tablet   Oral   Take 10 mg by mouth 3 (three) times daily as needed for muscle spasms.         . hydrochlorothiazide (HYDRODIURIL) 25 MG tablet   Oral   Take 25 mg by mouth daily.           Marland Kitchen ibuprofen (ADVIL,MOTRIN) 400 MG tablet   Oral   Take 400 mg by mouth every 6 (six) hours as needed for moderate pain.         Marland Kitchen lurasidone (LATUDA) 40 MG TABS tablet   Oral   Take by mouth daily with breakfast.         . traMADol (ULTRAM) 50 MG tablet   Oral   Take 50 mg by mouth every 6 (six) hours as needed for pain.         Marland Kitchen  azithromycin (ZITHROMAX) 250 MG tablet   Oral   Take 1 tablet (250 mg total) by mouth daily. Take first 2 tablets together, then 1 every day until finished.   6 tablet   0   . ibuprofen (ADVIL,MOTRIN) 600 MG tablet   Oral   Take 1 tablet (600 mg total) by mouth every 6 (six) hours as needed.   30 tablet   0    BP 146/64  Pulse 98  Temp(Src) 98.2 F (36.8 C) (Oral)  Resp 20  Ht 5\' 8"  (1.727 m)  Wt 310 lb (140.615 kg)  BMI 47.15 kg/m2  SpO2 95% Physical Exam  Nursing note and vitals reviewed. Constitutional: She is oriented to person, place, and time. She appears well-developed and well-nourished.  HENT:  Head: Normocephalic and atraumatic.  Eyes: EOM are normal. Pupils are equal, round, and reactive to light.  Neck: Neck supple.  Cardiovascular: Normal rate, regular rhythm and normal heart sounds.   No murmur heard. Pulmonary/Chest: Effort normal. No respiratory distress. She has rales.  Left sided rales and rhonchi   Abdominal: Soft. She exhibits no distension. There is no tenderness. There is no rebound and no guarding.  Neurological: She is alert and oriented to person, place, and time.  Skin: Skin is warm and dry.    ED Course  Procedures (including critical care time) Labs Review Labs Reviewed  CBC - Abnormal; Notable for the following:    WBC 12.8 (*)    All other components within normal limits  BASIC METABOLIC PANEL - Abnormal; Notable for the following:    Sodium 136 (*)    Glucose, Bld 136 (*)    All other components within normal limits  TROPONIN I   Imaging Review Dg Chest 2 View  09/22/2013   CLINICAL DATA:  Mid to left-sided chest pain for 1 day. History of smoking.  EXAM: CHEST  2 VIEW  COMPARISON:  Chest radiograph performed 05/19/2013  FINDINGS: The lungs are well-aerated. Mild vascular congestion is noted. Mild left basilar airspace opacity may reflect atelectasis or possibly mild pneumonia. There is no evidence of pleural effusion or pneumothorax.  The heart is borderline enlarged. No acute osseous abnormalities are seen.  IMPRESSION: Mild vascular congestion and borderline cardiomegaly noted. Mild left basilar airspace opacity may reflect atelectasis or possibly mild pneumonia.   Electronically Signed   By: Garald Balding M.D.   On: 09/22/2013 05:39     EKG Interpretation   Date/Time:  Wednesday September 22 2013 04:01:54 EDT Ventricular Rate:  109 PR Interval:  160 QRS Duration: 98 QT Interval:  357 QTC Calculation: 481 R Axis:   4 Text Interpretation:  Sinus tachycardia Confirmed by Kathrynn Humble, MD, Thelma Comp  [phone]) on 09/22/2013 4:05:44 AM      MDM   Final diagnoses:  CAP (community acquired pneumonia)    Pt comes in with left sided pleuritic chest pain and has some abnml lung sounds on that side. CXR shows CAP.  She is noted to be tachycardic, and i asked her screening questions for PE, DVT - and outside of her morbid obesity, she has no other risk factors. She is  appearing comfortable, no hypoxia - so i doubt that she had a big infarct from a PE that is showing up on the CXR. Despite that, we have advised her to see her pcp in 1 week and return to the ER if there is increased dib, fevers, syncope.  Pt agrees with the plan.  Varney Biles, MD  09/22/13 0818 

## 2013-09-22 NOTE — Discharge Instructions (Signed)
We saw you in the ER for the chest pain, shortness of breath. Results show that you have an infection on the left side of your lungs. Take the medications provided. If not better, since the workup in the ER is not complete, and is limited to screening for life threatening and emergent conditions only, see a primary care doctor for further evaluation.   Pneumonia, Adult Pneumonia is an infection of the lungs. It may be caused by a germ (virus or bacteria). Some types of pneumonia can spread easily from person to person. This can happen when you cough or sneeze. HOME CARE  Only take medicine as told by your doctor.  Take your medicine (antibiotics) as told. Finish it even if you start to feel better.  Do not smoke.  You may use a vaporizer or humidifier in your room. This can help loosen thick spit (mucus).  Sleep so you are almost sitting up (semi-upright). This helps reduce coughing.  Rest. A shot (vaccine) can help prevent pneumonia. Shots are often advised for:  People over 44 years old.  Patients on chemotherapy.  People with long-term (chronic) lung problems.  People with immune system problems. GET HELP RIGHT AWAY IF:   You are getting worse.  You cannot control your cough, and you are losing sleep.  You cough up blood.  Your pain gets worse, even with medicine.  You have a fever.  Any of your problems are getting worse, not better.  You have shortness of breath or chest pain. MAKE SURE YOU:   Understand these instructions.  Will watch your condition.  Will get help right away if you are not doing well or get worse. Document Released: 11/20/2007 Document Revised: 08/26/2011 Document Reviewed: 08/24/2010 [facility] Patient Information 2014 [place].

## 2013-09-22 NOTE — ED Notes (Signed)
Patient c/o chest pain, sob. Onset yesterday

## 2017-09-10 ENCOUNTER — Encounter (HOSPITAL_COMMUNITY): Payer: Self-pay | Admitting: Emergency Medicine

## 2017-09-10 ENCOUNTER — Ambulatory Visit (HOSPITAL_COMMUNITY)
Admission: EM | Admit: 2017-09-10 | Discharge: 2017-09-10 | Disposition: A | Discharge status: Home / Self Care | Payer: Self-pay | Attending: Family Medicine | Admitting: Family Medicine

## 2017-09-10 DIAGNOSIS — M25562 Pain in left knee: Secondary | ICD-10-CM

## 2017-09-10 DIAGNOSIS — K649 Unspecified hemorrhoids: Secondary | ICD-10-CM

## 2017-09-10 DIAGNOSIS — G8929 Other chronic pain: Secondary | ICD-10-CM

## 2017-09-10 MED ORDER — HYDROCORTISONE ACETATE 25 MG RE SUPP: 25.0000 mg | Freq: Two times a day (BID) | RECTAL | 0 refills | Status: AC

## 2017-09-10 MED ORDER — METHYLPREDNISOLONE ACETATE 80 MG/ML IJ SUSP
INTRAMUSCULAR | Status: AC
  Filled 2017-09-10: qty 1

## 2017-09-10 MED ORDER — DICLOFENAC SODIUM 1 % TD GEL: 2.0000 g | Freq: Four times a day (QID) | TRANSDERMAL | 1 refills | Status: AC

## 2017-09-10 MED ORDER — METHYLPREDNISOLONE ACETATE 40 MG/ML IJ SUSP
INTRAMUSCULAR | Status: AC
  Filled 2017-09-10: qty 1

## 2017-09-10 MED ORDER — METHYLPREDNISOLONE ACETATE 80 MG/ML IJ SUSP
120.0000 mg | Freq: Once | INTRAMUSCULAR | Status: AC
  Administered 2017-09-10: 120 mg via INTRAMUSCULAR
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
## 2017-09-10 NOTE — Discharge Instructions (Addendum)
Please follow-up with orthopedics or establish care with primary care provider for further treatment and evaluation of your knee.  We gave you a shot of Depo-Medrol today.  This is a steroid, will likely increase your blood sugar.  Please use Voltaren cream on knee every 6 hours as needed.  You may take Aleve orally.  I have also provided you with Anusol suppository to help with your hemorrhoids.

## 2017-09-10 NOTE — ED Triage Notes (Signed)
Pt here for left knee pain and pain from hemorrhoids

## 2017-09-29 ENCOUNTER — Ambulatory Visit (INDEPENDENT_AMBULATORY_CARE_PROVIDER_SITE_OTHER): Payer: Self-pay | Admitting: Family Medicine

## 2017-09-29 ENCOUNTER — Ambulatory Visit: Payer: Self-pay

## 2017-09-29 VITALS — BP 137/67 | Ht 68.0 in | Wt 338.0 lb

## 2017-09-29 DIAGNOSIS — G8929 Other chronic pain: Secondary | ICD-10-CM

## 2017-09-29 DIAGNOSIS — M25562 Pain in left knee: Principal | ICD-10-CM

## 2017-09-29 DIAGNOSIS — M25561 Pain in right knee: Principal | ICD-10-CM

## 2017-09-29 DIAGNOSIS — M170 Bilateral primary osteoarthritis of knee: Secondary | ICD-10-CM

## 2017-09-29 MED ORDER — METHYLPREDNISOLONE ACETATE 40 MG/ML IJ SUSP
40.0000 mg | Freq: Once | INTRAMUSCULAR | Status: AC
  Administered 2017-09-29: 40 mg via INTRA_ARTICULAR

## 2017-09-29 NOTE — Progress Notes (Signed)
    CHIEF COMPLAINT / HPI: Bilateral but left greater than right knee pain.  Over a year ago she fell landing on both knees on concrete floor.  At the time she was incarcerated.  Did not have x-rays.  Several months later they ended up doing an MRI on the right knee and subsequently she had a corticosteroid injection last October which significantly improved her pain.  The right knee still bothers her but pain is at a 1 or 2 out of 10 whereas the left knee is 7 out of 10.  Worse with stair climbing.  Had no prior problems with either knee before her fall.  After the fall, she reports she was in a wheelchair for several weeks before she was able to walk.  She has not had a physical therapy.  REVIEW OF SYSTEMS: No other unusual arthralgias.  No unusual weight gain recently.  No fever.  No numbness or tingling in her lower extremities.  No lower extremity edema, no chest pain or shortness of breath with exertion.  PERTINENT  PMH / PSH: I have reviewed the patient's medications, allergies, past medical and surgical history, smoking status and updated in the EMR as appropriate. Diabetes mellitus Obesity Depressive disorder Hypertension Smoker current  OBJECTIVE: GENERAL: Well-developed obese female no acute distress GAIT: Antalgic limping on the left.  Shortened stride length bilaterally. KNEES: Full range of motion flexion extension, no crepitus.  Mild pain with the last 10-20 degrees of extension on the left.  Medial joint line tenderness bilaterally, worse on the left.  The left knee also exhibits lateral joint line tenderness.  Neither knee is warm or unusually red.  There is a small trace effusion on the left knee, none on the right.  Bilaterally the popliteal space is benign.  Bilaterally the calf is soft. VASCULAR: Dorsalis pedis pulses are 2+ bilaterally symmetrical NEURO: Intact sensation to soft touch bilaterally from the knee to the foot.  ULTRASOUND: Right knee ultrasound reveals no  fluid in the suprapatellar pouch.  Patellar and quadriceps tendons are intact.  Lateral meniscus is not well seen and there are a few osteophytes located at the joint line.  The right medial meniscus is seen and appears normal. Left knee: Small amount of fluid in the suprapatellar pouch.  Quadriceps and patellar tendon are intact.  Lateral meniscus is normal and irregular in shape however there is some fluid noted at the joint line.  The medial meniscus is somewhat prominent and bulging with significant fluid noted in the joint line.  PROCEDURE: INJECTION: Patient was given informed consent, signed copy in the chart. Appropriate time out was taken. Area prepped and draped in usual sterile fashion. Ethyl chloride was  used for local anesthesia. A 21 gauge 1 1/2 inch needle was used.. 1 cc of methylprednisolone 40 mg/ml plus 4 cc of 1% lidocaine without epinephrine was injected into the left knee using a(n) anterior medial approach.   The patient tolerated the procedure well. There were no complications. Post procedure instructions were given.   ASSESSMENT / PLAN: LEFT CSI knee

## 2017-09-30 ENCOUNTER — Encounter: Payer: Self-pay | Admitting: Family Medicine

## 2017-09-30 DIAGNOSIS — M170 Bilateral primary osteoarthritis of knee: Secondary | ICD-10-CM | POA: Insufficient documentation

## 2017-09-30 NOTE — Assessment & Plan Note (Signed)
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  Currently the left one is most symptomatic.  Corticosteroid injection today.  If it does not improve in the next 2-3 weeks, she needs to return to clinic.  We discussed intermittent treatment with corticosteroid injections and timing.  At some point it would be nice to get standing bilateral knee x-rays.  She is actively enrolled into the [facility] and attending water aerobics and some gym activities.  We discussed activities to avoid specifically full leg extension, squat.  Discussed weight loss.  Return to clinic as needed.

## 2017-10-17 ENCOUNTER — Ambulatory Visit (INDEPENDENT_AMBULATORY_CARE_PROVIDER_SITE_OTHER): Payer: Self-pay | Admitting: Family Medicine

## 2017-10-17 VITALS — BP 139/71 | Ht 68.0 in | Wt 338.0 lb

## 2017-10-17 DIAGNOSIS — M25572 Pain in left ankle and joints of left foot: Secondary | ICD-10-CM

## 2017-10-17 DIAGNOSIS — G8929 Other chronic pain: Secondary | ICD-10-CM

## 2017-10-17 NOTE — Progress Notes (Signed)
[place] Attending Note: I have seen and examined this patient. I have discussed this patient with the resident and reviewed the assessment and plan as documented above. I agree with the resident's findings and plan.  Pes planus, increased subtalar motion with gait. Would benefiit from custom molded orthotics which we can plan for fuutre. [place] lso consider CSI ankle once we get mechanical issues stabilized with orthotics.

## 2017-10-17 NOTE — Patient Instructions (Signed)
Make appt for orthotics BRING YOUR GYM SHOES / TENNIS SHOES TO THAT APPT Do ankle exercises
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
## 2017-10-24 ENCOUNTER — Ambulatory Visit (INDEPENDENT_AMBULATORY_CARE_PROVIDER_SITE_OTHER): Payer: Self-pay | Admitting: Family Medicine

## 2017-10-24 ENCOUNTER — Encounter: Payer: Self-pay | Admitting: Family Medicine

## 2017-10-24 DIAGNOSIS — M25572 Pain in left ankle and joints of left foot: Secondary | ICD-10-CM

## 2017-10-24 DIAGNOSIS — G8929 Other chronic pain: Secondary | ICD-10-CM

## 2017-10-24 DIAGNOSIS — M19079 Primary osteoarthritis, unspecified ankle and foot: Secondary | ICD-10-CM | POA: Insufficient documentation

## 2017-10-24 DIAGNOSIS — M2141 Flat foot [pes planus] (acquired), right foot: Secondary | ICD-10-CM | POA: Insufficient documentation

## 2017-10-24 DIAGNOSIS — M2142 Flat foot [pes planus] (acquired), left foot: Secondary | ICD-10-CM | POA: Insufficient documentation

## 2017-10-24 NOTE — Assessment & Plan Note (Signed)
See plan above.

## 2017-10-24 NOTE — Assessment & Plan Note (Signed)
Patient is here with chronic left ankle pain likely secondary to pes planus deformity and long-standing tibiotalar "wobble" with ambulation. -Custom orthotics provided today.  Patient ambulated in these and expressed their comfort. -Follow-up PRN

## 2017-10-24 NOTE — Progress Notes (Signed)
   HPI  CC: Pes planus and chronic left ankle pain Patient is here to follow-up regarding her chronic left ankle pain and pes planus.  She was last seen last week for the same issue.  At that time it was felt appropriate to have her come back to have custom orthotics made.  She denies any new or worsening pain.  No recent injury, or trauma since last visit.  Pain persists along the medial and lateral ankle which is worse with prolonged standing and mobility.  Medications/Interventions Tried: Status post 2 arthroscopies left ankle.  See HPI and/or previous note for associated ROS.  Objective: BP 140/71   Ht 5\' 8"  (1.727 m)   Wt (!) 338 lb (153.3 kg)   BMI 51.39 kg/m  Gen: NAD, well groomed, a/o x3, normal affect.  CV: Well-perfused. Warm.  Resp: Non-labored.  Neuro: Sensation intact throughout. No gross coordination deficits.  Gait: Nonpathologic posture, unremarkable stride without signs of limp or balance issues. Ankle/Foot, Bilateral: TTP noted at the medial and lateral aspect of the midfoot and tibiotalar joint of the left ankle. No visible erythema, swelling, ecchymosis, or bony deformity.  Well-healed surgical incision sites.  Notable pes planus deformity bilaterally (R > L).  Left-sided tibiotalar valgus deviation; right sided first ray pronation.  Range of motion is full in all directions. Strength is 5/5 in all directions. No peroneal tendon tenderness or subluxation; Stable lateral and medial ligaments; No plantar calcaneal tenderness; No tenderness over the navicular prominence; No tenderness over cuboid; No pain at base of 5th MT; No tenderness at the distal metatarsals; Able to walk 4 steps.   Custom Orthotics: - Patient was fitted for a standard, cushioned, semi-rigid orthotic. - Orthotic was heated and afterward the patient stood on the orthotic blank positioned on the orthotic stand. - The patient was positioned in subtalar neutral position and 10 degrees of ankle  dorsiflexion in a weight bearing stance. - After completion of molding, a stable base was applied to the orthotic blank. - The blank was ground to a stable position for weight bearing. - Size: 10 - Base: Blue EVA - Additional Posting and Padding: none - The patient ambulated in these, and they were very comfortable.   Assessment and plan:  Bilateral pes planus Patient is here with chronic left ankle pain likely secondary to pes planus deformity and long-standing tibiotalar "wobble" with ambulation. -Custom orthotics provided today.  Patient ambulated in these and expressed their comfort. -Follow-up PRN  Chronic pain of left ankle See plan above.   I spent 30 minutes with this patient. Over 50% of visit was spent in counseling and coordination of care for problems with pes planus and chronic left ankle pain.   Elberta Leatherwood, MD,MS [facility] 10/24/2017 12:21 PM

## 2017-10-27 NOTE — Progress Notes (Signed)
[facility]: Attending Note: I have reviewed the chart, discussed wit the Sports Medicine Fellow. I agree with assessment and treatment plan as detailed in the Fellow's note.  

## 2017-11-18 ENCOUNTER — Encounter: Payer: Self-pay | Admitting: Family Medicine

## 2017-11-24 DIAGNOSIS — E119 Type 2 diabetes mellitus without complications: Secondary | ICD-10-CM | POA: Insufficient documentation

## 2017-11-24 NOTE — Progress Notes (Signed)
Subjective:   Patient ID: [name]    DOB: May 18, 1966, 52 y.o. female   MRN: [id number]  [name] is a 52 y.o. female with a history of OA, obesity here for   Establish Care - [place] and medications reviewed - follows with Sports Med for chronic L ankle pain and pes planus, OA in knees. Received steroid injection about a month ago. - had mammogram on Friday at [facility] - due for colonoscopy, pap smear (last in 2016) - goes to [facility] - PTSD, severe MDD, bipolar d/o, schizophrenia, anxiety per patient report - has h/o sleep apnea but does not currently have CPAP machine - recently released from prison in 06/2017 after 3 year sentence.  Diabetes, Type 2 - Last A1c thinks it was 7.2 in 06/2017 - Medications: metformin 1000mg  BID, states 500mg  am 1000mg  pm - Compliance: good - Checking BG at home: no - Diet: 24 hr recall - breakfast/lunch around noon (nuts, half of a baked potato); dinner - cheeseburger casserole with lettuce and onions, crueller donut. Drinks water. Snacks with nuts. - Exercise: none right now, has membership at [employer], likes to go swimming, but has transportation issues - Eye exam: 10/20/17, [facility], glaucoma, takes drops - Foot exam: due  - Microalbumin: due - Denies symptoms of hypoglycemia, foot ulcers/trauma - States sometimes has numbness in hands, polyuria, polydipsia (don't know if because it's hot out)  Hypertension: - Medications: HCTZ 25mg , atenolol 50mg  - Compliance: good - Checking BP at home: no - Denies any SOB, CP, vision changes, LE edema, medication SEs, or symptoms of hypotension - Diet and exercise: see above  Review of Systems:  Per HPI.   [place], medications and smoking status reviewed.  Objective:   BP 112/62   Pulse 75   Temp 98.2 F (36.8 C) (Oral)   Ht 5\' 8"  (1.727 m)   Wt (!) 331 lb (150.1 kg)   SpO2 95%   BMI 50.33 kg/m  Vitals and nursing note reviewed.  General: morbidly obese female, in no acute distress with  non-toxic appearance HEENT: normocephalic, atraumatic, moist mucous membranes CV: regular rate and rhythm without murmurs, rubs, or gallops Lungs: clear to auscultation bilaterally with normal work of breathing Skin: warm, dry, no rashes or lesions Extremities: warm and well perfused, normal tone MSK: ROM grossly intact, strength intact, gait normal Neuro: Alert and oriented, speech normal  Assessment & Plan:   Diabetes (HCC) A1c 7.0 today. No medication changes made today. Discussed diet, exercise and weight loss. Can consider nutritional referral at future visit. Discussed healthy plate and diabetic diet, handout provided. Foot exam completed today.  Severe obesity (BMI >= 40) (HCC) Discussed need for diet, exercise, weight loss to promote better health. Believe this will not only help to control BG, aid in sleep apnea relief and relieve strain from knees but also will allow for greater motivation in increased activity, helping with her mood disorders and depression. F/u in one month.  Essential hypertension BP low normal on current regimen. Patient not sure why she is on atenolol. Patient states she was previously taking twice daily, recently only taking once daily due to low BP. Instructed to take once daily and follow up in one month for possible med adjustment.  Healthcare Maintenance Will obtain mammogram records. Will refer to GI for screening colonoscopy. Advised patient to schedule well woman visit for pap smear.  Orders Placed This Encounter  Procedures  . Basic Metabolic Panel  . Microalbumin/Creatinine Ratio, Urine  .  HgB A1c  . POCT UA - Microalbumin   Meds ordered this encounter  Medications  . atenolol (TENORMIN) 50 MG tablet    Sig: Take 1 tablet (50 mg total) by mouth daily.    Dispense:  90 tablet    Refill:  0    Rory Percy, DO PGY-1, [facility] 11/25/2017 3:22 PM

## 2017-11-25 ENCOUNTER — Other Ambulatory Visit: Payer: Self-pay

## 2017-11-25 ENCOUNTER — Ambulatory Visit (INDEPENDENT_AMBULATORY_CARE_PROVIDER_SITE_OTHER): Payer: Self-pay | Admitting: Family Medicine

## 2017-11-25 ENCOUNTER — Encounter: Payer: Self-pay | Admitting: Family Medicine

## 2017-11-25 DIAGNOSIS — I10 Essential (primary) hypertension: Secondary | ICD-10-CM | POA: Insufficient documentation

## 2017-11-25 DIAGNOSIS — E118 Type 2 diabetes mellitus with unspecified complications: Secondary | ICD-10-CM

## 2017-11-25 LAB — POCT GLYCOSYLATED HEMOGLOBIN (HGB A1C): HbA1c, POC (controlled diabetic range): 7 % (ref 0.0–7.0)

## 2017-11-25 LAB — POCT UA - MICROALBUMIN
CREATININE, POC: 300 mg/dL
Microalbumin Ur, POC: 80 mg/L

## 2017-11-25 MED ORDER — ATENOLOL 50 MG PO TABS: 50.0000 mg | ORAL_TABLET | Freq: Every day | ORAL | 0 refills | Status: DC

## 2017-11-25 NOTE — Assessment & Plan Note (Addendum)
A1c 7.0 today. No medication changes made today. Discussed diet, exercise and weight loss. Can consider nutritional referral at future visit. Discussed healthy plate and diabetic diet, handout provided. Foot exam completed today.

## 2017-11-25 NOTE — Assessment & Plan Note (Addendum)
BP low normal on current regimen. Patient not sure why she is on atenolol. Patient states she was previously taking twice daily, recently only taking once daily due to low BP. Instructed to take once daily and follow up in one month for possible med adjustment.

## 2017-11-25 NOTE — Patient Instructions (Addendum)
It was great to see you!  Our plan for today: - Obtain labs to check kidney function - Schedule colonoscopy and well woman visit - obtain records from recent mammogram - refill medications - make follow up appointment to discuss sleep apnea - see below for diet and exercise recommendations for diabetes  We are checking some labs today, we will call you or send you a letter if they are abnormal.   Take care and seek immediate care sooner if you develop any concerns.   Dr. [facility]   Diet Recommendations for Diabetes   1. Eat at least 3 meals and 1-2 snacks per day. Never go more than 4-5 hours while awake without eating. Eat breakfast within the first hour of getting up.   2. Limit starchy foods to TWO per meal and ONE per snack. ONE portion of a starchy  food is equal to the following:   - ONE slice of bread (or its equivalent, such as half of a hamburger bun).   - 1/2 cup of a "scoopable" starchy food such as potatoes or rice.   - 15 grams of Total Carbohydrate as shown on food label.  3. Include at every meal: a protein food, a carb food, and vegetables and/or fruit.   - Obtain twice the volume of vegetables as protein or carbohydrate foods for both lunch and dinner.   - Fresh or frozen vegetables are best.   - Keep frozen vegetables on hand for a quick vegetable serving.       Starchy (carb) foods: Bread, rice, pasta, potatoes, corn, cereal, grits, crackers, bagels, muffins, all baked goods.  (Fruits, milk, and yogurt also have carbohydrate, but most of these foods will not spike your blood sugar as most starchy foods will.)  A few fruits do cause high blood sugars; use small portions of bananas (limit to 1/2 at a time), grapes, watermelon, oranges, and most tropical fruits.    Protein foods: Meat, fish, poultry, eggs, dairy foods, and beans such as pinto and kidney beans (beans also provide carbohydrate).  Here is an example of what a healthy plate looks  like:    ? Make half your plate fruits and vegetables.     ? Focus on whole fruits.     ? Vary your veggies.  ? Make half your grains whole grains. -     ? Look for the word "whole" at the beginning of the ingredients list    ? Some whole-grain ingredients include whole oats, whole-wheat flour,        whole-grain corn, whole-grain brown rice, and whole rye.  ? Move to low-fat and fat-free milk or yogurt.  ? Vary your protein routine. - Meat, fish, poultry (chicken, [place]), eggs, beans (kidney, pinto), dairy.  ? Drink and eat less sodium, saturated fat, and added sugars.   Look for opportunities to move your body throughout your day:  Never lie down when you can sit; never sit when you can stand; never stand when you can pace.  Moving your body throughout the day is just as important as the 30 or 60 minutes of exercise at the gym!  Get social Get active with your friends instead of going out to eat. Go for a hike, walk around the mall, or play an exercise-themed video game.   Move more at work Fit more activity into the workday. Stand during phone calls, use a printer farther from your desk, and get up to stretch  each hour.    Do something new Develop a new skill to kick-start your motivation. Sign up for a class to learn how to [employer], surf, do tai chi, or play a sport.    Keep cool in the pool Don't like to sweat? Hit the local community pool for a swim, water polo, or water aerobics class to stay cool while exercising.    Stay on track Use a fitness tracker (FITBIT, Fitness Pal mobile app) to track your activity and provide motivation to reach your goals.

## 2017-11-25 NOTE — Assessment & Plan Note (Addendum)
Discussed need for diet, exercise, weight loss to promote better health. Believe this will not only help to control BG, aid in sleep apnea relief and relieve strain from knees but also will allow for greater motivation in increased activity, helping with her mood disorders and depression. F/u in one month.

## 2017-11-26 ENCOUNTER — Encounter: Payer: Self-pay | Admitting: *Deleted

## 2017-11-26 LAB — BASIC METABOLIC PANEL
BUN / CREAT RATIO: 19 (ref 9–23)
BUN: 13 mg/dL (ref 6–24)
CHLORIDE: 98 mmol/L (ref 96–106)
CO2: 23 mmol/L (ref 20–29)
Calcium: 9.7 mg/dL (ref 8.7–10.2)
Creatinine, Ser: 0.68 mg/dL (ref 0.57–1.00)
GFR calc non Af Amer: 102 mL/min/{1.73_m2} (ref >59)
GFR, EST AFRICAN AMERICAN: 118 mL/min/{1.73_m2} (ref >59)
Glucose: 124 mg/dL — ABNORMAL HIGH (ref 65–99)
Potassium: 4.3 mmol/L (ref 3.5–5.2)
Sodium: 139 mmol/L (ref 134–144)

## 2017-12-02 ENCOUNTER — Encounter: Payer: Self-pay | Admitting: Gastroenterology

## 2017-12-12 ENCOUNTER — Ambulatory Visit: Payer: Self-pay | Admitting: Gastroenterology

## 2017-12-18 DIAGNOSIS — F39 Unspecified mood [affective] disorder: Secondary | ICD-10-CM | POA: Insufficient documentation

## 2017-12-18 NOTE — Progress Notes (Deleted)
  Subjective:   Patient ID: Traci Sermon    DOB: 13-Jul-1965, 52 y.o. female   MRN: [id number]  Elena Davia is a 52 y.o. female with a history of HTN, DM2, OA, obesity here for   Sleep apnea - daytime somnolence?*** - sleep study done 6/23 at [facility] with moderate OSA with desats to low of 87%. Recommending CPAP and weight loss.  Review of Systems:  Per HPI.   [place], medications and smoking status reviewed.  Objective:   There were no vitals taken for this visit. Vitals and nursing note reviewed.  General: well nourished, well developed, in no acute distress with non-toxic appearance HEENT: normocephalic, atraumatic, moist mucous membranes Neck: supple, non-tender without lymphadenopathy CV: regular rate and rhythm without murmurs, rubs, or gallops, no lower extremity edema Lungs: clear to auscultation bilaterally with normal work of breathing Abdomen: soft, non-tender, non-distended, no masses or organomegaly palpable, normoactive bowel sounds Skin: warm, dry, no rashes or lesions Extremities: warm and well perfused, normal tone MSK: ROM grossly intact, strength intact, gait normal Neuro: Alert and oriented, speech normal  Assessment & Plan:   No problem-specific Assessment & Plan notes found for this encounter.  No orders of the defined types were placed in this encounter.  No orders of the defined types were placed in this encounter.   Rory Percy, DO PGY-2, [facility] 12/18/2017 11:43 PM

## 2017-12-19 ENCOUNTER — Encounter: Payer: Self-pay | Admitting: Family Medicine

## 2017-12-19 ENCOUNTER — Ambulatory Visit (INDEPENDENT_AMBULATORY_CARE_PROVIDER_SITE_OTHER): Payer: Self-pay | Admitting: Family Medicine

## 2017-12-19 VITALS — BP 134/80 | Ht 68.0 in | Wt 326.0 lb

## 2017-12-19 DIAGNOSIS — M25562 Pain in left knee: Secondary | ICD-10-CM

## 2017-12-19 MED ORDER — METHYLPREDNISOLONE ACETATE 40 MG/ML IJ SUSP
40.0000 mg | Freq: Once | INTRAMUSCULAR | Status: AC
  Administered 2017-12-19: 40 mg via INTRA_ARTICULAR

## 2017-12-19 NOTE — Progress Notes (Signed)
[place] Attending Note: I have seen and examined this patient. I have discussed this patient with the resident and reviewed the assessment and plan as documented above. I agree with the resident's findings and plan. Patient well-known to me.  She is trying to get back in shape, twisted her knee while doing some pool exercises.  Ultrasound today showed some effusion in the suprapatellar pouch and she has some mild tenderness along the medial joint line.  I think she has mild meniscal tear or irritation.  We discussed options and she chose corticosteroid injection today.  Follow-up if not improving in 2 to 4 weeks.  We discussed red flags.  PROCEDURE: INJECTION: Patient was given informed consent, signed copy in the chart. Appropriate time out was taken. Area prepped and draped in usual sterile fashion. Ethyl chloride was  used for local anesthesia. A 21 gauge 1 1/2 inch needle was used.. 1 cc of methylprednisolone 40 mg/ml plus  4 cc of 1% lidocaine without epinephrine was injected into the left knee using a(n) anterior medial approach.   The patient tolerated the procedure well. There were no complications. Post procedure instructions were given.

## 2017-12-19 NOTE — Progress Notes (Signed)
[name] is a 52 y.o. female who is here for left knee pain.    HPI:  [name] is a 52 year old female with history of hypertension, diabetes, pes planus, and osteoarthritis of the knees who comes to sports medicine for left knee pain x3 days. Currently 5/10 pain. Reports that 3 days ago she returned to the pool after a month off to begin exercising again.  Did treading water, hip and knee flexion, and walking in the water.  Reports left knee started aching while she was exercising but denies any one activity that caused the pain.  Describes a sharp pain on the inside of her left knee accompanied by swelling and stiffness.  The next day her knee was "really sore" and has continued to be painful and swollem.  Pain increases with walking and with standing from a seated position.  She continues to use her cane as normal.  Has tried multiple ointments, Tiger balm, Voltaren, BenGay all without relief.  Did take a Flexeril which helped her sleep.  Has a history of bilateral knee pain especially after falling last year on the ice where she impacted both knees.  Believes that one time she was told that she had a meniscal injury of both knees.  Denies any current numbness or tingling in her legs.  No new weakness.  Review of systems:  As stated above   Interval past medical history, surgical history, family history, and social history obtained and reviewed.   Patient Active Problem List   Diagnosis Date Noted  . Mood disorder ([place]) 12/18/2017  . Essential hypertension 11/25/2017  . Diabetes ([place]) 11/24/2017  . Bilateral pes planus 10/24/2017  . Chronic pain of left ankle 10/24/2017  . Primary osteoarthritis of both knees 09/30/2017  . Severe obesity (BMI >= 40) (HCC) 09/30/2017    Physical Exam:  BP 134/80   Ht 5\' 8"  (1.727 m)   Wt (!) 326 lb (147.9 kg)   BMI 49.57 kg/m     Physical Exam Physical exam: Vital signs are reviewed and are documented in the chart Gen.: Alert,  oriented, appears stated age, in no apparent distress, obese HEENT: Moist oral mucosa Respiratory: Normal respirations, able to speak in full sentences Cardiac: Regular rate, distal pulses 2+ Integumentary: No rashes on visible skin:  Neurologic: Strength 5/5 with bilateral knee flexion and extension, as well as bilateral ankle dorsiflexion, plantar flexion, inversion, eversion, sensation intact in bilateral lower extremities  Psych: Normal affect, mood is described as good Musculoskeletal: Knees: Inspection of knee shows mild effusion of left medial knee. No apparent deformity, increased warmth, erythema, or ecchymosis. L medial knee is diffusely tender, though difficult to palpate the joint line. No patellar or patellar tendon tenderness. Full flexion and extension, though with discomfort in L knee with extension. No patellar hypermobility. Difficult exam, but no signs of ligamentous instability with negative Lachman, anterior drawer, valgus, and varus stress testing.  Positive McMurray's of medial side.     Assessment/Plan: [name] is a 52yr old female with obesity, hypertension, diabetes, pes planus, and osteoarthritis of the knees who comes to sports medicine for left knee pain x3 days after returning to her pool exercises. Signs and symptoms are most likely due to meniscal injury, especially given her age, poor conditioning, and weight. Small effusion on exam which is also contributing to her pain. Steroid injection given today for symptomatic treatment. No new imaging required.  1) Left knee pain -  -steroid injection today, reviewed usual  benefits -continue ibuprofen and topical voltaren PRN -apply ice to knee -encouraged her to return to physical activity, like the pool, in the next 2-3days  Follow up: In 2-3 weeks if pain persists   Thereasa Distance, MD, [facility] PGY3

## 2017-12-23 ENCOUNTER — Ambulatory Visit: Payer: Self-pay | Admitting: Family Medicine

## 2017-12-26 ENCOUNTER — Ambulatory Visit: Payer: Self-pay | Admitting: Physician Assistant

## 2017-12-31 ENCOUNTER — Ambulatory Visit (INDEPENDENT_AMBULATORY_CARE_PROVIDER_SITE_OTHER): Payer: Self-pay | Admitting: Physician Assistant

## 2017-12-31 ENCOUNTER — Encounter: Payer: Self-pay | Admitting: Physician Assistant

## 2017-12-31 VITALS — BP 120/82 | HR 68 | Ht 68.0 in | Wt 328.6 lb

## 2017-12-31 DIAGNOSIS — K649 Unspecified hemorrhoids: Secondary | ICD-10-CM

## 2017-12-31 DIAGNOSIS — Z1211 Encounter for screening for malignant neoplasm of colon: Secondary | ICD-10-CM

## 2017-12-31 MED ORDER — NA SULFATE-K SULFATE-MG SULF 17.5-3.13-1.6 GM/177ML PO SOLN: 1.0000 | ORAL | 0 refills | Status: DC

## 2017-12-31 NOTE — Progress Notes (Signed)
Chief Complaint: Consult for screening colonoscopy  HPI:    [name] is a 52 year old African-American female with a past medical history as listed below, who was referred to me by Rory Percy, DO for consultation for screening colonoscopy.      Today, explains that most recently she has lost around 40 pounds because she was started on Cymbalta and this has caused a decrease in her appetite.  She is happy about this and hopes that it keeps going.      Denies any true GI complaints other than some hemorrhoids.  Has had trouble with them in the past where she would need to take a "sitz bath every day", but currently they just sometimes "fall out and get swollen" if she has diarrhea or constipation.  This may be a couple times a week and is "much better than it was" per the patient.  Denies any bleeding.    Family history of pancreatic cancer in her father.  No colon cancer.     Denies fever, chills, anorexia, nausea, vomiting or abdominal pain or symptoms that awaken her from sleep.  Past Medical History:  Diagnosis Date  . Arthritis   . 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 Chronic pain   . Diabetes ([place])   . Hypertension   . Sleep apnea     Past Surgical History:  Procedure Laterality Date  . ABDOMINAL HYSTERECTOMY  1999  . FRACTURE SURGERY    . LUNG LOBECTOMY  2010   right lower lobe    Current Outpatient Medications  Medication Sig Dispense Refill  . atenolol (TENORMIN) 50 MG tablet Take 1 tablet (50 mg total) by mouth daily. (Patient taking differently: Take 50 mg by mouth 2 (two) times daily. ) 90 tablet 0  . atomoxetine (STRATTERA) 60 MG capsule Take by mouth.    . cyclobenzaprine (FLEXERIL) 10 MG tablet Take by mouth.    . divalproex (DEPAKOTE) 500 MG DR tablet Take 500 mg by mouth 2 (two) times daily.    . DULoxetine (CYMBALTA) 20 MG capsule Take 20 mg by mouth 2 (two) times daily.    Marland Kitchen escitalopram (LEXAPRO) 20 MG tablet Take 20 mg by mouth daily.    . hydrochlorothiazide  (HYDRODIURIL) 25 MG tablet Take 25 mg by mouth daily.      . hydrOXYzine (ATARAX/VISTARIL) 50 MG tablet Take 50 mg by mouth 3 (three) times daily as needed for anxiety.    . hydrOXYzine (VISTARIL) 50 MG capsule Take 50 mg by mouth 2 (two) times daily as needed for anxiety.    Marland Kitchen ibuprofen (ADVIL,MOTRIN) 400 MG tablet Take 400 mg by mouth every 6 (six) hours as needed for moderate pain.    Marland Kitchen ibuprofen (ADVIL,MOTRIN) 600 MG tablet Take 1 tablet (600 mg total) by mouth every 6 (six) hours as needed. 30 tablet 0  . latanoprost (XALATAN) 0.005 % ophthalmic solution 1 drop at bedtime.    Marland Kitchen lurasidone (LATUDA) 40 MG TABS tablet Take 40 mg by mouth every evening. Taking 1 tablet nightly with meals.    . metFORMIN (GLUCOPHAGE) 500 MG tablet Take 1 tablet by mouth every morning, Take 2 tablets by mouth every evening    . mirtazapine (REMERON) 30 MG tablet Take 30 mg by mouth daily.    . Skin Protectants, Misc. (HYDROCERIN PLUS) CREA Apply topically.     No current facility-administered medications for this visit.     Allergies as of 12/31/2017  . (No Known Allergies)  Family History  Problem Relation Age of Onset  . COPD Mother   . Alcohol abuse Father   . Pancreatic cancer Father   . Diabetes Maternal Grandmother   . Hyperlipidemia Maternal Grandmother   . Hypertension Maternal Grandmother     Social History   Socioeconomic History  . Marital status: Single    Spouse name: Not on file  . Number of children: 3  . Years of education: Not on file  . Highest education level: Not on file  Occupational History  . Not on file  Social Needs  . Financial resource strain: Not on file  . Food insecurity:    Worry: Not on file    Inability: Not on file  . Transportation needs:    Medical: Not on file    Non-medical: Not on file  Tobacco Use  . Smoking status: Former Smoker    Types: Cigarettes    Last attempt to quit: 07/25/2013    Years since quitting: 4.4  . Smokeless tobacco: Never  Used  Substance and Sexual Activity  . Alcohol use: Yes    Comment: socialy  . Drug use: No  . Sexual activity: Yes    Birth control/protection: Surgical  Lifestyle  . Physical activity:    Days per week: Not on file    Minutes per session: Not on file  . Stress: Not on file  Relationships  . Social connections:    Talks on phone: Not on file    Gets together: Not on file    Attends religious service: Not on file    Active member of club or organization: Not on file    Attends meetings of clubs or organizations: Not on file    Relationship status: Not on file  . Intimate partner violence:    Fear of current or ex partner: Not on file    Emotionally abused: Not on file    Physically abused: Not on file    Forced sexual activity: Not on file  Other Topics Concern  . Not on file  Social History Narrative  . Not on file    Review of Systems:    Constitutional: No weight loss, fever or chills Skin: No rash Cardiovascular: No chest pain  Respiratory: No SOB Gastrointestinal: See HPI and otherwise negative Genitourinary: No dysuria Neurological: No headache Musculoskeletal: No new muscle or joint pain Hematologic: No bleeding  Psychiatric: +anxiety and depression    Physical Exam:  Vital signs: BP 120/82   Pulse 68   Ht 5\' 8"  (1.727 m)   Wt (!) 328 lb 9.6 oz (149.1 kg)   BMI 49.96 kg/m   Constitutional:   Pleasant obese AA female appears to be in NAD, Well developed, Well nourished, alert and cooperative Head:  Normocephalic and atraumatic. Eyes:   PEERL, EOMI. No icterus. Conjunctiva pink. Ears:  Normal auditory acuity. Neck:  Supple Throat: Oral cavity and pharynx without inflammation, swelling or lesion.  Respiratory: Respirations even and unlabored. Lungs clear to auscultation bilaterally.   No wheezes, crackles, or rhonchi.  Cardiovascular: Normal S1, S2. No MRG. Regular rate and rhythm. No peripheral edema, cyanosis or pallor.  Gastrointestinal:  Soft,  nondistended, nontender. No rebound or guarding. Normal bowel sounds. No appreciable masses or hepatomegaly. Rectal:  Not performed.  Msk:  Symmetrical without gross deformities. Without edema, no deformity or joint abnormality.  Neurologic:  Alert and  oriented x4;  grossly normal neurologically.  Skin:   Dry and intact without significant lesions  or rashes. Psychiatric: Demonstrates good judgement and reason without abnormal affect or behaviors.  No recent labs or imaging.  Assessment: 1.  Screening for colorectal cancer: 52 years old and never had a screening colonoscopy 2.  Hemorrhoids: Prolapsing per the patient over the past few years, no bleeding  Plan: 1.  Patient was scheduled for a colonoscopy with Dr. Silverio Decamp in the [facility].  Patient requests a female doctor and would also like her hemorrhoids looked at for possible banding in the future.  Did discuss risk, benefits, limitations and alternatives and the patient agrees to proceed.  BMI is close to limit, but I believe patient will continue to lose weight prior to time of her colonoscopy. 2.  Patient to return to clinic per recommendations from Dr. Silverio Decamp after time of procedure.  Ellouise Newer, PA-C [facility] 12/31/2017, 9:26 AM  Cc: Rory Percy, DO

## 2017-12-31 NOTE — Patient Instructions (Signed)

## 2018-01-01 ENCOUNTER — Telehealth: Payer: Self-pay | Admitting: Gastroenterology

## 2018-01-01 MED ORDER — NA SULFATE-K SULFATE-MG SULF 17.5-3.13-1.6 GM/177ML PO SOLN: 1.0000 | ORAL | 0 refills | Status: AC

## 2018-01-01 NOTE — Progress Notes (Signed)
Reviewed and agree with documentation and assessment and plan. K. Veena Nandigam , MD   

## 2018-01-26 ENCOUNTER — Ambulatory Visit: Payer: Medicaid Other | Admitting: Family Medicine

## 2018-02-09 ENCOUNTER — Ambulatory Visit (AMBULATORY_SURGERY_CENTER): Payer: Self-pay | Admitting: Gastroenterology

## 2018-02-09 ENCOUNTER — Encounter: Payer: Self-pay | Admitting: Gastroenterology

## 2018-02-09 VITALS — BP 134/72 | HR 67 | Temp 96.0°F | Resp 17 | Ht 68.0 in | Wt 328.0 lb

## 2018-02-09 DIAGNOSIS — Z1211 Encounter for screening for malignant neoplasm of colon: Secondary | ICD-10-CM

## 2018-02-09 DIAGNOSIS — K6289 Other specified diseases of anus and rectum: Secondary | ICD-10-CM

## 2018-02-09 DIAGNOSIS — D128 Benign neoplasm of rectum: Secondary | ICD-10-CM

## 2018-02-09 DIAGNOSIS — D129 Benign neoplasm of anus and anal canal: Secondary | ICD-10-CM

## 2018-02-09 DIAGNOSIS — K621 Rectal polyp: Secondary | ICD-10-CM

## 2018-02-09 MED ORDER — SODIUM CHLORIDE 0.9 % IV SOLN: 500.0000 mL | Freq: Once | INTRAVENOUS | Status: DC

## 2018-02-09 NOTE — Op Note (Signed)
[place] Patient Name: [name] Procedure Date: 02/09/2018 3:34 PM MRN: [id number] Endoscopist: Mauri Pole , MD Age: 52 Referring MD:  Date of Birth: 10/04/1965 Gender: Female Account #: [id number] Procedure:                Colonoscopy Indications:              Screening for colorectal malignant neoplasm Medicines:                Monitored Anesthesia Care Procedure:                Pre-Anesthesia Assessment:                           - Prior to the procedure, a History and Physical                            was performed, and patient medications and                            allergies were reviewed. The patient's tolerance of                            previous anesthesia was also reviewed. The risks                            and benefits of the procedure and the sedation                            options and risks were discussed with the patient.                            All questions were answered, and informed consent                            was obtained. Prior Anticoagulants: The patient has                            taken no previous anticoagulant or antiplatelet                            agents. ASA Grade Assessment: III - A patient with                            severe systemic disease. After reviewing the risks                            and benefits, the patient was deemed in                            satisfactory condition to undergo the procedure.                           After obtaining informed consent, the colonoscope  was passed under direct vision. Throughout the                            procedure, the patient's blood pressure, pulse, and                            oxygen saturations were monitored continuously. The                            Colonoscope was introduced through the anus and                            advanced to the the cecum, identified by                            appendiceal orifice  and ileocecal valve. The                            colonoscopy was performed without difficulty. The                            patient tolerated the procedure well. The quality                            of the bowel preparation was good. The ileocecal                            valve, appendiceal orifice, and rectum were                            photographed. Scope In: 3:41:30 PM Scope Out: 4:04:26 PM Scope Withdrawal Time: 0 hours 16 minutes 1 second  Total Procedure Duration: 0 hours 22 minutes 56 seconds  Findings:                 The perianal and digital rectal examinations were                            normal.                           Multiple small and large-mouthed diverticula were                            found in the sigmoid colon, descending colon,                            transverse colon, ascending colon and cecum.                           A 5 mm polyp was found in the rectum. The polyp was                            sessile. The polyp was removed with a cold snare.  Resection and retrieval were complete.                           Anal papilla(e) were hypertrophied. Biopsies were                            taken with a cold forceps for histology. Complications:            No immediate complications. Estimated Blood Loss:     Estimated blood loss was minimal. Impression:               - Moderate diverticulosis in the sigmoid colon, in                            the descending colon, in the transverse colon, in                            the ascending colon and in the cecum.                           - One 5 mm polyp in the rectum, removed with a cold                            snare. Resected and retrieved.                           - Anal papilla(e) were hypertrophied. Biopsied. Recommendation:           - Patient has a contact number available for                            emergencies. The signs and symptoms of potential                             delayed complications were discussed with the                            patient. Return to normal activities tomorrow.                            Written discharge instructions were provided to the                            patient.                           - Resume previous diet.                           - Continue present medications.                           - Await pathology results.                           - Repeat colonoscopy in 5-10 years for surveillance  based on pathology results. Mauri Pole, MD 02/09/2018 4:08:13 PM This report has been signed electronically.

## 2018-02-09 NOTE — Progress Notes (Signed)
Called to room to assist during endoscopic procedure.  Patient ID and intended procedure confirmed with present staff. Received instructions for my participation in the procedure from the performing physician.  

## 2018-02-09 NOTE — Progress Notes (Signed)
A/ox3 pleased with MAC, report to RN 

## 2018-02-09 NOTE — Patient Instructions (Signed)
YOU HAD AN ENDOSCOPIC PROCEDURE TODAY AT THE [facility]:   Refer to the procedure report that was given to you for any specific questions about what was found during the examination.  If the procedure report does not answer your questions, please call your gastroenterologist to clarify.  If you requested that your care partner not be given the details of your procedure findings, then the procedure report has been included in a sealed envelope for you to review at your convenience later.  YOU SHOULD EXPECT: Some feelings of bloating in the abdomen. Passage of more gas than usual.  Walking can help get rid of the air that was put into your GI tract during the procedure and reduce the bloating. If you had a lower endoscopy (such as a colonoscopy or flexible sigmoidoscopy) you may notice spotting of blood in your stool or on the toilet paper. If you underwent a bowel prep for your procedure, you may not have a normal bowel movement for a few days.  Please Note:  You might notice some irritation and congestion in your nose or some drainage.  This is from the oxygen used during your procedure.  There is no need for concern and it should clear up in a day or so.  SYMPTOMS TO REPORT IMMEDIATELY:   Following lower endoscopy (colonoscopy or flexible sigmoidoscopy):  Excessive amounts of blood in the stool  Significant tenderness or worsening of abdominal pains  Swelling of the abdomen that is new, acute  Fever of 100F or higher  For urgent or emergent issues, a gastroenterologist can be reached at any hour by calling [phone].   DIET:  We do recommend a small meal at first, but then you may proceed to your regular diet.  Drink plenty of fluids but you should avoid alcoholic beverages for 24 hours.  ACTIVITY:  You should plan to take it easy for the rest of today and you should NOT DRIVE or use heavy machinery until tomorrow (because of the sedation medicines used during the test).     FOLLOW UP: Our staff will call the number listed on your records the next business day following your procedure to check on you and address any questions or concerns that you may have regarding the information given to you following your procedure. If we do not reach you, we will leave a message.  However, if you are feeling well and you are not experiencing any problems, there is no need to return our call.  We will assume that you have returned to your regular daily activities without incident.  If any biopsies were taken you will be contacted by phone or by letter within the next 1-3 weeks.  Please call us at [phone] if you have not heard about the biopsies in 3 weeks.    SIGNATURES/CONFIDENTIALITY: You and/or your care partner have signed paperwork which will be entered into your electronic medical record.  These signatures attest to the fact that that the information above on your After Visit Summary has been reviewed and is understood.  Full responsibility of the confidentiality of this discharge information lies with you and/or your care-partner. 

## 2018-02-10 ENCOUNTER — Encounter: Payer: Self-pay | Admitting: Gastroenterology

## 2018-02-10 ENCOUNTER — Telehealth: Payer: Self-pay | Admitting: *Deleted

## 2018-02-10 NOTE — Telephone Encounter (Signed)
  Follow up Call-  Call back number 02/09/2018  Post procedure Call Back phone  # [phone]  Permission to leave phone message Yes  Some recent data might be hidden     Patient questions:  Do you have a fever, pain , or abdominal swelling? No. Pain Score  0 *  Have you tolerated food without any problems? Yes.    Have you been able to return to your normal activities? Yes.    Do you have any questions about your discharge instructions: Diet   No. Medications  No. Follow up visit  No.  Do you have questions or concerns about your Care? No.  Actions: * If pain score is 4 or above: No action needed, pain <4.

## 2018-02-19 ENCOUNTER — Encounter: Payer: Self-pay | Admitting: Gastroenterology

## 2018-03-11 ENCOUNTER — Encounter: Payer: Self-pay | Admitting: Family Medicine

## 2018-05-08 ENCOUNTER — Ambulatory Visit (INDEPENDENT_AMBULATORY_CARE_PROVIDER_SITE_OTHER): Payer: Self-pay | Admitting: Family Medicine

## 2018-12-16 ENCOUNTER — Other Ambulatory Visit (HOSPITAL_COMMUNITY)
Admission: RE | Admit: 2018-12-16 | Discharge: 2018-12-16 | Disposition: A | Discharge status: Home / Self Care | Payer: Medicaid Other | Source: Ambulatory Visit | Attending: Family Medicine | Admitting: Family Medicine

## 2018-12-16 ENCOUNTER — Other Ambulatory Visit: Payer: Self-pay

## 2018-12-16 ENCOUNTER — Ambulatory Visit: Payer: Medicaid Other | Admitting: Family Medicine

## 2018-12-16 ENCOUNTER — Ambulatory Visit (INDEPENDENT_AMBULATORY_CARE_PROVIDER_SITE_OTHER): Payer: Self-pay | Admitting: Family Medicine

## 2018-12-16 VITALS — BP 148/92 | HR 86

## 2018-12-16 DIAGNOSIS — N898 Other specified noninflammatory disorders of vagina: Secondary | ICD-10-CM | POA: Insufficient documentation

## 2018-12-16 DIAGNOSIS — B379 Candidiasis, unspecified: Secondary | ICD-10-CM

## 2018-12-16 DIAGNOSIS — A599 Trichomoniasis, unspecified: Secondary | ICD-10-CM

## 2018-12-16 LAB — POCT WET PREP (WET MOUNT): Clue Cells Wet Prep Whiff POC: NEGATIVE

## 2018-12-16 MED ORDER — METRONIDAZOLE 500 MG PO TABS: 500.0000 mg | ORAL_TABLET | Freq: Two times a day (BID) | ORAL | 0 refills | Status: DC

## 2018-12-16 MED ORDER — FLUCONAZOLE 150 MG PO TABS: 150.0000 mg | ORAL_TABLET | Freq: Once | ORAL | 0 refills | Status: AC

## 2018-12-16 NOTE — Patient Instructions (Signed)
It was great to meet you today! Thank you for letting me participate in your care!  Today, we discussed your symptoms of itching and discomfort and I will call you with your test results. We will discuss appropriate treatment once I have your test results.  Thank you for coming in and letting us take care of you today!  Be well, Harolyn Rutherford, DO PGY-2, [facility]

## 2018-12-16 NOTE — Progress Notes (Addendum)
     Subjective: Chief Complaint  Patient presents with  . Vaginal Itching     HPI: [name] is a 53 y.o. presenting to clinic today to discuss the following:  Vaginal Itching Patient states she began having vaginal itching a few weeks ago. She has no pain, no blood, no discharge, and has not noticed any foul odor. She endorses no new sexual partners and no recent unprotected sexual activity. She has no other symptoms. She denies fever, cough, abdominal pain, nausea, vomiting, any painful rash, no changes in appetite, and no changes in urinary frequency or bowel movements.  ROS noted in HPI.   Past Medical, Surgical, Social, and Family History Reviewed & Updated per EMR.   Pertinent Historical Findings include:   Social History   Tobacco Use  Smoking Status Former Smoker  . Types: Cigarettes  . Quit date: 07/25/2013  . Years since quitting: 5.3  Smokeless Tobacco Never Used    Objective: BP (!) 148/92   Pulse 86   SpO2 97%  Vitals and nursing notes reviewed  Physical Exam Gen: Alert and Oriented x 3, NAD CV: RRR, no murmurs, normal S1, S2 split Resp: CTAB, no wheezing, rales, or rhonchi, comfortable work of breathing Abd: non-distended, non-tender, soft, +bs in all four quadrants GU: No lesions on the labia majora or minor, whitish discharge in the vaginal vault, no noted cervical discharge, no strawberry appearing cervix, no blood. Skin: warm, dry, intact, no rashes  LABS CC/GC: Negative Wet Prep: Moderate Bacteria, Yeast moderate, few KOH, and Trichomonas present  Assessment/Plan:  Trichomonas infection Patient was positive for Trichomonas infection with symptoms of vaginal itching. - Metronidazole 500mg  BID for 7 days  Yeast infection Yeast found on wet prep along with whitish milky discharge on exam with symptoms of vaginal itching. - Diflucan 150mg  once   PATIENT EDUCATION PROVIDED: See AVS    Diagnosis and plan along with any newly prescribed  medication(s) were discussed in detail with this patient today. The patient verbalized understanding and agreed with the plan. Patient advised if symptoms worsen return to clinic or ER.    Orders Placed This Encounter  Procedures  . POCT Wet Prep [facility])    Meds ordered this encounter  Medications  . metroNIDAZOLE (FLAGYL) 500 MG tablet    Sig: Take 1 tablet (500 mg total) by mouth 2 (two) times daily.    Dispense:  21 tablet    Refill:  0  . fluconazole (DIFLUCAN) 150 MG tablet    Sig: Take 1 tablet (150 mg total) by mouth once for 1 dose.    Dispense:  1 tablet    Refill:  0     Harolyn Rutherford, DO 12/16/2018, 11:14 AM PGY-2 [place]

## 2018-12-17 LAB — CERVICOVAGINAL ANCILLARY ONLY
Chlamydia: NEGATIVE
Neisseria Gonorrhea: NEGATIVE

## 2018-12-21 DIAGNOSIS — B379 Candidiasis, unspecified: Secondary | ICD-10-CM | POA: Insufficient documentation

## 2018-12-21 DIAGNOSIS — A599 Trichomoniasis, unspecified: Secondary | ICD-10-CM | POA: Insufficient documentation

## 2018-12-21 NOTE — Assessment & Plan Note (Signed)
Patient was positive for Trichomonas infection with symptoms of vaginal itching. - Metronidazole 500mg  BID for 7 days

## 2018-12-21 NOTE — Assessment & Plan Note (Signed)
Yeast found on wet prep along with whitish milky discharge on exam with symptoms of vaginal itching. - Diflucan 150mg  once

## 2018-12-31 ENCOUNTER — Telehealth: Payer: Self-pay | Admitting: *Deleted

## 2018-12-31 ENCOUNTER — Ambulatory Visit: Payer: Self-pay | Admitting: Family Medicine

## 2018-12-31 ENCOUNTER — Other Ambulatory Visit: Payer: Self-pay

## 2018-12-31 ENCOUNTER — Telehealth (INDEPENDENT_AMBULATORY_CARE_PROVIDER_SITE_OTHER): Payer: Self-pay | Admitting: Family Medicine

## 2018-12-31 DIAGNOSIS — A599 Trichomoniasis, unspecified: Secondary | ICD-10-CM

## 2018-12-31 NOTE — Assessment & Plan Note (Signed)
Unclear why patient's partner was negative for trichomonas.  Can consider false negative read.  Advised that patient, her sexual partner, and all of the partners need to be treated as there is still concern for exposure and transmission.  Strict return precautions given.  Follow-up as needed.

## 2018-12-31 NOTE — Telephone Encounter (Signed)
LVm to call office back, tried to contact her to verify information prior to her phone visit with doctor. April Zimmerman Rumple, CMA

## 2018-12-31 NOTE — Progress Notes (Signed)
[facility] Telemedicine Visit I connected with  Traci Sermon on 12/31/18 by a video enabled telemedicine application and verified that I am speaking with the correct person using two identifiers.   I discussed the limitations of evaluation and management by telemedicine. The patient expressed understanding and agreed to proceed.  Patient consented to have virtual visit. Method of visit: Telephone  Encounter participants: Patient: [name] - located at home Provider: Caroline More - located at [facility] Others (if applicable): None  Chief Complaint: concern of trichomonas infection  HPI: Trichomonas infection Patient was diagnosed with trichomonas on July 1st and was told she also had a mild yeast infection. Patient has only had one sexual partner x1 year. Was in prison for 9 months. Partner was sexually active another partner while patient was in prison. Denies sex toy use. Partner went to his doctor and doctor treated him but test results were negative. Partner is married but has not had intercourse with wife in >5 years.   ROS: per HPI  Pertinent PMHx: Trichomonas infection, yeast infection  Exam:  Respiratory: no respiratory distress, speaking full sentences, NAD  Assessment/Plan:  Trichomonas infection Unclear why patient's partner was negative for trichomonas.  Can consider false negative read.  Advised that patient, her sexual partner, and all of the partners need to be treated as there is still concern for exposure and transmission.  Strict return precautions given.  Follow-up as needed.    Time spent during visit with patient: 10 minutes

## 2019-01-06 ENCOUNTER — Ambulatory Visit: Payer: Medicaid Other | Admitting: Family Medicine

## 2019-01-24 ENCOUNTER — Ambulatory Visit (HOSPITAL_COMMUNITY)
Admission: EM | Admit: 2019-01-24 | Discharge: 2019-01-24 | Disposition: A | Discharge status: Home / Self Care | Payer: Medicaid Other | Attending: Family Medicine | Admitting: Family Medicine

## 2019-01-24 ENCOUNTER — Encounter (HOSPITAL_COMMUNITY): Payer: Self-pay | Admitting: Family Medicine

## 2019-01-24 ENCOUNTER — Other Ambulatory Visit: Payer: Self-pay

## 2019-01-24 DIAGNOSIS — L739 Follicular disorder, unspecified: Secondary | ICD-10-CM

## 2019-01-24 MED ORDER — DOXYCYCLINE HYCLATE 100 MG PO TABS: 100.0000 mg | ORAL_TABLET | Freq: Two times a day (BID) | ORAL | 0 refills | Status: DC

## 2019-01-24 NOTE — ED Triage Notes (Signed)
Patient presents to Urgent Care with complaints of painful abscess on her vagina since about 3 days ago. Patient reports she is not sure if it is an abscess or an HSV flare up.

## 2019-01-24 NOTE — Discharge Instructions (Addendum)
Just apply warm soapy compresses to these areas and take the antibiotic.  The small hair follicle infections should resolve in a week.

## 2019-01-24 NOTE — ED Provider Notes (Signed)
[place]    CSN: [id number] Arrival date & time: 01/24/19  1454     History   Chief Complaint Chief Complaint  Patient presents with  . Abscess    HPI [name] is a 53 y.o. female.   53 yo established [facility] patient  This woman is presenting for evaluation of abscess.  Patient presents to Urgent Care with complaints of painful abscess on her vagina since about 3 days ago. Patient reports she is not sure if it is an abscess or an HSV flare up.      Past Medical History:  Diagnosis Date  . Arthritis   . 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 Chronic pain   . Diabetes ([place])   . Hypertension   . Sleep apnea     Patient Active Problem List   Diagnosis Date Noted  . Trichomonas infection 12/21/2018  . Yeast infection 12/21/2018  . Mood disorder ([place]) 12/18/2017  . Essential hypertension 11/25/2017  . Diabetes ([place]) 11/24/2017  . Bilateral pes planus 10/24/2017  . Ankle arthritis 10/24/2017  . Primary osteoarthritis of both knees 09/30/2017  . Severe obesity (BMI >= 40) ([place]) 09/30/2017    Past Surgical History:  Procedure Laterality Date  . ABDOMINAL HYSTERECTOMY  1999  . FRACTURE SURGERY    . LUNG LOBECTOMY  2010   right lower lobe    OB History   No obstetric history on file.      Home Medications    Prior to Admission medications   Medication Sig Start Date End Date Taking? Authorizing Provider  atenolol (TENORMIN) 50 MG tablet Take 1 tablet (50 mg total) by mouth daily. Patient taking differently: Take 50 mg by mouth 2 (two) times daily.  11/25/17  Yes Rory Percy, DO  hydrOXYzine (VISTARIL) 25 MG capsule Take 25 mg by mouth 3 (three) times daily as needed.   Yes [provider]  atomoxetine (STRATTERA) 60 MG capsule Take by mouth.    [provider]  divalproex (DEPAKOTE) 500 MG DR tablet Take 500 mg by mouth 2 (two) times daily.    [provider]  doxycycline (VIBRA-TABS) 100 MG tablet Take 1 tablet (100 mg total)  by mouth 2 (two) times daily. 01/24/19   Robyn Haber, MD  DULoxetine (CYMBALTA) 20 MG capsule Take 20 mg by mouth 2 (two) times daily.    [provider]  escitalopram (LEXAPRO) 20 MG tablet Take 20 mg by mouth daily.    [provider]  hydrochlorothiazide (HYDRODIURIL) 25 MG tablet Take 25 mg by mouth daily.      [provider]  hydrOXYzine (ATARAX/VISTARIL) 50 MG tablet Take 50 mg by mouth 3 (three) times daily as needed for anxiety.    [provider]  hydrOXYzine (VISTARIL) 50 MG capsule Take 50 mg by mouth 2 (two) times daily as needed for anxiety.    [provider]  ibuprofen (ADVIL,MOTRIN) 400 MG tablet Take 400 mg by mouth every 6 (six) hours as needed for moderate pain.    [provider]  ibuprofen (ADVIL,MOTRIN) 600 MG tablet Take 1 tablet (600 mg total) by mouth every 6 (six) hours as needed. 09/22/13   Varney Biles, MD  latanoprost (XALATAN) 0.005 % ophthalmic solution 1 drop at bedtime.    [provider]  lurasidone (LATUDA) 40 MG TABS tablet Take 40 mg by mouth every evening. Taking 1 tablet nightly with meals.    [provider]  metFORMIN (GLUCOPHAGE) 500 MG tablet Take  1 tablet by mouth every morning, Take 2 tablets by mouth every evening 11/01/17   [provider]  metroNIDAZOLE (FLAGYL) 500 MG tablet Take 1 tablet (500 mg total) by mouth 2 (two) times daily. 12/16/18   Nuala Alpha, DO  mirtazapine (REMERON) 30 MG tablet Take 30 mg by mouth daily.    [provider]  Skin Protectants, Misc. (HYDROCERIN PLUS) CREA Apply topically.    [provider]    Family History Family History  Problem Relation Age of Onset  . COPD Mother   . Alcohol abuse Father   . Pancreatic cancer Father   . Diabetes Maternal Grandmother   . Hyperlipidemia Maternal Grandmother   . Hypertension Maternal Grandmother     Social History Social History   Tobacco Use  . Smoking status:  Former Smoker    Types: Cigarettes    Quit date: 07/25/2013    Years since quitting: 5.5  . Smokeless tobacco: Never Used  Substance Use Topics  . Alcohol use: Yes    Comment: socialy  . Drug use: No     Allergies   Patient has no known allergies.   Review of Systems Review of Systems   Physical Exam Triage Vital Signs ED Triage Vitals  Enc Vitals Group     BP      Pulse      Resp      Temp      Temp src      SpO2      Weight      Height      Head Circumference      Peak Flow      Pain Score      Pain Loc      Pain Edu?      Excl. in [place]?    No data found.  Updated Vital Signs BP (!) 153/86 (BP Location: Right Wrist)   Pulse 87   Temp 98.6 F (37 C) (Oral)   Resp 16   SpO2 100%    Physical Exam Vitals signs and nursing note reviewed.  Constitutional:      Appearance: Normal appearance.  Eyes:     Conjunctiva/sclera: Conjunctivae normal.  Neck:     Musculoskeletal: Normal range of motion and neck supple.  Abdominal:     Tenderness: There is no abdominal tenderness.  Musculoskeletal: Normal range of motion.  Skin:    Comments: Two small 3-4 mm papules on left upper labia.  Neurological:     General: No focal deficit present.     Mental Status: She is alert.      UC Treatments / Results  Labs (all labs ordered are listed, but only abnormal results are displayed) Labs Reviewed - No data to display  EKG   Radiology No results found.  Procedures Procedures (including critical care time)  Medications Ordered in UC Medications - No data to display  Initial Impression / Assessment and Plan / UC Course  I have reviewed the triage vital signs and the nursing notes.  Pertinent labs & imaging results that were available during my care of the patient were reviewed by me and considered in my medical decision making (see chart for details).     Final Clinical Impressions(s) / UC Diagnoses   Final diagnoses:  Folliculitis     Discharge  Instructions     Just apply warm soapy compresses to these areas and take the antibiotic.  The small hair follicle infections should resolve in a  week.    ED Prescriptions    Medication Sig Dispense Auth. Provider   doxycycline (VIBRA-TABS) 100 MG tablet Take 1 tablet (100 mg total) by mouth 2 (two) times daily. 20 tablet Robyn Haber, MD     Controlled Substance Prescriptions [place] Controlled Substance Registry consulted? Not Applicable   Robyn Haber, MD 01/24/19 [phone]

## 2019-02-03 ENCOUNTER — Other Ambulatory Visit: Payer: Self-pay

## 2019-02-03 ENCOUNTER — Ambulatory Visit (INDEPENDENT_AMBULATORY_CARE_PROVIDER_SITE_OTHER): Payer: Self-pay | Admitting: Family Medicine

## 2019-02-03 ENCOUNTER — Ambulatory Visit: Payer: Self-pay

## 2019-02-03 VITALS — BP 157/81 | Ht 68.0 in | Wt 326.0 lb

## 2019-02-03 DIAGNOSIS — M25572 Pain in left ankle and joints of left foot: Secondary | ICD-10-CM

## 2019-02-03 DIAGNOSIS — M25511 Pain in right shoulder: Secondary | ICD-10-CM

## 2019-02-03 MED ORDER — METHYLPREDNISOLONE ACETATE 40 MG/ML IJ SUSP
40.0000 mg | Freq: Once | INTRAMUSCULAR | Status: AC
  Administered 2019-02-03: 40 mg via INTRA_ARTICULAR
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
## 2019-02-03 NOTE — Patient Instructions (Addendum)
Your ankle pain is due to arthritis. These are the different medications you can take for this: Tylenol 500mg  1-2 tabs three times a day for pain. Capsaicin, aspercreme, or biofreeze topically up to four times a day may also help with pain. Some supplements that may help for arthritis: Boswellia extract, curcumin, pycnogenol Aleve 1-2 tabs twice a day with food for pain and inflammation - we can call you in something different or a prescription aleve if you would like Cortisone injections are an option - you were given this today. It's important that you continue to stay active. Laceup ankle brace for support. Consider physical therapy to strengthen muscles around the joint that hurts to take pressure off of the joint itself. Shoe inserts with good arch support may be helpful. Heat or ice 15 minutes at a time 3-4 times a day as needed to help with pain. Do home exercises with theraband 3 sets of 10 once a day. Follow up with me in 1 month.  You have strained your right rotator cuff. Try to avoid painful activities (overhead activities, lifting with extended arm) as much as possible. Aleve as noted above. Can take tylenol in addition to this. Consider physical therapy with transition to home exercise program. Do home exercise program with theraband and scapular stabilization exercises daily 3 sets of 10 once a day. If not improving at follow-up we will consider further imaging, physical therapy, and/or nitro patches. Follow up with me in 1 month.

## 2019-02-08 ENCOUNTER — Encounter: Payer: Self-pay | Admitting: Family Medicine

## 2019-02-23 ENCOUNTER — Other Ambulatory Visit: Payer: Self-pay

## 2019-02-23 ENCOUNTER — Ambulatory Visit (INDEPENDENT_AMBULATORY_CARE_PROVIDER_SITE_OTHER): Payer: Self-pay | Admitting: Family Medicine

## 2019-02-23 ENCOUNTER — Encounter: Payer: Self-pay | Admitting: Family Medicine

## 2019-02-23 VITALS — BP 134/68 | HR 75 | Ht 68.0 in | Wt 320.0 lb

## 2019-02-23 DIAGNOSIS — Z114 Encounter for screening for human immunodeficiency virus [HIV]: Secondary | ICD-10-CM

## 2019-02-23 DIAGNOSIS — E1169 Type 2 diabetes mellitus with other specified complication: Secondary | ICD-10-CM

## 2019-02-23 DIAGNOSIS — Z1231 Encounter for screening mammogram for malignant neoplasm of breast: Secondary | ICD-10-CM

## 2019-02-23 LAB — POCT GLYCOSYLATED HEMOGLOBIN (HGB A1C): HbA1c, POC (controlled diabetic range): 8.5 % — AB (ref 0.0–7.0)

## 2019-02-23 MED ORDER — METFORMIN HCL ER (MOD) 1000 MG PO TB24: 2000.0000 mg | ORAL_TABLET | Freq: Every day | ORAL | 3 refills | Status: DC

## 2019-02-23 NOTE — Patient Instructions (Signed)
It was great to see you!  Our plans for today:  - We changed your metformin to an extended release form.  This should help with your GI upset. -Make an appointment with our pharmacist, Dr. Valentina Lucks, to see about getting set up with a patient assistance program for diabetes medication. -We referred you to surgery for weight loss surgery. -It is recommended to get a pneumonia shot given you are at increased risk for getting pneumonia given you have diabetes. -Come back in 3 months.  We are checking some labs today, we will call you or send you a letter if they are abnormal.   Take care and seek immediate care sooner if you develop any concerns.   Dr. [facility]

## 2019-02-23 NOTE — Assessment & Plan Note (Signed)
Contributing to worsened diabetes as well as hypertension.  Congratulated patient on weight loss efforts.  Referred to surgery for bariatric surgery options per patient preference.

## 2019-02-23 NOTE — Progress Notes (Signed)
Subjective:   Patient ID: [name]    DOB: [birth year], 53 y.o. female   MRN: [id number]  [name] is a 53 y.o. female with a history of HTN, diabetes, ankle arthritis, mood disorder, bilateral knee OA, severe obesity here for   Diabetes, Type 2 - Last A1c 7.0 11/2017 - Medications: metformin 1000mg  BID - Compliance: good - Checking BG at home: no - Diet: See below - Exercise: See below - Eye exam: due - Foot exam: due - Microalbumin: due - Statin: no -PNA vaccine: Declines - Denies symptoms of hypoglycemia, polyuria, polydipsia, numbness extremities, foot ulcers/trauma - is on [facility]  Obesity - interested in weight loss surgery - has been trying to lose weight. Had friend and cousin who had bypass surgery and did well.  - has been walking more. hasn't been able to get back into [employer] yet. -Has been changing her sleeping habits and diet - years ago saw nutrition  Review of Systems:  Per HPI.  [place], medications and smoking status reviewed.  Objective:   BP 134/68   Pulse 75   Ht 5\' 8"  (1.727 m)   Wt (!) 320 lb (145.2 kg)   SpO2 95%   BMI 48.66 kg/m  Vitals and nursing note reviewed.  General: Morbidly obese, in no acute distress with non-toxic appearance CV: regular rate and rhythm without murmurs, rubs, or gallops Lungs: clear to auscultation bilaterally with normal work of breathing Skin: warm, dry, no rashes or lesions Extremities: warm and well perfused, normal tone.  Foot exam performed, no abnormalities identified. MSK: ROM grossly intact, strength intact, gait normal Neuro: Alert and oriented, speech normal  Assessment & Plan:   Diabetes ([place]) Worsened from a year ago, A1c 8.5 today.  Changed metformin to XR formulation given GI distress on twice daily formulation.  Would like to add SGLT2 but given patient does not have insurance, will refer to pharmacy for getting set up with a patient assistance program to help afford.  Commended patient  on weight loss efforts.  Referral provided for eye exam.  Foot exam done with no abnormalities.  Will obtain lipid panel today.  Unable to provide urine sample for urine micro.  Follow-up in 3 months for repeat A1c  Severe obesity (BMI >= 40) ([facility]) Contributing to worsened diabetes as well as hypertension.  Congratulated patient on weight loss efforts.  Referred to surgery for bariatric surgery options per patient preference.  Healthcare maintenance Obtaining lipid panel and HIV today.  Patient declined pneumococcal, Tdap, flu vaccines.  Up-to-date on mammogram, results in care everywhere.  Will need to make appointment to update Pap smear soon.  Orders Placed This Encounter  Procedures  . Lipid Panel  . HIV antibody (with reflex)  . Ambulatory referral to Ophthalmology    Referral Priority:   Routine    Referral Type:   Consultation    Referral Reason:   Specialty Services Required    Requested Specialty:   Ophthalmology    Number of Visits Requested:   1  . Ambulatory referral to General Surgery    Referral Priority:   Routine    Referral Type:   Surgical    Referral Reason:   Specialty Services Required    Requested Specialty:   General Surgery    Number of Visits Requested:   1  . HgB A1c   Meds ordered this encounter  Medications  . metFORMIN (GLUMETZA) 1000 MG (MOD) 24 hr tablet    Sig: Take  2 tablets (2,000 mg total) by mouth daily with breakfast.    Dispense:  90 tablet    Refill:  [place], DO PGY-3, [facility] 02/23/2019 12:52 PM

## 2019-02-23 NOTE — Assessment & Plan Note (Signed)
Worsened from a year ago, A1c 8.5 today.  Changed metformin to XR formulation given GI distress on twice daily formulation.  Would like to add SGLT2 but given patient does not have insurance, will refer to pharmacy for getting set up with a patient assistance program to help afford.  Commended patient on weight loss efforts.  Referral provided for eye exam.  Foot exam done with no abnormalities.  Will obtain lipid panel today.  Unable to provide urine sample for urine micro.  Follow-up in 3 months for repeat A1c

## 2019-02-24 ENCOUNTER — Other Ambulatory Visit: Payer: Self-pay | Admitting: Family Medicine

## 2019-02-24 LAB — LIPID PANEL
Chol/HDL Ratio: 3.2 ratio (ref 0.0–4.4)
Cholesterol, Total: 175 mg/dL (ref 100–199)
HDL: 54 mg/dL (ref >39)
LDL Chol Calc (NIH): 93 mg/dL (ref 0–99)
Triglycerides: 163 mg/dL — ABNORMAL HIGH (ref 0–149)
VLDL Cholesterol Cal: 28 mg/dL (ref 5–40)

## 2019-02-24 LAB — HIV ANTIBODY (ROUTINE TESTING W REFLEX): HIV Screen 4th Generation wRfx: NONREACTIVE

## 2019-02-24 MED ORDER — METFORMIN HCL ER 500 MG PO TB24: 2000.0000 mg | ORAL_TABLET | Freq: Every day | ORAL | 3 refills | Status: DC

## 2019-03-05 ENCOUNTER — Other Ambulatory Visit: Payer: Self-pay | Admitting: Student in an Organized Health Care Education/Training Program

## 2019-03-05 ENCOUNTER — Ambulatory Visit (INDEPENDENT_AMBULATORY_CARE_PROVIDER_SITE_OTHER): Payer: Medicaid Other | Admitting: Student in an Organized Health Care Education/Training Program

## 2019-03-05 ENCOUNTER — Other Ambulatory Visit (HOSPITAL_COMMUNITY)
Admission: RE | Admit: 2019-03-05 | Discharge: 2019-03-05 | Disposition: A | Discharge status: Home / Self Care | Payer: Medicaid Other | Source: Ambulatory Visit | Attending: Family Medicine | Admitting: Family Medicine

## 2019-03-05 ENCOUNTER — Other Ambulatory Visit: Payer: Self-pay

## 2019-03-05 VITALS — BP 139/80 | HR 76 | Wt 321.6 lb

## 2019-03-05 DIAGNOSIS — A64 Unspecified sexually transmitted disease: Secondary | ICD-10-CM

## 2019-03-05 DIAGNOSIS — N898 Other specified noninflammatory disorders of vagina: Secondary | ICD-10-CM | POA: Insufficient documentation

## 2019-03-05 DIAGNOSIS — B009 Herpesviral infection, unspecified: Secondary | ICD-10-CM | POA: Diagnosis not present

## 2019-03-05 LAB — POCT WET PREP (WET MOUNT): Clue Cells Wet Prep Whiff POC: POSITIVE

## 2019-03-05 MED ORDER — METRONIDAZOLE 500 MG PO TABS: 500.0000 mg | ORAL_TABLET | Freq: Two times a day (BID) | ORAL | 0 refills | Status: DC
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
## 2019-03-05 NOTE — Patient Instructions (Signed)
It was a pleasure to see you today!  To summarize our discussion for this visit:  We are testing you for sexually transmitted infection today.  I will call you with the results and sent a prescription to your pharmacy if needed.   Call the clinic at [phone] if your symptoms worsen or you have any concerns.   Thank you for allowing me to take part in your care,  Dr. Doristine Mango

## 2019-03-05 NOTE — Assessment & Plan Note (Addendum)
Patient presenting with recurrent symptoms similar to last trichomonas infection post intercourse with infected partner. Pelvic exam suspicious for trichomonas infection which was confirmed with wet prep.  Wet prep also had large amount of leukocytes. -Will call patient to inform of results from wet prep today and send in metronidazole treatment. -Results from gonorrhea chlamydia will not be resulted likely until Monday so we will follow-up with patient then. -Counseled patient on reducing spread of infection by informing partner and encouraging partner to seek treatment as well as inform his other sexual partners. -Discussed with patient rechecking for HIV and syphilis today and patient declined

## 2019-03-05 NOTE — Assessment & Plan Note (Signed)
No current outbreak

## 2019-03-06 LAB — CERVICOVAGINAL ANCILLARY ONLY
Chlamydia: NEGATIVE
Neisseria Gonorrhea: NEGATIVE

## 2019-03-08 ENCOUNTER — Ambulatory Visit: Payer: Medicaid Other | Admitting: Family Medicine

## 2019-03-16 ENCOUNTER — Other Ambulatory Visit: Payer: Self-pay | Admitting: *Deleted

## 2019-03-16 MED ORDER — HYDROCHLOROTHIAZIDE 25 MG PO TABS: 25.0000 mg | ORAL_TABLET | Freq: Every day | ORAL | 0 refills | Status: DC

## 2019-04-22 ENCOUNTER — Emergency Department (HOSPITAL_COMMUNITY)
Admission: EM | Admit: 2019-04-22 | Discharge: 2019-04-22 | Disposition: A | Discharge status: Home / Self Care | Payer: Medicaid Other | Attending: Emergency Medicine | Admitting: Emergency Medicine

## 2019-04-22 ENCOUNTER — Other Ambulatory Visit: Payer: Self-pay

## 2019-04-22 ENCOUNTER — Encounter (HOSPITAL_COMMUNITY): Payer: Self-pay | Admitting: Emergency Medicine

## 2019-04-22 DIAGNOSIS — Z87891 Personal history of nicotine dependence: Secondary | ICD-10-CM | POA: Insufficient documentation

## 2019-04-22 DIAGNOSIS — I10 Essential (primary) hypertension: Secondary | ICD-10-CM | POA: Insufficient documentation

## 2019-04-22 DIAGNOSIS — S0101XA Laceration without foreign body of scalp, initial encounter: Secondary | ICD-10-CM

## 2019-04-22 DIAGNOSIS — Y939 Activity, unspecified: Secondary | ICD-10-CM | POA: Insufficient documentation

## 2019-04-22 DIAGNOSIS — Z7984 Long term (current) use of oral hypoglycemic drugs: Secondary | ICD-10-CM | POA: Insufficient documentation

## 2019-04-22 DIAGNOSIS — Z23 Encounter for immunization: Secondary | ICD-10-CM | POA: Insufficient documentation

## 2019-04-22 DIAGNOSIS — S0990XA Unspecified injury of head, initial encounter: Secondary | ICD-10-CM

## 2019-04-22 DIAGNOSIS — W208XXA Other cause of strike by thrown, projected or falling object, initial encounter: Secondary | ICD-10-CM | POA: Insufficient documentation

## 2019-04-22 DIAGNOSIS — E119 Type 2 diabetes mellitus without complications: Secondary | ICD-10-CM | POA: Insufficient documentation

## 2019-04-22 DIAGNOSIS — Z79899 Other long term (current) drug therapy: Secondary | ICD-10-CM | POA: Insufficient documentation

## 2019-04-22 DIAGNOSIS — Y929 Unspecified place or not applicable: Secondary | ICD-10-CM | POA: Insufficient documentation

## 2019-04-22 DIAGNOSIS — Y999 Unspecified external cause status: Secondary | ICD-10-CM | POA: Insufficient documentation

## 2019-04-22 MED ORDER — ACETAMINOPHEN 325 MG PO TABS
650.0000 mg | ORAL_TABLET | Freq: Once | ORAL | Status: AC
  Administered 2019-04-22: 650 mg via ORAL
  Filled 2019-04-22: qty 2

## 2019-04-22 MED ORDER — TETANUS-DIPHTH-ACELL PERTUSSIS 5-2.5-18.5 LF-MCG/0.5 IM SUSP
0.5000 mL | Freq: Once | INTRAMUSCULAR | Status: AC
  Administered 2019-04-22: 13:00:00 0.5 mL via INTRAMUSCULAR
  Filled 2019-04-22: qty 0.5
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
## 2019-04-22 NOTE — Discharge Instructions (Addendum)
You were seen in the emergency department today following a head injury.  We suspect that you have a concussion, otherwise known and as a mild traumatic brain injury.  We would like you to follow-up with the [facility] concussion clinic, contact information below:  Address: [address] [place], [place] [address] Phone: [phone]  Per [facility] Website:   What to Expect: Evaluations at the [facility] All patients at the [facility] are given an extensive three-part evaluation that includes: a computerized test to measure memory, visual processing speed, and reaction time a test that measures the systems that integrate movement, balance, and vision an in-depth review of a detailed symptoms checklist for signs of concussion The evaluation process is critical for the treatment and recovery of a concussed patient as no two concussions are alike. Thankfully, the diagnostic tools that trained professionals use can help to better manage head injuries.  Part of the technology the doctors and staff at [facility] use in their assessments is a computerized examination called ImPACT. This tool uses six tasks to measure memory, visual processing speed, and reaction time. By analyzing the results of the examination and comparing them to average responses or a baseline score for a patient, our staff can make an informed judgment about the patients cognitive functions. In addition to the ImPACT examination, patients are given a Vestibular Ocular Motor Screening test. This is a simple and painless test that focuses on the systems that integrate a patients movement, balance, and vision.  These tests are used in conjunction with a thorough review of a detailed symptoms checklist to complete the patients evaluation and develop a treatment plan.  You do not need a referral, and you can book an appointment online. Our Concussion Hotline is  staffed by trained professionals during our regular office hours: Monday - Thursday from 7:30 AM to 4:30 PM, and Fridays from 7:30 AM to 12:00 PM, and the number to call is [phone]. The Concussion Clinic team meets with patients at our [facility] office. Call today.   Further ED Instructions:   Please call and follow-up within the concussion clinic as well as your primary care provider within the next 3 to 5 days.  In the meantime we would like you to avoid strenuous/over exertional activities such as sports or running.  Please avoid excess screen time utilizing cell phones, computers, or the TV.  Please avoid activities that require significant amount of concentration.  Please try to rest as much as possible.  Please take Tylenol and/or Motrin per over-the-counter dosing instructions for any continued discomfort.    Your laceration was closed with 1 staple. Please keep this area clean and dry for the next 24 hours, after 24 hours you may get this area wet, but avoid soaking the area.  Your tetanus has been updated  You will need to have the staple removed and the wound rechecked in 7-10days. Please return to the emergency department, go to an urgent care, or see your primary care provider to have this performed. Return to the ER soon should you start to experience pus type drainage from the wound, redness around the wound, or fevers as this could indicate the area is infected, please return to the ER for any other worsening symptoms or concerns that you may have.     Return to the ER for new or worsening symptoms including but not limited to double/blurry vision, vomiting, , numbness, weakness, worsening headache, seizure  activity, loss of consciousness, or any other concerns that you may have.
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
## 2019-04-30 ENCOUNTER — Ambulatory Visit (HOSPITAL_COMMUNITY)
Admission: EM | Admit: 2019-04-30 | Discharge: 2019-04-30 | Disposition: A | Discharge status: Home / Self Care | Payer: Medicaid Other

## 2019-05-02 ENCOUNTER — Ambulatory Visit (HOSPITAL_COMMUNITY)
Admission: EM | Admit: 2019-05-02 | Discharge: 2019-05-02 | Disposition: A | Discharge status: Home / Self Care | Payer: Medicaid Other

## 2019-05-02 ENCOUNTER — Other Ambulatory Visit: Payer: Self-pay

## 2019-05-02 ENCOUNTER — Encounter (HOSPITAL_COMMUNITY): Payer: Self-pay

## 2019-05-02 DIAGNOSIS — Z4802 Encounter for removal of sutures: Secondary | ICD-10-CM

## 2019-05-02 NOTE — ED Triage Notes (Signed)
Pt is here to have her staple removed from the top of her head.

## 2019-05-25 ENCOUNTER — Other Ambulatory Visit: Payer: Self-pay

## 2019-05-25 ENCOUNTER — Encounter (HOSPITAL_COMMUNITY): Payer: Self-pay | Admitting: Family Medicine

## 2019-05-25 ENCOUNTER — Ambulatory Visit (HOSPITAL_COMMUNITY)
Admission: EM | Admit: 2019-05-25 | Discharge: 2019-05-25 | Disposition: A | Discharge status: Home / Self Care | Payer: Self-pay | Attending: Family Medicine | Admitting: Family Medicine

## 2019-05-25 DIAGNOSIS — N764 Abscess of vulva: Secondary | ICD-10-CM

## 2019-05-25 MED ORDER — DOXYCYCLINE HYCLATE 100 MG PO TABS: 100.0000 mg | ORAL_TABLET | Freq: Two times a day (BID) | ORAL | 0 refills | Status: DC

## 2019-05-25 MED ORDER — LIDOCAINE HCL 2 % IJ SOLN
INTRAMUSCULAR | Status: AC
  Filled 2019-05-25: qty 20

## 2019-05-25 NOTE — Discharge Instructions (Signed)
Gentle moist compresses to the infected area.

## 2019-05-25 NOTE — ED Triage Notes (Signed)
Pt states having a painful boil in the right labia x 4 days.

## 2019-05-25 NOTE — ED Provider Notes (Signed)
[facility]    CSN: [id number] Arrival date & time: 05/25/19  1013      History   Chief Complaint Chief Complaint  Patient presents with  . Abscess    HPI [name] is a 53 y.o. female.   53 yo established [facility] patient who has presented in past with infected skin lesions on labia.  She is on chronic pain meds.       Patient Active Problem List   Diagnosis Date Noted   Chronic pain syndrome 03/05/2019    03/05/2019    12/21/2018  . Mood disorder ([place]) 12/18/2017  . Essential hypertension 11/25/2017  . Diabetes ([place]) 11/24/2017  .    Marland Kitchen Ankle arthritis 10/24/2017  . Primary osteoarthritis of both knees 09/30/2017  . Severe obesity (BMI >= 40) ([place]) 09/30/2017    Past Surgical History:  Procedure Laterality Date  . ABDOMINAL HYSTERECTOMY  1999  . FRACTURE SURGERY    . LUNG LOBECTOMY  2010   right lower lobe    OB History   No obstetric history on file.      Home Medications    Prior to Admission medications   Medication Sig Start Date End Date Taking? Authorizing Provider  valACYclovir (VALTREX) 1000 MG tablet Take by mouth. 03/25/19  Yes [provider]  atenolol (TENORMIN) 50 MG tablet Take 1 tablet (50 mg total) by mouth daily. Patient taking differently: Take 50 mg by mouth 2 (two) times daily.  11/25/17   Rory Percy, DO  atomoxetine (STRATTERA) 60 MG capsule Take by mouth.    [provider]  divalproex (DEPAKOTE) 500 MG DR tablet Take 500 mg by mouth 2 (two) times daily.    [provider]  doxycycline (VIBRA-TABS) 100 MG tablet Take 1 tablet (100 mg total) by mouth 2 (two) times daily. 05/25/19   Robyn Haber, MD  DULoxetine (CYMBALTA) 20 MG capsule Take 20 mg by mouth 2 (two) times daily.    [provider]  escitalopram (LEXAPRO) 20 MG tablet Take 20 mg by mouth daily.    [provider]  hydrochlorothiazide (HYDRODIURIL) 25 MG tablet Take 1 tablet (25 mg total) by mouth daily.  03/16/19   Rory Percy, DO  hydrOXYzine (ATARAX/VISTARIL) 50 MG tablet Take 50 mg by mouth 3 (three) times daily as needed for anxiety.    [provider]  hydrOXYzine (VISTARIL) 25 MG capsule Take 25 mg by mouth 3 (three) times daily as needed.    [provider]  hydrOXYzine (VISTARIL) 50 MG capsule Take 50 mg by mouth 2 (two) times daily as needed for anxiety.    [provider]  ibuprofen (ADVIL,MOTRIN) 400 MG tablet Take 400 mg by mouth every 6 (six) hours as needed for moderate pain.    [provider]  ibuprofen (ADVIL,MOTRIN) 600 MG tablet Take 1 tablet (600 mg total) by mouth every 6 (six) hours as needed. 09/22/13   Varney Biles, MD  latanoprost (XALATAN) 0.005 % ophthalmic solution 1 drop at bedtime.    [provider]  lurasidone (LATUDA) 40 MG TABS tablet Take 40 mg by mouth every evening. Taking 1 tablet nightly with meals.    [provider]  metFORMIN (GLUCOPHAGE-XR) 500 MG 24 hr tablet Take 4 tablets (2,000 mg total) by mouth daily with breakfast. 02/24/19 05/25/19  Rory Percy, DO  metroNIDAZOLE (FLAGYL) 500 MG tablet Take 1 tablet (500 mg total) by mouth 2 (two) times daily. 03/05/19   Doristine Mango L, DO  mirtazapine (REMERON) 30 MG tablet Take 30 mg by mouth daily.    [provider]  Skin Protectants, Misc. (HYDROCERIN PLUS) CREA Apply topically.    [provider]    Family History Family History  Problem Relation Age of Onset  . COPD Mother   . Alcohol abuse Father   . Pancreatic cancer Father   . Diabetes Maternal Grandmother   . Hyperlipidemia Maternal Grandmother   . Hypertension Maternal Grandmother     Social History Social History   Tobacco Use  . Smoking status: Former Smoker    Types: Cigarettes    Quit date: 07/25/2013    Years since quitting: 5.8  . Smokeless tobacco: Never Used  Substance Use Topics  . Alcohol use: Yes    Comment: socialy  . Drug use: No      Allergies   Patient has no known allergies.   Review of Systems Review of Systems   Physical Exam Triage Vital Signs ED Triage Vitals  Enc Vitals Group     BP      Pulse      Resp      Temp      Temp src      SpO2      Weight      Height      Head Circumference      Peak Flow      Pain Score      Pain Loc      Pain Edu?      Excl. in [place]?    No data found.  Updated Vital Signs BP 112/70 (BP Location: Right Arm)   Pulse 84   Temp 98.1 F (36.7 C) (Oral)   Resp 16    Physical Exam Vitals signs and nursing note reviewed.  Constitutional:      Appearance: Normal appearance. She is obese.  Neck:     Musculoskeletal: Normal range of motion and neck supple.  Pulmonary:     Effort: Pulmonary effort is normal.  Musculoskeletal: Normal range of motion.  Skin:    General: Skin is warm and dry.     Findings: Erythema and lesion present.     Comments: Right labial 2 cm tender induration  Neurological:     General: No focal deficit present.     Mental Status: She is alert and oriented to person, place, and time.  Psychiatric:        Mood and Affect: Mood normal.      UC Treatments / Results  Labs (all labs ordered are listed, but only abnormal results are displayed) Labs Reviewed - No data to display  EKG   Radiology No results found.  Procedures Incision and Drainage  Date/Time: 05/25/2019 10:46 AM Performed by: Robyn Haber, MD Authorized by: Robyn Haber, MD   Consent:    Consent obtained:  Verbal   Consent given by:  Patient   Risks discussed:  Incomplete drainage and infection   Alternatives discussed:  No treatment Location:    Type:  Abscess   Location:  Anogenital   Anogenital location:  Perineum Pre-procedure details:    Skin preparation:  Betadine Anesthesia (see MAR for exact dosages):    Anesthesia method:  Local infiltration   Local anesthetic:  Lidocaine 1% w/o epi Procedure type:    Complexity:  Simple Procedure  details:    Needle aspiration: no     Incision types:  Stab incision   Incision depth:  Dermal   Scalpel  blade:  11   Wound management:  Probed and deloculated   Drainage:  Purulent   Drainage amount:  Scant   Wound treatment:  Wound left open   Packing materials:  None Post-procedure details:    Patient tolerance of procedure:  Tolerated well, no immediate complications   (including critical care time)  Medications Ordered in UC Medications  lidocaine (XYLOCAINE) 2 % (with pres) injection (has no administration in time range)    Initial Impression / Assessment and Plan / UC Course  I have reviewed the triage vital signs and the nursing notes.  Pertinent labs & imaging results that were available during my care of the patient were reviewed by me and considered in my medical decision making (see chart for details).    Final Clinical Impressions(s) / UC Diagnoses   Final diagnoses:  Abscess of genital labia     Discharge Instructions     Gentle moist compresses to the infected area.    ED Prescriptions    Medication Sig Dispense Auth. Provider   doxycycline (VIBRA-TABS) 100 MG tablet Take 1 tablet (100 mg total) by mouth 2 (two) times daily. 20 tablet Robyn Haber, MD     I have reviewed the PDMP during this encounter.   Robyn Haber, MD 05/25/19 1048

## 2019-09-20 DIAGNOSIS — M79641 Pain in right hand: Secondary | ICD-10-CM | POA: Insufficient documentation

## 2019-10-21 DIAGNOSIS — G4733 Obstructive sleep apnea (adult) (pediatric): Secondary | ICD-10-CM | POA: Insufficient documentation

## 2019-10-21 DIAGNOSIS — E7849 Other hyperlipidemia: Secondary | ICD-10-CM | POA: Insufficient documentation

## 2020-08-01 DIAGNOSIS — L219 Seborrheic dermatitis, unspecified: Secondary | ICD-10-CM | POA: Insufficient documentation

## 2020-10-30 HISTORY — PX: ROUX-EN-Y GASTRIC BYPASS: SHX1104

## 2020-11-06 DIAGNOSIS — Z9889 Other specified postprocedural states: Secondary | ICD-10-CM | POA: Insufficient documentation

## 2020-11-06 DIAGNOSIS — R112 Nausea with vomiting, unspecified: Secondary | ICD-10-CM | POA: Insufficient documentation

## 2020-12-05 DIAGNOSIS — Z9884 Bariatric surgery status: Secondary | ICD-10-CM | POA: Insufficient documentation

## 2020-12-12 DIAGNOSIS — B351 Tinea unguium: Secondary | ICD-10-CM | POA: Insufficient documentation

## 2020-12-27 DIAGNOSIS — M79672 Pain in left foot: Secondary | ICD-10-CM | POA: Insufficient documentation

## 2021-07-02 NOTE — Progress Notes (Deleted)
Note in error. Please see 1/18 note

## 2021-07-04 ENCOUNTER — Ambulatory Visit (INDEPENDENT_AMBULATORY_CARE_PROVIDER_SITE_OTHER): Payer: Self-pay | Admitting: Internal Medicine

## 2021-07-04 VITALS — BP 133/65 | HR 54 | Temp 98.2°F | Ht 68.0 in | Wt 273.7 lb

## 2021-07-04 DIAGNOSIS — I10 Essential (primary) hypertension: Secondary | ICD-10-CM

## 2021-07-04 DIAGNOSIS — E119 Type 2 diabetes mellitus without complications: Secondary | ICD-10-CM

## 2021-07-04 DIAGNOSIS — B351 Tinea unguium: Secondary | ICD-10-CM

## 2021-07-04 DIAGNOSIS — Z9884 Bariatric surgery status: Secondary | ICD-10-CM

## 2021-07-04 DIAGNOSIS — Z0000 Encounter for general adult medical examination without abnormal findings: Secondary | ICD-10-CM

## 2021-07-04 DIAGNOSIS — G4733 Obstructive sleep apnea (adult) (pediatric): Secondary | ICD-10-CM

## 2021-07-04 DIAGNOSIS — L219 Seborrheic dermatitis, unspecified: Secondary | ICD-10-CM

## 2021-07-04 DIAGNOSIS — F1191 Opioid use, unspecified, in remission: Secondary | ICD-10-CM

## 2021-07-04 MED ORDER — ATORVASTATIN CALCIUM 20 MG PO TABS: 20.0000 mg | ORAL_TABLET | Freq: Every day | ORAL | 3 refills | Status: DC

## 2021-07-04 MED ORDER — HYDROCHLOROTHIAZIDE 25 MG PO TABS: 25.0000 mg | ORAL_TABLET | Freq: Every day | ORAL | 0 refills | Status: DC

## 2021-07-04 MED ORDER — NAPROXEN 500 MG PO TABS: 500.0000 mg | ORAL_TABLET | Freq: Two times a day (BID) | ORAL | 2 refills | Status: DC

## 2021-07-04 MED ORDER — ATENOLOL 50 MG PO TABS: 50.0000 mg | ORAL_TABLET | Freq: Every day | ORAL | 0 refills | Status: DC

## 2021-07-04 MED ORDER — CYCLOBENZAPRINE HCL 5 MG PO TABS: 5.0000 mg | ORAL_TABLET | Freq: Every day | ORAL | 0 refills | Status: AC

## 2021-07-04 MED ORDER — OMEPRAZOLE 40 MG PO CPDR: 40.0000 mg | DELAYED_RELEASE_CAPSULE | Freq: Every day | ORAL | 3 refills | Status: DC

## 2021-07-04 NOTE — Progress Notes (Signed)
°  Subjective:     Patient ID: [name], female   DOB: 04-28-1966, 56 y.o.   MRN: [id number]  HPI [name] is a 56yo woman with history of roux-en-y gastric bypass, obstructive sleep apnea, onychomycosis, and opioid use disorder who presents for follow up today.  Her insurance changed, so she needed to find a new PCP and new specialists.  Her main concern today is 2 weeks of worsening right shoulder pain. Pain is worse with lifting her arm over her head, like when brushing her hair. No preceding trauma. Has tried topicals OTC without much relief. History of traumatic R shoulder injury s/p surgery years ago. No hand weakness and no pain shooting down to hand.   Her other concern is making sure she can see the specialists she needs with her new insurance.   She was previously seeing Dr. Evorn Gong for post-bariatric surgical care, last week 10/30/20.   Also seeing Derm, Podiatry, and Neuro previously, and would like to re-establish with these providers.   Review of Systems  Constitutional:  Negative for chills and fever.  HENT:  Negative for congestion and sore throat.   Respiratory:  Negative for cough and shortness of breath.   Cardiovascular:  Negative for chest pain and leg swelling.  Gastrointestinal:  Negative for abdominal pain, nausea and vomiting.  Musculoskeletal:  Positive for arthralgias.  Neurological:  Negative for syncope and light-headedness.  Psychiatric/Behavioral:  Negative for dysphoric mood. The patient is not nervous/anxious.       Objective:   Physical Exam Constitutional:      General: She is not in acute distress.    Appearance: Normal appearance. She is not toxic-appearing.  HENT:     Head: Normocephalic and atraumatic.  Eyes:     Extraocular Movements: Extraocular movements intact.     Conjunctiva/sclera: Conjunctivae normal.  Cardiovascular:     Rate and Rhythm: Normal rate and regular rhythm.     Heart sounds: No murmur heard. Pulmonary:     Effort:  Pulmonary effort is normal. No respiratory distress.     Breath sounds: Normal breath sounds.  Abdominal:     General: There is no distension.     Tenderness: There is no abdominal tenderness.  Musculoskeletal:        General: No deformity.     Right lower leg: No edema.     Left lower leg: No edema.     Comments: +pain with raising R arm >180 degrees, +empty can test on R  Skin:    General: Skin is warm and dry.     Coloration: Skin is not jaundiced.  Neurological:     General: No focal deficit present.     Mental Status: She is alert. Mental status is at baseline.  Psychiatric:        Mood and Affect: Mood normal.        Thought Content: Thought content normal.       Assessment:     Refer to Encounters tab for Problem-Based Charting     Plan:     Refer to Encounters tab for Problem-Based Charting

## 2021-07-04 NOTE — Patient Instructions (Signed)
-   It was a pleasure seeing you today  - For your shoulder, I'll send you a prescription for Naproxen to take twice daily. Take it with food. You can also use your Flexiril. Do the exercises I showed you with a milk carton.  - I have put in referrals to your new specialists.   - I will see you back in 6 weeks

## 2021-07-05 LAB — CMP14 + ANION GAP
ALT: 24 IU/L (ref 0–32)
AST: 26 IU/L (ref 0–40)
Albumin/Globulin Ratio: 1.5 (ref 1.2–2.2)
Albumin: 4.1 g/dL (ref 3.8–4.9)
Alkaline Phosphatase: 86 IU/L (ref 44–121)
Anion Gap: 17 mmol/L (ref 10.0–18.0)
BUN/Creatinine Ratio: 18 (ref 9–23)
BUN: 12 mg/dL (ref 6–24)
Bilirubin Total: 0.4 mg/dL (ref 0.0–1.2)
CO2: 22 mmol/L (ref 20–29)
Calcium: 9.6 mg/dL (ref 8.7–10.2)
Chloride: 103 mmol/L (ref 96–106)
Creatinine, Ser: 0.65 mg/dL (ref 0.57–1.00)
Globulin, Total: 2.8 g/dL (ref 1.5–4.5)
Glucose: 133 mg/dL — ABNORMAL HIGH (ref 70–99)
Potassium: 4.5 mmol/L (ref 3.5–5.2)
Sodium: 142 mmol/L (ref 134–144)
Total Protein: 6.9 g/dL (ref 6.0–8.5)
eGFR: 105 mL/min/{1.73_m2} (ref >59)

## 2021-07-05 LAB — LIPID PANEL
Chol/HDL Ratio: 2.4 ratio (ref 0.0–4.4)
Cholesterol, Total: 149 mg/dL (ref 100–199)
HDL: 62 mg/dL (ref >39)
LDL Chol Calc (NIH): 67 mg/dL (ref 0–99)
Triglycerides: 114 mg/dL (ref 0–149)
VLDL Cholesterol Cal: 20 mg/dL (ref 5–40)

## 2021-07-05 LAB — MICROALBUMIN / CREATININE URINE RATIO
Creatinine, Urine: 86 mg/dL
Microalb/Creat Ratio: <3 mg/g creat (ref 0–29)
Microalbumin, Urine: <3 ug/mL

## 2021-07-05 LAB — HEMOGLOBIN A1C
Est. average glucose Bld gHb Est-mCnc: 128 mg/dL
Hgb A1c MFr Bld: 6.1 % — ABNORMAL HIGH (ref 4.8–5.6)

## 2021-07-05 LAB — HCV INTERPRETATION

## 2021-07-05 LAB — HCV AB W REFLEX TO QUANT PCR: HCV Ab: <0.1 s/co ratio (ref 0.0–0.9)

## 2021-07-05 NOTE — Progress Notes (Signed)
Labs reviewed. BMP normal. Cholesterol panel looks good on atorvastatin 20.  HCV negative.  A1C 6.1, down from 7.0 before. This is at goal, so we don't need any meds now. Could consider Ozempic in the future for weight loss & diabetes if A1C rises, but not needed today.  No change to current mgmt.

## 2021-07-10 ENCOUNTER — Ambulatory Visit (INDEPENDENT_AMBULATORY_CARE_PROVIDER_SITE_OTHER): Payer: 59 | Admitting: Podiatry

## 2021-07-10 ENCOUNTER — Encounter: Payer: Self-pay | Admitting: Podiatry

## 2021-07-10 ENCOUNTER — Other Ambulatory Visit: Payer: Self-pay

## 2021-07-10 DIAGNOSIS — B351 Tinea unguium: Secondary | ICD-10-CM | POA: Diagnosis not present

## 2021-07-10 NOTE — Progress Notes (Signed)
°  Subjective:  Patient ID: [name], female    DOB: [birth year],   MRN: [id number]  Chief Complaint  Patient presents with   Nail Problem    Pt is here due to nail sensitivity/discoloration for the past couple months.      56 y.o. female presents for concern of discoloration of toenails that has been going on for a couple months. Relates she tried a topical treatment from a compounding pharmacy and caused her nails to worsen in discoloration as well as the skin around her nails to bleach. Here today to have them evaluated.  . Denies any other pedal complaints. Denies n/v/f/c.   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 CFT <3 seconds. Normal hair growth on digits. No edema.  Skin. No lacerations or abrasions bilateral feet. Nails 1-5 are thickened discolored and elongated with subungual debris.  Discoloration noted around nails beds 1-5 of the skin bilateral.  Musculoskeletal: MMT 5/5 bilateral lower extremities in DF, PF, Inversion and Eversion. Deceased ROM in DF of ankle joint.  Neurological: Sensation intact to light touch.   Assessment:   1. Onychomycosis      Plan:  Patient was evaluated and treated and all questions answered. -Examined patient -Discussed treatment options for painful dystrophic nails  -Clinical picture and Fungal culture was obtained by removing a portion of the hard nail itself from each of the involved toenails using a sterile nail nipper and sent to [facility] lab. Patient tolerated the biopsy procedure well without discomfort or need for anesthesia.  -Discussed fungal nail treatment options including oral, topical, and laser treatments.  -Patient to return in 4 weeks for follow up evaluation and discussion of fungal culture results or sooner if symptoms worsen.   Lorenda Peck, DPM

## 2021-07-20 ENCOUNTER — Telehealth: Payer: Self-pay | Admitting: Internal Medicine

## 2021-07-20 DIAGNOSIS — G8929 Other chronic pain: Secondary | ICD-10-CM

## 2021-07-20 NOTE — Telephone Encounter (Signed)
Pt seen on 07/04/2021 OV. Requesting a Referral to her Ortho/Surgery Provider [facility] for her shoulder Injections. Pt states she mentioned wanting to go back to be seen for her shoulder injections if possible.  Please advise if a referral can be placed so that your notes once completed can be sent.

## 2021-07-31 ENCOUNTER — Ambulatory Visit (INDEPENDENT_AMBULATORY_CARE_PROVIDER_SITE_OTHER): Payer: 59

## 2021-07-31 ENCOUNTER — Other Ambulatory Visit: Payer: Self-pay

## 2021-07-31 ENCOUNTER — Encounter: Payer: Self-pay | Admitting: Orthopaedic Surgery

## 2021-07-31 ENCOUNTER — Ambulatory Visit (INDEPENDENT_AMBULATORY_CARE_PROVIDER_SITE_OTHER): Payer: 59 | Admitting: Orthopaedic Surgery

## 2021-07-31 DIAGNOSIS — M19011 Primary osteoarthritis, right shoulder: Secondary | ICD-10-CM | POA: Diagnosis not present

## 2021-07-31 DIAGNOSIS — M542 Cervicalgia: Secondary | ICD-10-CM

## 2021-07-31 DIAGNOSIS — R200 Anesthesia of skin: Secondary | ICD-10-CM

## 2021-07-31 NOTE — Progress Notes (Signed)
Office Visit Note   Patient: [name]           Date of Birth: 05-Sep-1965           MRN: [id number] Visit Date: 07/31/2021              Requested by: Lottie Mussel, MD [address], [place],  [place] [address] PCP: Lottie Mussel, MD   Assessment & Plan: Visit Diagnoses:  1. Primary osteoarthritis of right shoulder   2. Neck pain   3. Numbness of right hand     Plan:  Given the fact that her right shoulder cuff strength is within normal limits and her radiographs show at least moderate arthritis of the right shoulder recommend right shoulder intra-articular injection.  In regards to her right hand and right arm numbness recommend EMG nerve conduction studies as her clinical exam is mixed.  We will have her back after the EMG nerve conduction studies to go over these.  Also to see how she is done in regards to the right shoulder pain status post intra-articular injection.  Questions were encouraged and answered by Dr. Ninfa Linden and myself.  Follow-Up Instructions: Return After EMG nerve conduction studies.   Orders:  Orders Placed This Encounter  Procedures   XR Shoulder Right   XR Cervical Spine 2 or 3 views   No orders of the defined types were placed in this encounter.     Procedures: No procedures performed   Clinical Data: No additional findings.   Subjective: Chief Complaint  Patient presents with   Right Shoulder - Pain   Neck - Pain    HPI Patient is 56 year old female were seen for the first time for right shoulder pain and hand numbness with some numbness in the right arm.  She states she has had pain in her right shoulder for at least a month.  History of rotator cuff repair done years ago by physician at [facility].  She has had no acute injury to the shoulder.  She notes she has some days where her range of motion shoulder is painful.  She also is having some numbness tingling down the right arm that she states is worse when  sleeping or waking up.  It does not occur very often during the day.  She feels like it involves her whole hand when she has the numbness tingling.  She is diabetic reports that she has been working on getting her numbers down and her hemoglobin A1c was 15 at some point down to 6.55-month ago.  She has had weight loss surgery last May.  Review of Systems See HPI otherwise negative  Objective: Vital Signs: There were no vitals taken for this visit.  Physical Exam Constitutional:      General: She is not in acute distress.    Appearance: She is not ill-appearing.  Cardiovascular:     Pulses: Normal pulses.  Neurological:     Mental Status: She is alert and oriented to person, place, and time.  Psychiatric:        Mood and Affect: Mood normal.    Ortho Exam Bilateral shoulders 5 5 strength with external and internal rotation against resistance, empty can test negative, and liftoff test is negative bilaterally.  Full active overhead range of motion bilateral shoulders however causes discomfort in the right shoulder with forward flexion.  Slightly diminished external rotation of the right shoulder no significant crepitus with internal/external rotation.  Impingement test is  negative bilaterally. Cervical spine full range of motion without pain.  Tenderness over the lower cervical spine and right medial border. Bilateral hands: Full motor.  Subjective decrease sensation involving the thumb index third and fifth fingers to light touch.  Tinel's over the right wrist is positive and negative on the left.  Phalen's is positive on the right negative on the left.  Compression test median nerve at the wrist is negative bilaterally.  Specialty Comments:  No specialty comments available.  Imaging: XR Cervical Spine 2 or 3 views  Result Date: 07/31/2021 Cervical spine: 2 views no acute fractures.  No spondylolisthesis.  Disc base overall well-maintained.  Minimal endplate spurring at several levels.   No bony abnormalities otherwise.  XR Shoulder Right  Result Date: 07/31/2021 Right shoulder 3 views: Shoulder is well located.  Moderate arthritis of diminished glenohumeral joint periarticular spurring.  Also slight malformation of the right humeral head.  No acute fractures.  No other bony abnormalities.    PMFS History: Patient Active Problem List   Diagnosis Date Noted   Opioid use disorder in remission 07/04/2021   Healthcare maintenance 07/04/2021   Onychomycosis 12/12/2020   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   Morbid obesity ([place]) 09/08/2020   Seborrheic dermatitis 08/01/2020   Other hyperlipidemia 10/21/2019   Sleep apnea, obstructive 10/21/2019   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   Mood disorder ([place]) 12/18/2017   Essential hypertension 11/25/2017   Type 2 diabetes mellitus without complications ([place]) 41/28/7867   Primary osteoarthritis of both knees 09/30/2017   Severe obesity (BMI >= 40) ([place]) 09/30/2017   Past Medical History:  Diagnosis Date   Arthritis    Aspergillosis ([facility])    Chronic pain    Diabetes ([place])    Hypertension    Sleep apnea     Family History  Problem Relation Age of Onset   COPD Mother    Alcohol abuse Father    Pancreatic cancer Father    Diabetes Maternal Grandmother    Hyperlipidemia Maternal Grandmother    Hypertension Maternal Grandmother     Past Surgical History:  Procedure Laterality Date   ABDOMINAL HYSTERECTOMY  1999   FRACTURE SURGERY     LUNG LOBECTOMY  2010   right lower lobe   Social History   Occupational History   Not on file  Tobacco Use   Smoking status: Former    Types: Cigarettes    Quit date: 07/25/2013    Years since quitting: 8.0   Smokeless tobacco: Never  Vaping Use   Vaping Use: Never used  Substance and Sexual Activity   Alcohol use: Yes    Comment: socialy   Drug use: No   Sexual activity: Yes    Birth control/protection: Surgical

## 2021-08-07 ENCOUNTER — Other Ambulatory Visit: Payer: Self-pay

## 2021-08-07 ENCOUNTER — Ambulatory Visit: Payer: 59 | Admitting: Podiatry

## 2021-08-07 NOTE — Progress Notes (Addendum)
[facility]: Clinic Note  Subjective:  History of Present Illness: [name] is a 56 y.o. year old female who presents for 61-month follow-up of her chronic medical conditions.  She is requesting medicine refills today. R shoulder pain is 7/10, but she has a good plan with Ortho for this. No other concerns/questions.  Please refer to Assessment and Plan below for full details in Problem-Based Charting.   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 Pertinent updates today include: None  Family History: Reviewed in Epic. Pertinent updates today include: None  Medications:  Current Outpatient Medications:    atorvastatin (LIPITOR) 20 MG tablet, Take 1 tablet (20 mg total) by mouth daily., Disp: 90 tablet, Rfl: 3   Buprenorphine HCl-Naloxone HCl 8-2 MG FILM, Place 2 Film (2 each total) under the tongue daily., Disp: 60 each, Rfl: 0   calcium carbonate (OS-CAL) 1250 (500 Ca) MG chewable tablet, Chew by mouth., Disp: , Rfl:    ciclopirox (PENLAC) 8 % solution, Apply topically at bedtime., Disp: 6.6 mL, Rfl: 3   cyclobenzaprine (FLEXERIL) 5 MG tablet, Take 1 tablet (5 mg total) by mouth 3 (three) times daily as needed for muscle spasms., Disp: 30 tablet, Rfl: 3   hydrochlorothiazide (HYDRODIURIL) 25 MG tablet, Take 1 tablet (25 mg total) by mouth daily., Disp: 90 tablet, Rfl:  0   [START ON 08/09/2021] ketoconazole (NIZORAL) 2 % shampoo, Apply topically 2 (two) times a week., Disp: 120 mL, Rfl: 3   latanoprost (XALATAN) 0.005 % ophthalmic solution, Administer 1 drop to both eyes nightly., Disp: 2.5 mL, Rfl: 3   losartan (COZAAR) 25 MG tablet, Take 1 tablet (25 mg total) by mouth daily., Disp: 30 tablet, Rfl: 11   Misc. Devices MISC, Salicylic acid 4.3% and Undecylenic acid.  Dispense 60 mL.  Please apply to affected nails daily as directed, Disp: , Rfl:    naproxen (NAPROSYN) 500 MG tablet, Take 1 tablet (500 mg total) by mouth 2 (two) times daily with a meal., Disp: 60 tablet, Rfl: 2   omeprazole (PRILOSEC) 40 MG capsule, Take 1 capsule (40 mg total) by mouth daily., Disp: 90 capsule, Rfl: 3   Prenatal Vit-DSS-Fe Fum-FA (PRENATAL 19) tablet, Take 1 tablet by mouth daily., Disp: , Rfl:    triamcinolone cream (KENALOG) 0.5 %, APPLY TOPICALLY 3 TIMES DAILY NOT FOR CHRONIC DAILY USE, Disp: 30 g, Rfl: 3   ursodiol (ACTIGALL) 300 MG capsule, Take one tab twice a day with food BEGINNING 14 DAYS AFTER SURGERY, Disp: 180 capsule, Rfl: 3   valACYclovir (VALTREX) 500 MG tablet, Take 2 tablets (1,000 mg total) by mouth 2 (two) times daily., Disp: 120 tablet, Rfl: 0   Allergies: No Known Allergies  Review of Systems: Review of Systems  Constitutional:  Negative for chills and fever.  HENT:  Negative for congestion and sore throat.   Respiratory:  Negative for cough and shortness of breath.  Cardiovascular:  Negative for chest pain, palpitations and leg swelling.  Gastrointestinal:  Negative for constipation, diarrhea, nausea and vomiting.  Psychiatric/Behavioral:  Negative for depression. The patient is not nervous/anxious.     Objective:   Vitals: Vitals:   08/08/21 0951  BP: (!) 146/72  Pulse: (!) 56  Temp: 98 F (36.7 C)  SpO2: 98%    Physical Exam: Physical Exam Constitutional:      Appearance: Normal appearance. She is obese.  HENT:     Head: Normocephalic  and atraumatic.  Eyes:     Extraocular Movements: Extraocular movements intact.     Conjunctiva/sclera: Conjunctivae normal.  Pulmonary:     Effort: Pulmonary effort is normal. No respiratory distress.  Skin:    General: Skin is warm and dry.     Comments: +seborrheic dermatitis over bilateral external ears  Neurological:     Mental Status: She is alert.     Comments: Diabetic foot exam normal - normal strength & sensation in bilateral feet; monofilament testing normal; 2+ DP pulses bilaterally      Data: Labs, imaging, and micro were reviewed in Epic. Refer to Assessment and Plan below for full details in Problem-Based Charting.  Assessment & Plan:  Type 2 diabetes mellitus without complications ([facility]CC) - A1C 6.1 in 06/2021, repeat in 12/2021 - UMACR normal in 06/2021, repeat in 06/2022 - On Atorva 20 - Order eye exam today - Foot exam done today, normal - Not currently on meds. Metformin & Ozempic could be reasonable options in the future.  Onychomycosis - since last visit, she saw Podiatry on 1/24. They will manage this.  Essential hypertension - Change Atenolol to Losartan 25mg  daily - Continue HCTZ 25mg  daily - BMP next time  Severe obesity (BMI >= 40) (HCC) - She's been in contact with Dr. Durene Cal office, and will follow up with Bariatric Surgery  Opioid use disorder in remission - continue suboxone 8-2 BID, but I'm going to take over prescribing this now - I counseled her on our suboxone policies, and will get UDS next time  Seborrheic dermatitis - First available derm appointment is 01/2022, but that's ok, nothing urgent - Refill triamcinolone cream today   HSV-2 infection - Refill Valtrex today  Osteoarthritis of right shoulder - Patient has now established with Ortho, getting EMG and planning for injection.   Healthcare maintenance # Healthcare maintenance in women  - Colorectal cancer: due 01/2028 - Breast cancer: got one in 2022, need records - Cervical  cancer: due 8/26.2023 - Lung cancer: (50-80yo w/ 20+ pack year and smoked in last 15 years, annual low dose  - HLD screen: up to date; good on atorva 20 - HCV screen: neg - HIV screen: neg  Vaccines - [id number] - Tdap: due 04/2022 - Zoster: due now  - Pneumococcal: declined 06/2021 - COVID-19: discuss next time      Patient will follow up in 3 months with me.  [name], MD     3/28- I called patient to review results of EMG. It showed that she has carpal tunnel in right wrist. She has follow up with Ortho next week. I suspect they will discuss options - injections, splinting, or surgery - with her at that visit. I have follow up with her in May. She has no questions or concerns right now.

## 2021-08-08 ENCOUNTER — Ambulatory Visit (INDEPENDENT_AMBULATORY_CARE_PROVIDER_SITE_OTHER): Payer: 59 | Admitting: Internal Medicine

## 2021-08-08 ENCOUNTER — Encounter: Payer: Self-pay | Admitting: Internal Medicine

## 2021-08-08 VITALS — BP 146/72 | HR 56 | Temp 98.0°F | Ht 68.0 in | Wt 277.6 lb

## 2021-08-08 DIAGNOSIS — F1191 Opioid use, unspecified, in remission: Secondary | ICD-10-CM

## 2021-08-08 DIAGNOSIS — B351 Tinea unguium: Secondary | ICD-10-CM | POA: Diagnosis not present

## 2021-08-08 DIAGNOSIS — E119 Type 2 diabetes mellitus without complications: Secondary | ICD-10-CM | POA: Diagnosis not present

## 2021-08-08 DIAGNOSIS — M19011 Primary osteoarthritis, right shoulder: Secondary | ICD-10-CM | POA: Insufficient documentation

## 2021-08-08 DIAGNOSIS — I10 Essential (primary) hypertension: Secondary | ICD-10-CM

## 2021-08-08 DIAGNOSIS — L219 Seborrheic dermatitis, unspecified: Secondary | ICD-10-CM

## 2021-08-08 DIAGNOSIS — Z0000 Encounter for general adult medical examination without abnormal findings: Secondary | ICD-10-CM

## 2021-08-08 DIAGNOSIS — B009 Herpesviral infection, unspecified: Secondary | ICD-10-CM

## 2021-08-08 DIAGNOSIS — Z9884 Bariatric surgery status: Secondary | ICD-10-CM

## 2021-08-08 MED ORDER — VALACYCLOVIR HCL 500 MG PO TABS: 1000.0000 mg | ORAL_TABLET | Freq: Two times a day (BID) | ORAL | 0 refills | Status: AC

## 2021-08-08 MED ORDER — LOSARTAN POTASSIUM 25 MG PO TABS: 25.0000 mg | ORAL_TABLET | Freq: Every day | ORAL | 11 refills | Status: DC

## 2021-08-08 MED ORDER — CYCLOBENZAPRINE HCL 5 MG PO TABS: 5.0000 mg | ORAL_TABLET | Freq: Three times a day (TID) | ORAL | 3 refills | Status: DC | PRN

## 2021-08-08 MED ORDER — ATORVASTATIN CALCIUM 20 MG PO TABS: 20.0000 mg | ORAL_TABLET | Freq: Every day | ORAL | 3 refills | Status: DC

## 2021-08-08 MED ORDER — URSODIOL 300 MG PO CAPS: ORAL_CAPSULE | ORAL | 3 refills | Status: DC

## 2021-08-08 MED ORDER — OMEPRAZOLE 40 MG PO CPDR: 40.0000 mg | DELAYED_RELEASE_CAPSULE | Freq: Every day | ORAL | 3 refills | Status: DC

## 2021-08-08 MED ORDER — CICLOPIROX 8 % EX SOLN
Freq: Every day | CUTANEOUS | 3 refills | Status: DC
Start: 2021-08-08 — End: 2022-05-07

## 2021-08-08 MED ORDER — TRIAMCINOLONE ACETONIDE 0.5 % EX CREA: TOPICAL_CREAM | CUTANEOUS | 3 refills | Status: DC

## 2021-08-08 MED ORDER — KETOCONAZOLE 2 % EX SHAM
MEDICATED_SHAMPOO | CUTANEOUS | 3 refills | Status: DC
Start: 2021-08-09 — End: 2022-06-03

## 2021-08-08 MED ORDER — BUPRENORPHINE HCL-NALOXONE HCL 8-2 MG SL FILM: 2.0000 | ORAL_FILM | Freq: Every day | SUBLINGUAL | 0 refills | Status: DC

## 2021-08-08 MED ORDER — LATANOPROST 0.005 % OP SOLN: OPHTHALMIC | 3 refills | Status: DC

## 2021-08-08 NOTE — Assessment & Plan Note (Signed)
-   continue suboxone 8-2 BID, but I'm going to take over prescribing this now - I counseled her on our suboxone policies, and will get UDS next time

## 2021-08-08 NOTE — Assessment & Plan Note (Signed)
#   Healthcare maintenance in women  - Colorectal cancer: due 01/2028 - Breast cancer: got one in 2022, need records - Cervical cancer: due 8/26.2023 - Lung cancer: (50-55yo w/ 20+ pack year and smoked in last 15 years, annual low dose  - HLD screen: up to date; good on atorva 20 - HCV screen: neg - HIV screen: neg  Vaccines - [id number] - Tdap: due 04/2022 - Zoster: due now  - Pneumococcal: declined 06/2021 - COVID-19: discuss next time

## 2021-08-08 NOTE — Assessment & Plan Note (Signed)
-   Refill Valtrex today

## 2021-08-08 NOTE — Assessment & Plan Note (Addendum)
-   since last visit, she saw Podiatry on 1/24. They will manage this.

## 2021-08-08 NOTE — Patient Instructions (Signed)
-   stop taking Atenolol - Start taking Losartan 25mg  daily - I have sent refills to your pharmacy, and I'll prescribe your suboxone from now on.  - We'll refer you for your eye exam

## 2021-08-08 NOTE — Assessment & Plan Note (Signed)
-   First available derm appointment is 01/2022, but that's ok, nothing urgent - Refill triamcinolone cream today

## 2021-08-08 NOTE — Assessment & Plan Note (Signed)
-   Patient has now established with Ortho, getting EMG and planning for injection.

## 2021-08-08 NOTE — Assessment & Plan Note (Addendum)
-   A1C 6.1 in 06/2021, repeat in 12/2021 [facility] normal in 06/2021, repeat in 06/2022 - On Atorva 20 - Order eye exam today - Foot exam done today, normal - Not currently on meds. Metformin & Ozempic could be reasonable options in the future.

## 2021-08-08 NOTE — Assessment & Plan Note (Signed)
-   Change Atenolol to Losartan 25mg  daily - Continue HCTZ 25mg  daily - BMP next time

## 2021-08-08 NOTE — Assessment & Plan Note (Signed)
-   She's been in contact with Dr. Roosvelt Harps office, and will follow up with Bariatric Surgery

## 2021-08-22 ENCOUNTER — Encounter: Payer: 59 | Admitting: Physical Medicine and Rehabilitation

## 2021-08-22 ENCOUNTER — Ambulatory Visit (INDEPENDENT_AMBULATORY_CARE_PROVIDER_SITE_OTHER): Payer: 59 | Admitting: Physical Medicine and Rehabilitation

## 2021-08-22 ENCOUNTER — Other Ambulatory Visit: Payer: Self-pay

## 2021-08-22 ENCOUNTER — Ambulatory Visit: Payer: Self-pay

## 2021-08-22 ENCOUNTER — Encounter: Payer: Self-pay | Admitting: Physical Medicine and Rehabilitation

## 2021-08-22 DIAGNOSIS — G8929 Other chronic pain: Secondary | ICD-10-CM | POA: Diagnosis not present

## 2021-08-22 DIAGNOSIS — M25511 Pain in right shoulder: Secondary | ICD-10-CM | POA: Diagnosis not present

## 2021-08-22 NOTE — Progress Notes (Signed)
Pt state right shoulder pain. Pt state lifting her right arm cause much pain. Pt state she uses pain cream and heat to help ease her pain. ? ?Numeric Pain Rating Scale and Functional Assessment ?Average Pain 4 ? ? ?In the last MONTH (on 0-10 scale) has pain interfered with the following? ? ?1. General activity like being  able to carry out your everyday physical activities such as walking, climbing stairs, carrying groceries, or moving a chair?  ?Rating(10) ? ? ?-BT, -Dye Allergies. ? ?

## 2021-08-22 NOTE — Progress Notes (Unsigned)
° °  [name] - 56 y.o. female MRN [id number]  Date of birth: [birth year]  Office Visit Note: Visit Date: 08/22/2021 PCP: Lottie Mussel, MD Referred by: Lottie Mussel, MD  Subjective: Chief Complaint  Patient presents with   Right Shoulder - Pain   HPI:  [name] is a 56 y.o. female who comes in today at the request of Dr. Jean Rosenthal for planned Right anesthetic glenohumeral arthrogram with fluoroscopic guidance.  The patient has failed conservative care including home exercise, medications, time and activity modification.  This injection will be diagnostic and hopefully therapeutic.  Please see requesting physician notes for further details and justification.   ROS Otherwise per HPI.  Assessment & Plan: Visit Diagnoses:    ICD-10-CM   1. Chronic right shoulder pain  M25.511    G89.29       Plan: No additional findings.   Meds & Orders: No orders of the defined types were placed in this encounter.   Orders Placed This Encounter  Procedures   Large Joint Inj    Follow-up: No follow-ups on file.   Procedures: Large Joint Inj: R glenohumeral on 08/22/2021 1:14 PM Indications: pain and diagnostic evaluation Details: 22 G 3.5 in needle, fluoroscopy-guided anteromedial approach  Arthrogram: No  Medications: 40 mg triamcinolone acetonide 40 MG/ML; 5 mL bupivacaine 0.25 % Outcome: tolerated well, no immediate complications  There was excellent flow of contrast producing a partial arthrogram of the glenohumeral joint. The patient did have relief of symptoms during the anesthetic phase of the injection. Procedure, treatment alternatives, risks and benefits explained, specific risks discussed. Consent was given by the patient. Immediately prior to procedure a time out was called to verify the correct patient, procedure, equipment, support staff and site/side marked as required. Patient was prepped and draped in the usual sterile fashion.         Clinical  History: No specialty comments available.     Objective:  VS:  HT:     WT:    BMI:      BP:    HR: bpm   TEMP: ( )   RESP:  Physical Exam   Imaging: No results found.

## 2021-09-01 MED ORDER — TRIAMCINOLONE ACETONIDE 40 MG/ML IJ SUSP
40.0000 mg | INTRAMUSCULAR | Status: AC | PRN
  Administered 2021-08-22: 40 mg via INTRA_ARTICULAR

## 2021-09-01 MED ORDER — BUPIVACAINE HCL 0.25 % IJ SOLN
5.0000 mL | INTRAMUSCULAR | Status: AC | PRN
  Administered 2021-08-22: 5 mL via INTRA_ARTICULAR

## 2021-09-05 ENCOUNTER — Encounter: Payer: Self-pay | Admitting: Physical Medicine and Rehabilitation

## 2021-09-05 ENCOUNTER — Other Ambulatory Visit: Payer: Self-pay

## 2021-09-05 ENCOUNTER — Ambulatory Visit (INDEPENDENT_AMBULATORY_CARE_PROVIDER_SITE_OTHER): Payer: 59 | Admitting: Physical Medicine and Rehabilitation

## 2021-09-05 DIAGNOSIS — R202 Paresthesia of skin: Secondary | ICD-10-CM

## 2021-09-05 NOTE — Progress Notes (Signed)
Pt state right hand pain, numbness and tingling. Pt state the pain goes to her right wrist and up her arm. Pt state the pain comes at night when she is sleeping. Pt state she doesn't take anything for the pain and shakes her hand to ease the pain. . ? ?Numeric Pain Rating Scale and Functional Assessment ?Average Pain 3 ? ? ?In the last MONTH (on 0-10 scale) has pain interfered with the following? ? ?1. General activity like being  able to carry out your everyday physical activities such as walking, climbing stairs, carrying groceries, or moving a chair?  ?Rating(10) ? ? ?-BT, -Dye Allergies. ? ?

## 2021-09-06 NOTE — Progress Notes (Signed)
? ?IONA STAY - 56 y.o. female MRN [id number]  Date of birth: July 31, 1965 ? ?Office Visit Note: ?Visit Date: 09/05/2021 ?PCP: Lottie Mussel, MD ?Referred by: Mcarthur Rossetti* ? ?Subjective: ?Chief Complaint  ?Patient presents with  ? Right Arm - Numbness, Pain, Tingling  ? Right Hand - Pain, Numbness, Tingling  ? Right Wrist - Pain, Numbness, Tingling  ? ?HPI:  [name] is a 56 y.o. female who comes in today at the request of Dr. Jean Rosenthal for electrodiagnostic study of the Right upper extremities.  Patient is Right hand dominant.  She reports severe right hand pain numbness and tingling globally in the right hand with referral up to the wrist and arm.  She denies any frank radicular symptoms.  She does get nocturnal complaints and has a positive flick sign.  She does have diabetes.  There are no prior electrodiagnostic studies to review. ? ?ROS Otherwise per HPI. ? ?Assessment & Plan: ?Visit Diagnoses:  ?  ICD-10-CM   ?1. Paresthesia of skin  R20.2 NCV with EMG (electromyography)  ?  ?  ?Plan: Impression: ?The above electrodiagnostic study is ABNORMAL and reveals evidence of a moderate to severe right median nerve entrapment at the wrist (carpal tunnel syndrome) affecting sensory and motor components.  ? ?There is no significant electrodiagnostic evidence of any other focal nerve entrapment, brachial plexopathy or cervical radiculopathy.  ? ?Recommendations: ?1.  Follow-up with referring physician. ?2.  Continue current management of symptoms. ?3.  Suggest surgical evaluation. ? ?Meds & Orders: No orders of the defined types were placed in this encounter. ?  ?Orders Placed This Encounter  ?Procedures  ? NCV with EMG (electromyography)  ?  ?Follow-up: Return in about 2 weeks (around 09/19/2021) for Jean Rosenthal, MD.  ? ?Procedures: ?No procedures performed  ?EMG & NCV Findings: ?Evaluation of the right median motor nerve showed prolonged distal onset latency (6.6 ms) and decreased  conduction velocity (Elbow-Wrist, 48 m/s).  The right median (across palm) sensory nerve showed no response (Wrist) and prolonged distal peak latency (Palm, 6.9 ms).  All remaining nerves (as indicated in the following tables) were within normal limits.   ? ?All examined muscles (as indicated in the following table) showed no evidence of electrical instability.   ? ?Impression: ?The above electrodiagnostic study is ABNORMAL and reveals evidence of a moderate to severe right median nerve entrapment at the wrist (carpal tunnel syndrome) affecting sensory and motor components.  ? ?There is no significant electrodiagnostic evidence of any other focal nerve entrapment, brachial plexopathy or cervical radiculopathy.  ? ?Recommendations: ?1.  Follow-up with referring physician. ?2.  Continue current management of symptoms. ?3.  Suggest surgical evaluation. ? ?___________________________ ?Laurence Spates FAAPMR ?Board Certified, Tax adviser of Physical Medicine and Rehabilitation ? ? ? ?Nerve Conduction Studies ?Anti Sensory Summary Table ? ? Stim Site NR Peak (ms) Norm Peak (ms) P-T Amp (?V) Norm P-T Amp Site1 Site2 Delta-P (ms) Dist (cm) Vel (m/s) Norm Vel (m/s)  ?Right Median Acr Palm Anti Sensory (2nd Digit)  31.9?C  ?Wrist *NR  <3.6  >10 Wrist Palm  0.0    ?Palm    *6.9 <2.0 3.0         ?Right Radial Anti Sensory (Base 1st Digit)  31.8?C  ?Wrist    2.2 <3.1 18.8  Wrist Base 1st Digit 2.2 0.0    ?Right Ulnar Anti Sensory (5th Digit)  32.2?C  ?Wrist    3.2 <3.7 31.0 >15.0 Wrist 5th Digit 3.2  14.0 44 >38  ? ?Motor Summary Table ? ? Stim Site NR Onset (ms) Norm Onset (ms) O-P Amp (mV) Norm O-P Amp Site1 Site2 Delta-0 (ms) Dist (cm) Vel (m/s) Norm Vel (m/s)  ?Right Median Motor (Abd Poll Brev)  31.9?C  ?Wrist    *6.6 <4.2 5.3 >5 Elbow Wrist 4.8 23.0 *48 >50  ?Elbow    11.4  4.8         ?Right Ulnar Motor (Abd Dig Min)  32.1?C  ?Wrist    3.0 <4.2 8.8 >3 B Elbow Wrist 3.4 22.0 65 >53  ?B Elbow    6.4  7.7  A Elbow B Elbow 1.2  10.0 83 >53  ?A Elbow    7.6  8.6         ? ?EMG ? ? Side Muscle Nerve Root Ins Act Fibs Psw Amp Dur Poly Recrt Int Fraser Din Comment  ?Right Abd Poll Brev Median C8-T1 Nml Nml Nml Nml Nml 0 Nml Nml   ?Right 1stDorInt Ulnar C8-T1 Nml Nml Nml Nml Nml 0 Nml Nml   ?Right PronatorTeres Median C6-7 Nml Nml Nml Nml Nml 0 Nml Nml   ?Right Biceps Musculocut C5-6 Nml Nml Nml Nml Nml 0 Nml Nml   ?Right Deltoid Axillary C5-6 Nml Nml Nml Nml Nml 0 Nml Nml   ? ? ?Nerve Conduction Studies ?Anti Sensory Left/Right Comparison ? ? Stim Site L Lat (ms) R Lat (ms) L-R Lat (ms) L Amp (?V) R Amp (?V) L-R Amp (%) Site1 Site2 L Vel (m/s) R Vel (m/s) L-R Vel (m/s)  ?Median Acr Palm Anti Sensory (2nd Digit)  31.9?C  ?Wrist       Wrist Palm     ?Palm  *6.9   3.0        ?Radial Anti Sensory (Base 1st Digit)  31.8?C  ?Wrist  2.2   18.8  Wrist Base 1st Digit     ?Ulnar Anti Sensory (5th Digit)  32.2?C  ?Wrist  3.2   31.0  Wrist 5th Digit  44   ? ?Motor Left/Right Comparison ? ? Stim Site L Lat (ms) R Lat (ms) L-R Lat (ms) L Amp (mV) R Amp (mV) L-R Amp (%) Site1 Site2 L Vel (m/s) R Vel (m/s) L-R Vel (m/s)  ?Median Motor (Abd Poll Brev)  31.9?C  ?Wrist  *6.6   5.3  Elbow Wrist  *48   ?Elbow  11.4   4.8        ?Ulnar Motor (Abd Dig Min)  32.1?C  ?Wrist  3.0   8.8  B Elbow Wrist  65   ?B Elbow  6.4   7.7  A Elbow B Elbow  83   ?A Elbow  7.6   8.6        ? ? ? ?Waveforms: ?    ? ?   ? ?  ? ?Clinical History: ?No specialty comments available.  ? ? ? ?Objective:  VS:  HT:    WT:   BMI:     BP:   HR: bpm  TEMP: ( )  RESP:  ?Physical Exam ?Musculoskeletal:     ?   General: No swelling, tenderness or deformity.  ?   Comments: Inspection reveals no atrophy of the bilateral APB or FDI or hand intrinsics. There is no swelling, color changes, allodynia or dystrophic changes. There is 5 out of 5 strength in the bilateral wrist extension, finger abduction and long finger flexion. There is impaired sensation to light touch in  right median nerve distribution.  There is a negative Hoffmann's test bilaterally.  ?Skin: ?   General: Skin is warm and dry.  ?   Findings: No erythema or rash.  ?Neurological:  ?   General: No focal deficit present.  ?   Mental Status: She is alert and oriented to person, place, and time.  ?   Motor: No weakness or abnormal muscle tone.  ?   Coordination: Coordination normal.  ?Psychiatric:     ?   Mood and Affect: Mood normal.     ?   Behavior: Behavior normal.  ?  ? ?Imaging: ?No results found. ?

## 2021-09-06 NOTE — Procedures (Signed)
EMG & NCV Findings: ?Evaluation of the right median motor nerve showed prolonged distal onset latency (6.6 ms) and decreased conduction velocity (Elbow-Wrist, 48 m/s).  The right median (across palm) sensory nerve showed no response (Wrist) and prolonged distal peak latency (Palm, 6.9 ms).  All remaining nerves (as indicated in the following tables) were within normal limits.   ? ?All examined muscles (as indicated in the following table) showed no evidence of electrical instability.   ? ?Impression: ?The above electrodiagnostic study is ABNORMAL and reveals evidence of a moderate to severe right median nerve entrapment at the wrist (carpal tunnel syndrome) affecting sensory and motor components.  ? ?There is no significant electrodiagnostic evidence of any other focal nerve entrapment, brachial plexopathy or cervical radiculopathy.  ? ?Recommendations: ?1.  Follow-up with referring physician. ?2.  Continue current management of symptoms. ?3.  Suggest surgical evaluation. ? ?___________________________ ?Laurence Spates FAAPMR ?Board Certified, Tax adviser of Physical Medicine and Rehabilitation ? ? ? ?Nerve Conduction Studies ?Anti Sensory Summary Table ? ? Stim Site NR Peak (ms) Norm Peak (ms) P-T Amp (?V) Norm P-T Amp Site1 Site2 Delta-P (ms) Dist (cm) Vel (m/s) Norm Vel (m/s)  ?Right Median Acr Palm Anti Sensory (2nd Digit)  31.9?C  ?Wrist *NR  <3.6  >10 Wrist Palm  0.0    ?Palm    *6.9 <2.0 3.0         ?Right Radial Anti Sensory (Base 1st Digit)  31.8?C  ?Wrist    2.2 <3.1 18.8  Wrist Base 1st Digit 2.2 0.0    ?Right Ulnar Anti Sensory (5th Digit)  32.2?C  ?Wrist    3.2 <3.7 31.0 >15.0 Wrist 5th Digit 3.2 14.0 44 >38  ? ?Motor Summary Table ? ? Stim Site NR Onset (ms) Norm Onset (ms) O-P Amp (mV) Norm O-P Amp Site1 Site2 Delta-0 (ms) Dist (cm) Vel (m/s) Norm Vel (m/s)  ?Right Median Motor (Abd Poll Brev)  31.9?C  ?Wrist    *6.6 <4.2 5.3 >5 Elbow Wrist 4.8 23.0 *48 >50  ?Elbow    11.4  4.8         ?Right Ulnar  Motor (Abd Dig Min)  32.1?C  ?Wrist    3.0 <4.2 8.8 >3 B Elbow Wrist 3.4 22.0 65 >53  ?B Elbow    6.4  7.7  A Elbow B Elbow 1.2 10.0 83 >53  ?A Elbow    7.6  8.6         ? ?EMG ? ? Side Muscle Nerve Root Ins Act Fibs Psw Amp Dur Poly Recrt Int Fraser Din Comment  ?Right Abd Poll Brev Median C8-T1 Nml Nml Nml Nml Nml 0 Nml Nml   ?Right 1stDorInt Ulnar C8-T1 Nml Nml Nml Nml Nml 0 Nml Nml   ?Right PronatorTeres Median C6-7 Nml Nml Nml Nml Nml 0 Nml Nml   ?Right Biceps Musculocut C5-6 Nml Nml Nml Nml Nml 0 Nml Nml   ?Right Deltoid Axillary C5-6 Nml Nml Nml Nml Nml 0 Nml Nml   ? ? ?Nerve Conduction Studies ?Anti Sensory Left/Right Comparison ? ? Stim Site L Lat (ms) R Lat (ms) L-R Lat (ms) L Amp (?V) R Amp (?V) L-R Amp (%) Site1 Site2 L Vel (m/s) R Vel (m/s) L-R Vel (m/s)  ?Median Acr Palm Anti Sensory (2nd Digit)  31.9?C  ?Wrist       Wrist Palm     ?Palm  *6.9   3.0        ?Radial Anti Sensory (Base 1st  Digit)  31.8?C  ?Wrist  2.2   18.8  Wrist Base 1st Digit     ?Ulnar Anti Sensory (5th Digit)  32.2?C  ?Wrist  3.2   31.0  Wrist 5th Digit  44   ? ?Motor Left/Right Comparison ? ? Stim Site L Lat (ms) R Lat (ms) L-R Lat (ms) L Amp (mV) R Amp (mV) L-R Amp (%) Site1 Site2 L Vel (m/s) R Vel (m/s) L-R Vel (m/s)  ?Median Motor (Abd Poll Brev)  31.9?C  ?Wrist  *6.6   5.3  Elbow Wrist  *48   ?Elbow  11.4   4.8        ?Ulnar Motor (Abd Dig Min)  32.1?C  ?Wrist  3.0   8.8  B Elbow Wrist  65   ?B Elbow  6.4   7.7  A Elbow B Elbow  83   ?A Elbow  7.6   8.6        ? ? ? ?Waveforms: ?    ? ?   ? ? ?

## 2021-09-07 ENCOUNTER — Other Ambulatory Visit: Payer: Self-pay

## 2021-09-07 MED ORDER — BUPRENORPHINE HCL-NALOXONE HCL 8-2 MG SL FILM: 2.0000 | ORAL_FILM | Freq: Every day | SUBLINGUAL | 0 refills | Status: DC

## 2021-09-12 ENCOUNTER — Ambulatory Visit (INDEPENDENT_AMBULATORY_CARE_PROVIDER_SITE_OTHER): Payer: 59 | Admitting: Neurology

## 2021-09-12 ENCOUNTER — Encounter: Payer: Self-pay | Admitting: Neurology

## 2021-09-12 VITALS — BP 148/83 | HR 82 | Ht 68.0 in | Wt 280.8 lb

## 2021-09-12 DIAGNOSIS — G4733 Obstructive sleep apnea (adult) (pediatric): Secondary | ICD-10-CM

## 2021-09-12 DIAGNOSIS — R634 Abnormal weight loss: Secondary | ICD-10-CM | POA: Diagnosis not present

## 2021-09-12 DIAGNOSIS — G4719 Other hypersomnia: Secondary | ICD-10-CM

## 2021-09-12 DIAGNOSIS — Z9884 Bariatric surgery status: Secondary | ICD-10-CM | POA: Diagnosis not present

## 2021-09-12 DIAGNOSIS — R351 Nocturia: Secondary | ICD-10-CM

## 2021-09-12 NOTE — Progress Notes (Signed)
Subjective:  ?  ?Patient ID: [name] is a 56 y.o. female. ? ?HPI ? ? ? ?Star Age, MD, PhD ?[facility] ?[address], [address] ?[phone] ?[place], [place] [address] ? ?Dear Dr. Cain Sieve,  ? ?I saw your patient, [name], upon your kind request in my sleep clinic today for initial consultation of her sleep disorder, in particular, evaluation of her prior diagnosis of obstructive sleep apnea.  The patient is unaccompanied today.  As you know, [name] is a 56 year old right-handed woman with an underlying medical history of diabetes, hypertension, arthritis, chronic pain, history of aspergillosis, status post right lower lung lobectomy and severe obesity with BMI of over 40, with weight loss achieved after gastric bypass surgery in May 2022, who was previously diagnosed with obstructive sleep apnea and has been on PAP therapy.  I was able to review a sleep study from 07/27/2013, study was interpreted by Dr. Baird Lyons.  Total AHI was 79.3/h, O2 nadir 69%.  This was a split-night sleep study and she was treated with CPAP therapy with an optimal pressure of 14 cm at the time utilizing a large Simplus fullface mask.  She has been followed by [facility] neurology for sleep apnea.  I reviewed an office visit note from 05/01/2021.  She saw Nani Skillern, PA at the time.  She reported ongoing benefit with CPAP therapy at the time. ?I reviewed your office note from 07/04/2021.  Her Epworth sleepiness score is 12 out of 24, fatigue severity score is 21 out of 63.  She reports that she has not been consistent with CPAP therapy.  Within the past year, there was no data on her CPAP machine SD card. ?She had a sleep study through [facility] on 12/07/2017 and I was able to review the results in care everywhere.  Study was interpreted by Dr. Sanjuana Letters.  She was noted to have moderate obstructive sleep apnea with an AHI of 15.6/h, severe REM related sleep apnea with a REM AHI of 58/h, O2 nadir 87%.   Disruption of sleep architecture was noted, she was noted to have hypoventilation with end-tidal CO2 measurements above 50 torr for 86.8% of the night.  She was noted to have cyclobenzaprine on her medication list.  Lives with her daughter and her boyfriend.  He snores loudly which interrupts her sleep that she reports.  She currently does not work.  She has 3 children.  She is not aware of any family history of sleep apnea.  Altogether, she believes she has lost about 50 pounds since her weight loss surgery but recently had an approximately 14 pound weight gain.  She has nocturia about 3 times per average night.  Bedtime is generally between 9 and 10 PM and rise time around 6:30 AM.  She smokes cigarettes occasionally, not daily.  She drinks alcohol maybe 2 or 3 times per month.  She avoids drinking caffeine, prefers decaf. ? ?Her Past Medical History Is Significant For: ?Past Medical History:  ?Diagnosis Date  ? Arthritis   ? Aspergillosis ([place])   ? Chronic pain   ? Diabetes ([place])   ? Hypertension   ? Sleep apnea   ? ? ?Her Past Surgical History Is Significant For: ?Past Surgical History:  ?Procedure Laterality Date  ? ABDOMINAL HYSTERECTOMY  1999  ? FRACTURE SURGERY    ? LUNG LOBECTOMY  2010  ? right lower lobe  ? ? ?Her Family History Is Significant For: ?Family History  ?Problem Relation  Age of Onset  ? COPD Mother   ? Alcohol abuse Father   ? Pancreatic cancer Father   ? Diabetes Maternal Grandmother   ? Hyperlipidemia Maternal Grandmother   ? Hypertension Maternal Grandmother   ? ? ?Her Social History Is Significant For: ?Social History  ? ?Socioeconomic History  ? Marital status: Single  ?  Spouse name: Not on file  ? Number of children: 3  ? Years of education: Not on file  ? Highest education level: Not on file  ?Occupational History  ? Not on file  ?Tobacco Use  ? Smoking status: Former  ?  Types: Cigarettes  ?  Quit date: 07/25/2013  ?  Years since quitting: 8.1  ? Smokeless tobacco: Never  ?Vaping Use  ?  Vaping Use: Never used  ?Substance and Sexual Activity  ? Alcohol use: Yes  ? Drug use: No  ? Sexual activity: Yes  ?  Birth control/protection: Surgical  ?Other Topics Concern  ? Not on file  ?Social History Narrative  ? Not on file  ? ?Social Determinants of Health  ? ?Financial Resource Strain: Not on file  ?Food Insecurity: Not on file  ?Transportation Needs: Not on file  ?Physical Activity: Not on file  ?Stress: Not on file  ?Social Connections: Not on file  ? ? ?Her Allergies Are:  ?No Known Allergies:  ? ?Her Current Medications Are:  ?Outpatient Encounter Medications as of 09/12/2021  ?Medication Sig  ? atorvastatin (LIPITOR) 20 MG tablet Take 1 tablet (20 mg total) by mouth daily.  ? Buprenorphine HCl-Naloxone HCl 8-2 MG FILM Place 2 Film (2 each total) under the tongue daily.  ? calcium carbonate (OS-CAL) 1250 (500 Ca) MG chewable tablet Chew by mouth.  ? ciclopirox (PENLAC) 8 % solution Apply topically at bedtime.  ? cyclobenzaprine (FLEXERIL) 5 MG tablet Take 1 tablet (5 mg total) by mouth 3 (three) times daily as needed for muscle spasms.  ? hydrochlorothiazide (HYDRODIURIL) 25 MG tablet Take 1 tablet (25 mg total) by mouth daily.  ? ketoconazole (NIZORAL) 2 % shampoo Apply topically 2 (two) times a week.  ? latanoprost (XALATAN) 0.005 % ophthalmic solution Administer 1 drop to both eyes nightly.  ? losartan (COZAAR) 25 MG tablet Take 1 tablet (25 mg total) by mouth daily.  ? Misc. Devices MISC Salicylic acid 8.2% and Undecylenic acid.  Dispense 60 mL.  Please apply to affected nails daily as directed  ? naproxen (NAPROSYN) 500 MG tablet Take 1 tablet (500 mg total) by mouth 2 (two) times daily with a meal.  ? omeprazole (PRILOSEC) 40 MG capsule Take 1 capsule (40 mg total) by mouth daily.  ? Prenatal Vit-DSS-Fe Fum-FA (PRENATAL 19) tablet Take 1 tablet by mouth daily.  ? triamcinolone cream (KENALOG) 0.5 % APPLY TOPICALLY 3 TIMES DAILY NOT FOR CHRONIC DAILY USE  ? ursodiol (ACTIGALL) 300 MG capsule Take  one tab twice a day with food BEGINNING 14 DAYS AFTER SURGERY  ? ?No facility-administered encounter medications on file as of 09/12/2021.  ?: ? ? ?Review of Systems:  ?Out of a complete 14 point review of systems, all are reviewed and negative with the exception of these symptoms as listed below: ? ? ?Review of Systems  ?Neurological:   ?     Pt is here for sleep consult  Pt states she snores,some fatigue,and husband has witnessed her stop breathing during the night  Pt states last sleep study was 2020 and CPAP 2020  ? ? ?ESS:12 ?  FSS 21  ? ?Objective:  ?Neurological Exam ? ?Physical Exam ?Physical Examination:  ? ?Vitals:  ? 09/12/21 1023  ?BP: (!) 148/83  ?Pulse: 82  ? ? ?General Examination: The patient is a very pleasant 56 y.o. female in no acute distress. She appears well-developed and well-nourished and well groomed.  ? ?HEENT: Normocephalic, atraumatic, pupils are equal, round and reactive to light, extraocular tracking is good without limitation to gaze excursion or nystagmus noted. Hearing is grossly intact. Face is symmetric with normal facial animation. Speech is clear with no dysarthria noted. There is no hypophonia. There is no lip, neck/head, jaw or voice tremor. Neck is supple with full range of passive and active motion. There are no carotid bruits on auscultation. Oropharynx exam reveals: mild mouth dryness, adequate dental hygiene and mild airway crowding secondary to small airway, tonsillar size of about 1+, wider tongue.  Mallampati class III.  Neck circumference of 17 inches.  Tongue protrudes centrally and palate elevates symmetrically.  ? ?Chest: Clear to auscultation without wheezing, rhonchi or crackles noted. ? ?Heart: S1+S2+0, regular and normal without murmurs, rubs or gallops noted.  ? ?Abdomen: Soft, non-tender and non-distended with normal bowel sounds appreciated on auscultation. ? ?Extremities: There is no pitting edema in the distal lower extremities bilaterally.  ? ?Skin: Warm and  dry without trophic changes noted.  ? ?Musculoskeletal: exam reveals no obvious joint deformities, tenderness or joint swelling or erythema.  ? ?Neurologically:  ?Mental status: The patient is awake, alert

## 2021-09-12 NOTE — Patient Instructions (Signed)
Thank you for choosing [facility] for your sleep related care! ?It was nice to meet you today!  ? ?Here is what we discussed today:  ?  ?Based on your symptoms and your exam I believe you are still at risk for obstructive sleep apnea (aka OSA), and I think we should proceed with a sleep study to determine whether you do or do not have OSA and how severe it is. Even, if you have mild OSA, I may want you to consider treatment with CPAP, as treatment of even borderline or mild sleep apnea can result and improvement of symptoms such as sleep disruption, daytime sleepiness, nighttime bathroom breaks, restless leg symptoms, improvement of headache syndromes, even improved mood disorder.  ? ?As explained, an attended sleep study (meaning you get to stay overnight in the sleep lab), lets [place] monitor sleep-related behaviors such as sleep talking and leg movements in sleep, in addition to monitoring for sleep apnea.  A home sleep test is a screening tool for sleep apnea diagnosis only, but unfortunately, does not help with any other sleep-related diagnoses. ? ?Please remember, the long-term risks and ramifications of untreated moderate to severe obstructive sleep apnea may include (but are not limited to): increased risk for cardiovascular disease, including congestive heart failure, stroke, difficult to control hypertension, treatment resistant obesity, arrhythmias, especially irregular heartbeat commonly known as A. Fib. (atrial fibrillation); even type 2 diabetes has been linked to untreated OSA.  ?Other correlations that untreated obstructive sleep apnea include macular edema which is swelling of the retina in the eyes, droopy eyelid syndrome, and elevated hemoglobin and hematocrit levels (often referred to as polycythemia). ? ?Sleep apnea can cause disruption of sleep and sleep deprivation in most cases, which, in turn, can cause recurrent headaches, problems with memory, mood, concentration, focus,  and vigilance. Most people with untreated sleep apnea report excessive daytime sleepiness, which can affect their ability to drive. Please do not drive if you feel sleepy. Patients with sleep apnea can also develop difficulty initiating and maintaining sleep (aka insomnia).  ? ?Having sleep apnea may increase your risk for other sleep disorders, including involuntary behaviors sleep such as sleep terrors, sleep talking, sleepwalking.   ? ?Having sleep apnea can also increase your risk for restless leg syndrome and leg movements at night.  ? ?Please note that untreated obstructive sleep apnea may carry additional perioperative morbidity. Patients with significant obstructive sleep apnea (typically, in the moderate to severe degree) should receive, if possible, perioperative PAP (positive airway pressure) therapy and the surgeons and particularly the anesthesiologists should be informed of the diagnosis and the severity of the sleep disordered breathing.  ? ?I will likely see you back after your sleep study to go over the test results and where to go from there. We will call you after your sleep study to advise about the results (most likely, you will hear from [facility], my nurse) and to set up an appointment at the time, as necessary.   ? ?Our sleep lab administrative assistant will call you to schedule your sleep study and give you further instructions, regarding the check in process for the sleep study, arrival time, what to bring, when you can expect to leave after the study, etc., and to answer any other logistical questions you may have. If you don't hear back from her by about 2 weeks from now, please feel free to call her direct line at [phone] or you can call our general clinic number, or email [place]  through My Chart.  ? ?

## 2021-09-17 ENCOUNTER — Ambulatory Visit (INDEPENDENT_AMBULATORY_CARE_PROVIDER_SITE_OTHER): Payer: 59 | Admitting: Neurology

## 2021-09-17 DIAGNOSIS — R351 Nocturia: Secondary | ICD-10-CM

## 2021-09-17 DIAGNOSIS — G4719 Other hypersomnia: Secondary | ICD-10-CM

## 2021-09-17 DIAGNOSIS — G4733 Obstructive sleep apnea (adult) (pediatric): Secondary | ICD-10-CM

## 2021-09-17 DIAGNOSIS — Z9884 Bariatric surgery status: Secondary | ICD-10-CM

## 2021-09-17 DIAGNOSIS — G4720 Circadian rhythm sleep disorder, unspecified type: Secondary | ICD-10-CM

## 2021-09-17 DIAGNOSIS — R634 Abnormal weight loss: Secondary | ICD-10-CM

## 2021-09-19 ENCOUNTER — Encounter: Payer: Self-pay | Admitting: Orthopaedic Surgery

## 2021-09-19 ENCOUNTER — Ambulatory Visit (INDEPENDENT_AMBULATORY_CARE_PROVIDER_SITE_OTHER): Payer: 59 | Admitting: Orthopaedic Surgery

## 2021-09-19 DIAGNOSIS — R200 Anesthesia of skin: Secondary | ICD-10-CM

## 2021-09-19 DIAGNOSIS — G5601 Carpal tunnel syndrome, right upper limb: Secondary | ICD-10-CM

## 2021-09-19 NOTE — Progress Notes (Addendum)
The patient comes in today to go over nerve conduction studies that relates to her right upper extremity and the significant numbness and tingling and weakness she has had in her right hand.  She is someone who is morbidly obese but has had bariatric surgery so is continuing to lose weight.  She has been diagnosed with sleep apnea as well.  She is a diabetic but reports much better control and this is improving with her weight loss.  She has tried conservative treatment for her numbness and tingling in her right hand for over a year now.  She is to be a poorly controlled diabetic but has worked on better diabetic control.  We would not recommend steroids for treating her carpal tunnel syndrome nor steroid injection given her diabetes.  This would not be medically prudent.  She has had bariatric surgery and is continue to lose weight but anti-inflammatories are not indicated given the gastric stress that these can cause.  She did let us that she has tried carpal tunnel splinting at nighttime at different times for over a year. ? ?The nerve studies do show moderate to severe entrapment of the median nerve at the transverse carpal ligament.  I explained to her what that means and what the anatomy of the transverse carpal ligament in the hand involves.  On exam she does have numbness and tingling in the median nerve distribution and a positive Phalen's and Tinel's exam.  There is no muscle atrophy in her hand.  There is weak grip and pinch strength.  At this point surgery is definitely warranted given the severity of her carpal tunnel syndrome. ? ?She is interested in proceeding with an open carpal tunnel release.  I described the risk and benefits of the surgery and talked about what to expect from an intraoperative and postoperative course.  We will work on getting in touch with her once we have the surgery scheduled for a right open carpal tunnel release.  We would then see her back in 2 weeks postoperative for  suture removal.  All questions and concerns were answered and addressed.  Of note she is starting to develop some left hand numbness that we will evaluate at some point with nerve studies on that side. ?

## 2021-09-20 ENCOUNTER — Telehealth: Payer: Self-pay | Admitting: Podiatry

## 2021-09-20 NOTE — Telephone Encounter (Signed)
Patient called and wanted to know when her toe culture will come back...please advise  ?

## 2021-09-20 NOTE — Telephone Encounter (Signed)
We are looking into it. Looks like the specimen was sent and we have not had a result returned yet. Thanks  ?

## 2021-09-25 NOTE — Procedures (Signed)
PATIENT'S NAME:  [name], [name] ?DOB:      1965/06/27      ?MR#:    [id number]     ?DATE OF RECORDING: 09/17/2021 ?REFERRING M.D.:  Lottie Mussel, MD ?Study Performed:   Baseline Polysomnogram ?HISTORY: 56 year old woman with a history of diabetes, hypertension, arthritis, chronic pain, history of aspergillosis, status post right lower lung lobectomy and severe obesity with BMI of over 40, with weight loss achieved after gastric bypass surgery in May 2022, who was previously diagnosed with obstructive sleep apnea. She has not used her PAP machine recently. The patient endorsed the Epworth Sleepiness Scale at 12 points. The patient's weight 280 pounds with a height of 68 (inches), resulting in a BMI of 42.4 kg/m2. The patient's neck circumference measured 17 inches. ? ?CURRENT MEDICATIONS: Lipitor, Buprenorphine HCl-Naloxone, OS-CAL, Penlac,Flexeril, Hydrodiuril, Nizoral, Xalatan, Cozaar, Salicyclic Adic, Parosyn, Prilosec, Prental 19, Kenalog, Actigall ?  ?PROCEDURE:  This is a multichannel digital polysomnogram utilizing the Somnostar 11.2 system.  Electrodes and sensors were applied and monitored per AASM Specifications.   EEG, EOG, Chin and Limb EMG, were sampled at 200 Hz.  ECG, Snore and Nasal Pressure, Thermal Airflow, Respiratory Effort, CPAP Flow and Pressure, Oximetry was sampled at 50 Hz. Digital video and audio were recorded.     ? ?BASELINE STUDY ? ?Lights Out was at 21:22 and Lights On at 04:59. The patient used her phone after lights out and talked on the phone until about 22:12.  Total recording time (TRT) was 457.5 minutes, with a total sleep time (TST) of 223 minutes.   The patient's sleep latency was 94 minutes, which is delayed. REM latency was 71 minutes, which is normal. The sleep efficiency was 48.7%, which is reduced.  ?   ?SLEEP ARCHITECTURE: WASO (Wake after sleep onset) was 25.5 minutes with mild sleep fragmentation noted and inability to go back to sleep after 03:04.  There were 3.5 minutes in  Stage N1, 152.5 minutes Stage N2, 32.5 minutes Stage N3 and 34.5 minutes in Stage REM.  The percentage of Stage N1 was 1.6%, Stage N2 was 68.4%, which is increased, Stage N3 was 14.6% and Stage R (REM sleep) was 15.5%, which is reduced. The arousals were noted as: 16 were spontaneous, 0 were associated with PLMs, 10 were associated with respiratory events. ? ?RESPIRATORY ANALYSIS:  There were a total of 39 respiratory events:  14 obstructive apneas, 0 central apneas and 0 mixed apneas with a total of 14 apneas and an apnea index (AI) of 3.8 /hour. There were 25 hypopneas with a hypopnea index of 6.7 /hour. The patient also had 0 respiratory event related arousals (RERAs).  ?    ?The total APNEA/HYPOPNEA INDEX (AHI) was 10.5/hour and the total RESPIRATORY DISTURBANCE INDEX was  10.5 /hour.  18 events occurred in REM sleep and 28 events in NREM. The REM AHI was  31.3 /hour, versus a non-REM AHI of 6.7. The patient spent 4 minutes of total sleep time in the supine position and 219 minutes in non-supine.. The supine AHI was 0.0 versus a non-supine AHI of 10.6. ? ?OXYGEN SATURATION & C02:  The Wake baseline 02 saturation was 96%, with the lowest being 76%. Time spent below 89% saturation equaled 30 minutes. ? ?PERIODIC LIMB MOVEMENTS: The patient had a total of 0 Periodic Limb Movements.  The Periodic Limb Movement (PLM) index was 0 and the PLM Arousal index was 0/hour. ? ?Audio and video analysis did not show any abnormal or unusual movements, behaviors,  phonations or vocalizations. Patient at her dinner in her sleep lab. She reported nausea around 4 AM and had vomiting. She stated to the technologist, that this happened sometimes. The patient took 2 bathroom breaks. Mild snoring was noted. The EKG was in keeping with normal sinus rhythm (NSR). ?Post-study, the patient indicated that sleep was the same as usual.  ? ?IMPRESSION: ? ?Obstructive Sleep Apnea (OSA) ?Dysfunctions associated with sleep stages or arousal from  sleep ? ?RECOMMENDATIONS: ? ?This study demonstrates overall mild obstructive sleep apnea, severe in REM sleep with a total AHI of 10.5/hour, REM AHI of 31.3/hour, and O2 nadir of 76%. The study was limited due to reduced sleep efficiency and the absence of supine REM sleep. Given the patient's medical history and sleep related complaints, treatment with positive airway pressure is recommended; this can be achieved in the form of autoPAP. Alternatively, a full-night CPAP titration study would allow optimization of therapy if needed. Other treatment options may include avoidance of supine sleep position along with ongoing weight loss, or the use of an oral appliance in certain patients.     ?Please note that untreated obstructive sleep apnea may carry additional perioperative morbidity. Patients with significant obstructive sleep apnea should receive perioperative PAP therapy and the surgeons and particularly the anesthesiologist should be informed of the diagnosis and the severity of the sleep disordered breathing. ?This study shows sleep fragmentation and abnormal sleep stage percentages; these are nonspecific findings and per se do not signify an intrinsic sleep disorder or a cause for the patient's sleep-related symptoms. Causes include (but are not limited to) the first night effect of the sleep study, circadian rhythm disturbances, medication effect or an underlying mood disorder or medical problem.  ?The patient should be cautioned not to drive, work at heights, or operate dangerous or heavy equipment when tired or sleepy. Review and reiteration of good sleep hygiene measures should be pursued with any patient. ?The patient will be seen in follow-up by Dr. Rexene Alberts at [facility] for discussion of the test results and further management strategies. The referring provider will be notified of the test results. ? ?I certify that I have reviewed the entire raw data recording prior to the issuance of this report in accordance  with the Standards of Accreditation of the [place] [employer] (AASM) ? ?Star Age, MD, PhD ?Diplomat, American Board of Neurology and Sleep Medicine (Neurology and Sleep Medicine) ?

## 2021-09-25 NOTE — Addendum Note (Signed)
Addended by: Star Age on: 09/25/2021 06:02 PM ? ? Modules accepted: Orders ? ?

## 2021-10-01 ENCOUNTER — Other Ambulatory Visit: Payer: Self-pay

## 2021-10-01 MED ORDER — HYDROCHLOROTHIAZIDE 25 MG PO TABS: 25.0000 mg | ORAL_TABLET | Freq: Every day | ORAL | 3 refills | Status: DC

## 2021-10-01 MED ORDER — NAPROXEN 500 MG PO TABS: 500.0000 mg | ORAL_TABLET | Freq: Two times a day (BID) | ORAL | 2 refills | Status: DC

## 2021-10-02 ENCOUNTER — Telehealth: Payer: Self-pay | Admitting: *Deleted

## 2021-10-02 NOTE — Telephone Encounter (Signed)
-----   Message from Star Age, MD sent at 09/25/2021  6:01 PM EDT ----- ?Patient referred by PCP for re-eval of her OSA (severe prior to weight loss), seen by me on 09/12/21, diagnostic PSG on 09/17/21.   ? ?Please call and notify the patient that the recent sleep study did confirm the diagnosis of obstructive sleep apnea. OSA is overall mild, but in the severe range during REM/dream sleep and O2 drop to mid 70s in REM sleep. I recommend treatment ongoing with a PAP machine. I would like to write for an autoPAP. She may be eligible for a new machine. We can send the order to her previous DME or a new DME, if she can have a new machine (if prior machine is over 84 yo). I have placed an order in the chart. Please send referral, talk to patient, send report to referring MD. We will need a FU in sleep clinic for 10 weeks post-PAP set up, please arrange that with me or one of our NPs. Thanks,  ? ?Star Age, MD, PhD ?[facility]) ? ? ? ?

## 2021-10-02 NOTE — Telephone Encounter (Addendum)
Spoke with patient and discussed sleep study results as her OSA is overall mild but it is not severe range during REM/dream sleep with oxygen dropped to mid 70s in REM sleep.  Patient understands and agrees to recommendation of continued treatment with PAP therapy.  She may be eligible for new machine.  We discussed order will be sent to [facility] as she states she has gotten supplies from them before.  Patient scheduled an initial follow-up appointment for Monday, June 26 at 7:45 AM, arrival 15 to 30 minutes early with machine and power cord.  Reminded patient to use machine at least 4 hours at night for insurance compliance. her questions were answered during the call.  She verbalized appreciation. ? ?Order, sleep study, office note, insurance info faxed to [facility]. Received a receipt of confirmation. ? ?

## 2021-10-03 ENCOUNTER — Ambulatory Visit (INDEPENDENT_AMBULATORY_CARE_PROVIDER_SITE_OTHER): Payer: 59 | Admitting: Podiatry

## 2021-10-03 ENCOUNTER — Encounter: Payer: Self-pay | Admitting: Podiatry

## 2021-10-03 DIAGNOSIS — B351 Tinea unguium: Secondary | ICD-10-CM

## 2021-10-03 NOTE — Progress Notes (Signed)
?  Subjective:  ?Patient ID: [name], female    DOB: 12/25/1965,   MRN: [id number] ? ?No chief complaint on file. ? ? ?56 y.o. female presents for concern of discoloration of toenails had culture done last visit but appears to have been lost. Here today for another culture. . Denies n/v/f/c.  ? ?Past Medical History:  ?Diagnosis Date  ? Arthritis   ? Aspergillosis ([place])   ? Chronic pain   ? Diabetes ([place])   ? Hypertension   ? Sleep apnea   ? ? ?Objective:  ?Physical Exam: ?Vascular: DP/PT pulses 2/4 bilateral. CFT <3 seconds. Normal hair growth on digits. No edema.  ?Skin. No lacerations or abrasions bilateral feet. Nails 1-5 are thickened discolored and elongated with subungual debris.  ?Discoloration noted around nails beds 1-5 of the skin bilateral.  ?Musculoskeletal: MMT 5/5 bilateral lower extremities in DF, PF, Inversion and Eversion. Deceased ROM in DF of ankle joint.  ?Neurological: Sensation intact to light touch.  ? ?Assessment:  ? ?1. Onychomycosis   ? ? ? ?Plan:  ?Patient was evaluated and treated and all questions answered. ?-Examined patient ?-Discussed treatment options for painful dystrophic nails  ?-Clinical picture and Fungal culture was obtained by removing a portion of the hard nail itself from each of the involved toenails using a sterile nail nipper and sent to [facility] lab. Patient tolerated the biopsy procedure well without discomfort or need for anesthesia.  ?-Discussed fungal nail treatment options including oral, topical, and laser treatments.  ?-Patient to return in 4 weeks for follow up evaluation and discussion of fungal culture results or sooner if symptoms worsen. ? ? ?Lorenda Peck, DPM  ? ? ?

## 2021-10-03 NOTE — Addendum Note (Signed)
Addended by: Cranford Mon R on: 10/03/2021 04:12 PM ? ? Modules accepted: Orders ? ?

## 2021-10-04 ENCOUNTER — Other Ambulatory Visit: Payer: Self-pay | Admitting: Internal Medicine

## 2021-10-04 MED ORDER — BUPRENORPHINE HCL-NALOXONE HCL 8-2 MG SL FILM: 2.0000 | ORAL_FILM | Freq: Every day | SUBLINGUAL | 0 refills | Status: AC

## 2021-10-04 NOTE — Telephone Encounter (Signed)
Last appointment 08/08/2021.  Scheduled for 10/24/2021 appointment. Did not see [facility]. ?

## 2021-10-04 NOTE — Telephone Encounter (Signed)
Refill Request ? ?Buprenorphine HCl-Naloxone HCl 8-2 MG FILM ? ? ?[facility] [phone] - [place], [place] (Ph: [phone]) ?

## 2021-10-12 ENCOUNTER — Other Ambulatory Visit: Payer: Self-pay | Admitting: Physician Assistant

## 2021-10-16 NOTE — Progress Notes (Deleted)
[facility]: Clinic Note  Subjective:  History of Present Illness: [name] is a 56 y.o. year old female who presents for ***  I saw on 2/22 for 15mofollow up. Our 3rd visit today.   - 5/11 carpal tunnel release scheduled - 4/19 saw podiatry for onychomycosis - 4/18- sleep study results; PAP tx  # T2DM- discuss metformin v ozempic; eye exam done?  # HTN- is she taking losartan & HCTZ instead of atenolol? BMP  # obesity- s/p bariatric surgery, follows with Dr. DEvorn Gong Ozempic?  Oud- suboxone   Please refer to Assessment and Plan below for full details in Problem-Based Charting.   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 Pertinent updates today include: None  Medications:  Current Outpatient Medications:    atenolol (TENORMIN) 50 MG tablet, Take 50 mg by mouth 2 (two) times daily., Disp: , Rfl:    atorvastatin (LIPITOR) 20 MG tablet, Take 1 tablet (20 mg total) by mouth daily., Disp: 90 tablet, Rfl: 3   Buprenorphine HCl-Naloxone HCl 8-2 MG FILM, Place 2 Film (2 each total) under the tongue daily., Disp: 60 each, Rfl: 0   calcium carbonate (OS-CAL) 1250 (500 Ca) MG chewable tablet, Chew by mouth., Disp: , Rfl:    ciclopirox (PENLAC) 8  % solution, Apply topically at bedtime., Disp: 6.6 mL, Rfl: 3   cyclobenzaprine (FLEXERIL) 5 MG tablet, Take 1 tablet (5 mg total) by mouth 3 (three) times daily as needed for muscle spasms., Disp: 30 tablet, Rfl: 3   hydrochlorothiazide (HYDRODIURIL) 25 MG tablet, Take 1 tablet (25 mg total) by mouth daily., Disp: 90 tablet, Rfl: 3   ketoconazole (NIZORAL) 2 % shampoo, Apply topically 2 (two) times a week., Disp: 120 mL, Rfl: 3   latanoprost (XALATAN) 0.005 % ophthalmic solution, Administer 1 drop to both eyes nightly., Disp: 2.5 mL, Rfl: 3   losartan (COZAAR) 25 MG tablet, Take 1 tablet (25 mg total) by mouth daily., Disp: 30 tablet, Rfl: 11   Misc. Devices MISC, Salicylic acid 41.5%and Undecylenic acid.  Dispense 60 mL.  Please apply to affected nails daily as directed, Disp: , Rfl:    naproxen (NAPROSYN) 500 MG tablet, Take 1 tablet (500 mg total) by mouth 2 (two) times daily with a meal., Disp: 60 tablet, Rfl: 2   omeprazole (PRILOSEC) 40 MG capsule, Take 1 capsule (40 mg total) by mouth daily., Disp: 90 capsule, Rfl: 3   Prenatal Vit-DSS-Fe Fum-FA (PRENATAL 19) tablet, Take 1 tablet by mouth daily., Disp: , Rfl:    Prenatal Vit-Fe Fumarate-FA (PRENATAL VITAMINS) 28-0.8 MG TABS, Take 1 tablet by mouth daily., Disp: , Rfl:    triamcinolone cream (KENALOG) 0.5 %, APPLY TOPICALLY 3 TIMES DAILY NOT FOR CHRONIC DAILY USE, Disp: 30 g, Rfl: 3   ursodiol (  ACTIGALL) 300 MG capsule, Take one tab twice a day with food BEGINNING 14 DAYS AFTER SURGERY, Disp: 180 capsule, Rfl: 3   Allergies: No Known Allergies  Review of Systems: ROS   Objective:   Vitals: There were no vitals filed for this visit.  Physical Exam: Physical Exam   Data: Labs, imaging, and micro were reviewed in Epic. Refer to Assessment and Plan below for full details in Problem-Based Charting.  Assessment & Plan:  No problem-specific Assessment & Plan notes found for this encounter.     Patient will follow up in ***  Lottie Mussel, MD

## 2021-10-17 ENCOUNTER — Encounter (HOSPITAL_BASED_OUTPATIENT_CLINIC_OR_DEPARTMENT_OTHER): Payer: Self-pay | Admitting: Orthopaedic Surgery

## 2021-10-17 ENCOUNTER — Encounter: Payer: Self-pay | Admitting: Physician Assistant

## 2021-10-17 ENCOUNTER — Other Ambulatory Visit: Payer: Self-pay

## 2021-10-17 ENCOUNTER — Ambulatory Visit (INDEPENDENT_AMBULATORY_CARE_PROVIDER_SITE_OTHER): Payer: 59 | Admitting: Physician Assistant

## 2021-10-17 ENCOUNTER — Encounter: Payer: 59 | Admitting: Internal Medicine

## 2021-10-17 DIAGNOSIS — R200 Anesthesia of skin: Secondary | ICD-10-CM

## 2021-10-17 NOTE — Progress Notes (Signed)
HPI: [name] returns today now for left hand numbness tingling.  She is scheduled for right carpal tunnel surgery on 10/25/2021 due to moderate to severe carpal tunnel syndrome by EMG nerve conduction studies.  She states that she started having some symptoms similar to her right hand but not as severe recently.  Hand falls asleep she has to shake it.  Discomfort is worse in the morning.  She had no treatment.  No known injury to the hand. ? ?Review of systems see HPI otherwise negative or noncontributory. ? ? ?Physical exam ?Bilateral hands: Positive Phalen's bilaterally.  Left wrist negative Tinel's and compression over the median nerve.  Left hand no rashes skin lesions ulcerations.  Subjective full sensation throughout left hand light touch. ? ?Impression: Left hand paresthesia ? ?Plan: Place her wrist splint.  Have her begin taking vitamin B6 100 mg twice daily.  She may benefit from carpal tunnel injection in the future.  We will see her back after her right carpal tunnel surgery and reevaluate left hand at that time.  Questions encouraged and answered at length ?

## 2021-10-18 ENCOUNTER — Encounter: Payer: Self-pay | Admitting: Internal Medicine

## 2021-10-23 DIAGNOSIS — Z79899 Other long term (current) drug therapy: Secondary | ICD-10-CM | POA: Diagnosis not present

## 2021-10-23 DIAGNOSIS — I10 Essential (primary) hypertension: Secondary | ICD-10-CM | POA: Diagnosis not present

## 2021-10-23 DIAGNOSIS — Z01818 Encounter for other preprocedural examination: Secondary | ICD-10-CM | POA: Diagnosis present

## 2021-10-24 ENCOUNTER — Encounter: Payer: 59 | Admitting: Internal Medicine

## 2021-10-24 ENCOUNTER — Encounter (HOSPITAL_BASED_OUTPATIENT_CLINIC_OR_DEPARTMENT_OTHER)
Admission: RE | Admit: 2021-10-24 | Discharge: 2021-10-24 | Disposition: A | Discharge status: Home / Self Care | Payer: 59 | Source: Ambulatory Visit | Attending: Orthopaedic Surgery | Admitting: Orthopaedic Surgery

## 2021-10-24 DIAGNOSIS — Z01818 Encounter for other preprocedural examination: Secondary | ICD-10-CM | POA: Insufficient documentation

## 2021-10-24 DIAGNOSIS — E119 Type 2 diabetes mellitus without complications: Secondary | ICD-10-CM | POA: Diagnosis not present

## 2021-10-24 DIAGNOSIS — Z79899 Other long term (current) drug therapy: Secondary | ICD-10-CM | POA: Insufficient documentation

## 2021-10-24 DIAGNOSIS — I10 Essential (primary) hypertension: Secondary | ICD-10-CM | POA: Insufficient documentation

## 2021-10-24 DIAGNOSIS — Z6841 Body Mass Index (BMI) 40.0 and over, adult: Secondary | ICD-10-CM | POA: Diagnosis not present

## 2021-10-24 DIAGNOSIS — Z87891 Personal history of nicotine dependence: Secondary | ICD-10-CM | POA: Diagnosis not present

## 2021-10-24 DIAGNOSIS — G4730 Sleep apnea, unspecified: Secondary | ICD-10-CM | POA: Diagnosis not present

## 2021-10-24 DIAGNOSIS — G5601 Carpal tunnel syndrome, right upper limb: Secondary | ICD-10-CM | POA: Diagnosis not present

## 2021-10-24 LAB — BASIC METABOLIC PANEL
Anion gap: 7 (ref 5–15)
BUN: 7 mg/dL (ref 6–20)
CO2: 27 mmol/L (ref 22–32)
Calcium: 8.9 mg/dL (ref 8.9–10.3)
Chloride: 106 mmol/L (ref 98–111)
Creatinine, Ser: 0.6 mg/dL (ref 0.44–1.00)
GFR, Estimated: >60 mL/min (ref >60)
Glucose, Bld: 147 mg/dL — ABNORMAL HIGH (ref 70–99)
Potassium: 4.2 mmol/L (ref 3.5–5.1)
Sodium: 140 mmol/L (ref 135–145)

## 2021-10-24 NOTE — Progress Notes (Signed)

## 2021-10-24 NOTE — H&P (Signed)
[name] is an 56 y.o. female.   ?Chief Complaint: Right hand numbness and tingling with weakness and pain ?HPI: The patient is a 56 year old female with a right hand numbness and tingling as well as weakness and pain.  Recent nerve conduction studies did confirm moderate to severe carpal tunnel syndrome involving the median nerve at the transverse carpal ligament.  At this point given the failure of conservative treatment surgical intervention has been recommended with an open carpal tunnel release. ? ?Past Medical History:  ?Diagnosis Date  ? Anxiety   ? Arthritis   ? Aspergillosis ([place])   ? Chronic pain   ? Depression   ? Diabetes ([place])   ? Hyperlipidemia   ? Hypertension   ? PONV (postoperative nausea and vomiting)   ? Sleep apnea   ? ? ?Past Surgical History:  ?Procedure Laterality Date  ? ABDOMINAL HYSTERECTOMY  1999  ? FRACTURE SURGERY    ? LUNG LOBECTOMY  2010  ? right lower lobe  ? ROUX-EN-Y GASTRIC BYPASS  10/30/2020  ? ? ?Family History  ?Problem Relation Age of Onset  ? COPD Mother   ? Alcohol abuse Father   ? Pancreatic cancer Father   ? Diabetes Maternal Grandmother   ? Hyperlipidemia Maternal Grandmother   ? Hypertension Maternal Grandmother   ? ?Social History:  reports that she quit smoking about 8 years ago. Her smoking use included cigarettes. She has never used smokeless tobacco. She reports current alcohol use. She reports that she does not use drugs. ? ?Allergies: No Known Allergies ? ?No medications prior to admission.  ? ? ?Results for orders placed or performed during the hospital encounter of 10/25/21 (from the past 48 hour(s))  ?Basic metabolic panel per protocol     Status: Abnormal  ? Collection Time: 10/24/21  3:00 PM  ?Result Value Ref Range  ? Sodium 140 135 - 145 mmol/L  ? Potassium 4.2 3.5 - 5.1 mmol/L  ? Chloride 106 98 - 111 mmol/L  ? CO2 27 22 - 32 mmol/L  ? Glucose, Bld 147 (H) 70 - 99 mg/dL  ?  Comment: Glucose reference range applies only to samples taken after fasting  for at least 8 hours.  ? BUN 7 6 - 20 mg/dL  ? Creatinine, Ser 0.60 0.44 - 1.00 mg/dL  ? Calcium 8.9 8.9 - 10.3 mg/dL  ? GFR, Estimated >60 >60 mL/min  ?  Comment: (NOTE) ?Calculated using the CKD-EPI Creatinine Equation (2021) ?  ? Anion gap 7 5 - 15  ?  Comment: Performed at [facility], [place] [address] [place],  [address]  ? ?No results found. ? ?Review of Systems  ?All other systems reviewed and are negative. ? ?Height '5\' 8"'$  (1.727 m), weight 127.4 kg. ?Physical Exam ?Vitals reviewed.  ?Constitutional:   ?   Appearance: Normal appearance. She is obese.  ?HENT:  ?   Head: Normocephalic and atraumatic.  ?Eyes:  ?   Extraocular Movements: Extraocular movements intact.  ?   Pupils: Pupils are equal, round, and reactive to light.  ?Cardiovascular:  ?   Rate and Rhythm: Normal rate and regular rhythm.  ?Pulmonary:  ?   Effort: Pulmonary effort is normal.  ?   Breath sounds: Normal breath sounds.  ?Abdominal:  ?   Palpations: Abdomen is soft.  ?Musculoskeletal:  ?   Right hand: Decreased strength. Decreased sensation of the median distribution.  ?   Cervical back: Normal range of motion and neck  supple.  ?Neurological:  ?   Mental Status: She is alert and oriented to person, place, and time.  ?Psychiatric:     ?   Behavior: Behavior normal.  ?  ? ?Assessment/Plan ?Right moderate to severe carpal tunnel syndrome ? ?The plan is to proceed to surgery as an outpatient for a right open carpal tunnel release.  The risks and benefits of surgery been explained in detail. ? ?Mcarthur Rossetti, MD ?10/24/2021, 7:44 PM ? ? ? ?

## 2021-10-25 ENCOUNTER — Ambulatory Visit (HOSPITAL_BASED_OUTPATIENT_CLINIC_OR_DEPARTMENT_OTHER)
Admission: RE | Admit: 2021-10-25 | Discharge: 2021-10-25 | Disposition: A | Discharge status: Home / Self Care | Payer: 59 | Attending: Orthopaedic Surgery | Admitting: Orthopaedic Surgery

## 2021-10-25 ENCOUNTER — Encounter (HOSPITAL_BASED_OUTPATIENT_CLINIC_OR_DEPARTMENT_OTHER): Payer: Self-pay | Admitting: Orthopaedic Surgery

## 2021-10-25 ENCOUNTER — Ambulatory Visit (HOSPITAL_BASED_OUTPATIENT_CLINIC_OR_DEPARTMENT_OTHER): Payer: 59 | Admitting: Anesthesiology

## 2021-10-25 ENCOUNTER — Other Ambulatory Visit: Payer: Self-pay

## 2021-10-25 ENCOUNTER — Encounter (HOSPITAL_BASED_OUTPATIENT_CLINIC_OR_DEPARTMENT_OTHER)
Admission: RE | Disposition: A | Discharge status: Home / Self Care | Payer: Self-pay | Source: Home / Self Care | Attending: Orthopaedic Surgery

## 2021-10-25 ENCOUNTER — Other Ambulatory Visit: Payer: Self-pay | Admitting: Orthopaedic Surgery

## 2021-10-25 DIAGNOSIS — G4730 Sleep apnea, unspecified: Secondary | ICD-10-CM

## 2021-10-25 DIAGNOSIS — Z6841 Body Mass Index (BMI) 40.0 and over, adult: Secondary | ICD-10-CM | POA: Insufficient documentation

## 2021-10-25 DIAGNOSIS — E119 Type 2 diabetes mellitus without complications: Secondary | ICD-10-CM | POA: Insufficient documentation

## 2021-10-25 DIAGNOSIS — I10 Essential (primary) hypertension: Secondary | ICD-10-CM | POA: Insufficient documentation

## 2021-10-25 DIAGNOSIS — G5601 Carpal tunnel syndrome, right upper limb: Secondary | ICD-10-CM | POA: Insufficient documentation

## 2021-10-25 DIAGNOSIS — Z79899 Other long term (current) drug therapy: Secondary | ICD-10-CM

## 2021-10-25 DIAGNOSIS — Z87891 Personal history of nicotine dependence: Secondary | ICD-10-CM | POA: Insufficient documentation

## 2021-10-25 HISTORY — DX: Other specified postprocedural states: Z98.890

## 2021-10-25 HISTORY — PX: CARPAL TUNNEL RELEASE: SHX101

## 2021-10-25 HISTORY — DX: Depression, unspecified: F32.A

## 2021-10-25 HISTORY — DX: Hyperlipidemia, unspecified: E78.5

## 2021-10-25 HISTORY — DX: Nausea with vomiting, unspecified: R11.2

## 2021-10-25 HISTORY — DX: Anxiety disorder, unspecified: F41.9

## 2021-10-25 SURGERY — CARPAL TUNNEL RELEASE
Anesthesia: Monitor Anesthesia Care | Site: Hand | Laterality: Right

## 2021-10-25 MED ORDER — BUPIVACAINE HCL (PF) 0.25 % IJ SOLN
INTRAMUSCULAR | Status: AC
  Filled 2021-10-25: qty 30

## 2021-10-25 MED ORDER — CEFAZOLIN SODIUM-DEXTROSE 2-4 GM/100ML-% IV SOLN
2.0000 g | INTRAVENOUS | Status: AC
  Administered 2021-10-25: 3 g via INTRAVENOUS

## 2021-10-25 MED ORDER — FENTANYL CITRATE (PF) 100 MCG/2ML IJ SOLN: 25.0000 ug | INTRAMUSCULAR | Status: DC | PRN

## 2021-10-25 MED ORDER — LACTATED RINGERS IV SOLN: INTRAVENOUS | Status: DC

## 2021-10-25 MED ORDER — MIDAZOLAM HCL 5 MG/5ML IJ SOLN
INTRAMUSCULAR | Status: DC | PRN
  Administered 2021-10-25 (×2): 1 mg via INTRAVENOUS

## 2021-10-25 MED ORDER — CEFAZOLIN SODIUM-DEXTROSE 2-4 GM/100ML-% IV SOLN
INTRAVENOUS | Status: AC
Start: 2021-10-25 — End: ?
  Filled 2021-10-25: qty 100

## 2021-10-25 MED ORDER — BUPIVACAINE HCL (PF) 0.25 % IJ SOLN
INTRAMUSCULAR | Status: DC | PRN
  Administered 2021-10-25: 5 mL

## 2021-10-25 MED ORDER — ONDANSETRON HCL 4 MG/2ML IJ SOLN
INTRAMUSCULAR | Status: AC
  Filled 2021-10-25: qty 2

## 2021-10-25 MED ORDER — LIDOCAINE HCL (PF) 1 % IJ SOLN
INTRAMUSCULAR | Status: AC
  Filled 2021-10-25: qty 30

## 2021-10-25 MED ORDER — ONDANSETRON HCL 4 MG/2ML IJ SOLN
INTRAMUSCULAR | Status: DC | PRN
Start: 2021-10-25 — End: 2021-10-25
  Administered 2021-10-25: 4 mg via INTRAVENOUS

## 2021-10-25 MED ORDER — PROPOFOL 10 MG/ML IV BOLUS
INTRAVENOUS | Status: DC | PRN
Start: 2021-10-25 — End: 2021-10-25
  Administered 2021-10-25 (×3): 20 mg via INTRAVENOUS

## 2021-10-25 MED ORDER — FENTANYL CITRATE (PF) 100 MCG/2ML IJ SOLN
INTRAMUSCULAR | Status: DC | PRN
  Administered 2021-10-25 (×2): 50 ug via INTRAVENOUS

## 2021-10-25 MED ORDER — CEFAZOLIN SODIUM-DEXTROSE 1-4 GM/50ML-% IV SOLN
INTRAVENOUS | Status: AC
  Filled 2021-10-25: qty 50

## 2021-10-25 MED ORDER — SODIUM BICARBONATE 4.2 % IV SOLN
INTRAVENOUS | Status: DC | PRN
  Administered 2021-10-25: 10 mL via INTRAMUSCULAR

## 2021-10-25 MED ORDER — SODIUM BICARBONATE 4.2 % IV SOLN
INTRAVENOUS | Status: AC
  Filled 2021-10-25: qty 10

## 2021-10-25 MED ORDER — LIDOCAINE HCL (PF) 1 % IJ SOLN
INTRAMUSCULAR | Status: DC | PRN
Start: 2021-10-25 — End: 2021-10-25
  Administered 2021-10-25: 5 mL

## 2021-10-25 MED ORDER — HYDROCODONE-ACETAMINOPHEN 7.5-325 MG PO TABS: 1.0000 | ORAL_TABLET | Freq: Four times a day (QID) | ORAL | 0 refills | Status: DC | PRN

## 2021-10-25 MED ORDER — FENTANYL CITRATE (PF) 100 MCG/2ML IJ SOLN
INTRAMUSCULAR | Status: AC
  Filled 2021-10-25: qty 2

## 2021-10-25 MED ORDER — MIDAZOLAM HCL 2 MG/2ML IJ SOLN
INTRAMUSCULAR | Status: AC
  Filled 2021-10-25: qty 2

## 2021-10-25 SURGICAL SUPPLY — 39 items
BLADE SURG 15 STRL LF DISP TIS (BLADE) ×2 IMPLANT
BLADE SURG 15 STRL SS (BLADE) ×4
BNDG CMPR 9X4 STRL LF SNTH (GAUZE/BANDAGES/DRESSINGS)
BNDG ELASTIC 3X5.8 VLCR STR LF (GAUZE/BANDAGES/DRESSINGS) ×2 IMPLANT
BNDG ESMARK 4X9 LF (GAUZE/BANDAGES/DRESSINGS) IMPLANT
CORD BIPOLAR FORCEPS 12FT (ELECTRODE) ×2 IMPLANT
COVER BACK TABLE 60X90IN (DRAPES) ×2 IMPLANT
COVER MAYO STAND STRL (DRAPES) ×2 IMPLANT
CUFF TOURN SGL QUICK 18X4 (TOURNIQUET CUFF) IMPLANT
CUFF TOURN SGL QUICK 24 (TOURNIQUET CUFF)
CUFF TRNQT CYL 24X4X16.5-23 (TOURNIQUET CUFF) IMPLANT
DRAPE EXTREMITY T 121X128X90 (DISPOSABLE) ×2 IMPLANT
DRAPE SURG 17X23 STRL (DRAPES) ×2 IMPLANT
DURAPREP 26ML APPLICATOR (WOUND CARE) ×2 IMPLANT
GAUZE SPONGE 4X4 12PLY STRL (GAUZE/BANDAGES/DRESSINGS) ×2 IMPLANT
GAUZE XEROFORM 1X8 LF (GAUZE/BANDAGES/DRESSINGS) ×2 IMPLANT
GLOVE BIO SURGEON STRL SZ7.5 (GLOVE) ×4 IMPLANT
GLOVE BIOGEL PI IND STRL 8 (GLOVE) ×2 IMPLANT
GLOVE BIOGEL PI INDICATOR 8 (GLOVE) ×2
GOWN STRL REUS W/ TWL LRG LVL3 (GOWN DISPOSABLE) ×1 IMPLANT
GOWN STRL REUS W/TWL LRG LVL3 (GOWN DISPOSABLE) ×2
GOWN STRL REUS W/TWL XL LVL3 (GOWN DISPOSABLE) ×4 IMPLANT
KNIFE CARPAL TUNNEL (BLADE) ×1 IMPLANT
LOOP VESSEL MAXI BLUE (MISCELLANEOUS) IMPLANT
NDL HYPO 25X1 1.5 SAFETY (NEEDLE) IMPLANT
NEEDLE HYPO 25X1 1.5 SAFETY (NEEDLE) IMPLANT
NS IRRIG 1000ML POUR BTL (IV SOLUTION) ×2 IMPLANT
PACK BASIN DAY SURGERY FS (CUSTOM PROCEDURE TRAY) ×2 IMPLANT
PAD CAST 3X4 CTTN HI CHSV (CAST SUPPLIES) ×1 IMPLANT
PADDING CAST ABS 4INX4YD NS (CAST SUPPLIES) ×1
PADDING CAST ABS COTTON 4X4 ST (CAST SUPPLIES) ×1 IMPLANT
PADDING CAST COTTON 3X4 STRL (CAST SUPPLIES) ×2
SLEEVE SCD COMPRESS KNEE MED (STOCKING) IMPLANT
STOCKINETTE 4X48 STRL (DRAPES) ×2 IMPLANT
SUT ETHILON 3 0 PS 1 (SUTURE) ×2 IMPLANT
SYR BULB EAR ULCER 3OZ GRN STR (SYRINGE) ×2 IMPLANT
SYR CONTROL 10ML LL (SYRINGE) IMPLANT
TOWEL GREEN STERILE FF (TOWEL DISPOSABLE) ×2 IMPLANT
UNDERPAD 30X36 HEAVY ABSORB (UNDERPADS AND DIAPERS) ×2 IMPLANT

## 2021-10-25 NOTE — Brief Op Note (Signed)
10/25/2021 ? ?8:56 AM ? ?PATIENT:  [name]  56 y.o. female ? ?PRE-OPERATIVE DIAGNOSIS:  right carpal tunnel syndrome ? ?POST-OPERATIVE DIAGNOSIS:  * No post-op diagnosis entered * ? ?PROCEDURE:  Procedure(s): ?RIGHT CARPAL TUNNEL RELEASE (Right) ? ?SURGEON:  Surgeon(s) and Role: ?   Mcarthur Rossetti, MD - Primary ? ?ANESTHESIA:   local and IV sedation ? ?COUNTS:  YES ? ?TOURNIQUET:   ?Total Tourniquet Time Documented: ?Forearm (Right) - 10 minutes ?Total: Forearm (Right) - 10 minutes ? ? ?DICTATION: .Other Dictation: Dictation Number [id number] ? ?PLAN OF CARE: Discharge to home after PACU ? ?PATIENT DISPOSITION:  PACU - hemodynamically stable. ?  ?Delay start of Pharmacological VTE agent (>24hrs) due to surgical blood loss or risk of bleeding: no ? ?

## 2021-10-25 NOTE — Op Note (Signed)
NAME: [name], Denise A. ?MEDICAL RECORD NO: [id number] ?ACCOUNT NO: [id number] ?DATE OF BIRTH: 04/11/1966 ?FACILITY: [facility] ?LOCATION: [facility] ?PHYSICIAN: Lind Guest. Ninfa Linden, MD ? ?Operative Report  ? ?DATE OF PROCEDURE: 10/25/2021 ? ?PREOPERATIVE DIAGNOSIS:  Moderate to severe right carpal tunnel syndrome. ? ?POSTOPERATIVE DIAGNOSIS:  Moderate to severe right carpal tunnel syndrome. ? ?PROCEDURE:  Right open carpal tunnel release. ? ?SURGEON:  Lind Guest. Ninfa Linden, MD ? ?ANESTHESIA: ?1.  Mask ventilation, IV sedation. ?2.  Local with mixture of plain lidocaine and plain Marcaine and sodium bicarbonate. ? ?TOURNIQUET TIME:  8 minutes. ? ?ANTIBIOTICS:  3 g IV Ancef. ? ?BLOOD LOSS:  Minimal. ? ?COMPLICATIONS:  None. ? ?INDICATIONS:  The patient is a 56 year old morbidly obese female with well documented moderate to severe carpal tunnel syndrome on final clinical exam as well as nerve conduction studies.  At this point, we recommended open carpal tunnel release.  Given  ?her symptoms, she does wish to proceed with this.  We had a long and thorough discussion about the anatomy of the transverse carpal ligament in the hand.  We talked about the risks and benefits of surgery, what to expect intraoperative and postoperative. ? ?DESCRIPTION OF PROCEDURE:  After informed consent was obtained, appropriate right hand was marked.  She was brought to the operating room and placed supine on the operating table, with the right arm on arm table.  A forearm tourniquet was placed and her  ?right hand was prepped and draped with DuraPrep and sterile drapes.  A timeout was called.  She was identified as correct patient, correct right hand. Mask ventilation, IV sedation was given and the an Esmarch was wrapped out around the hand and the  ?tourniquet was inflated to 250 mm of pressure.  We then anesthetized the area over the transverse carpal ligament where we were making our incision with a mixture of lidocaine, Marcaine and  sodium bicarbonate.  I then made an incision over the transverse ? carpal ligament and carried this proximally and distally in the palm.  We dissected down the distal edge of transverse carpal ligament and slowly and meticulously divided it from distal to proximal, while protecting the median nerve.  I then explored  ?the median nerve and ____ motor branch to be intact.  We then irrigated the soft tissue with normal saline solution.  We reapproximated the skin with interrupted 3-0 nylon suture.  Xeroform well-padded sterile dressing was applied.  The tourniquet was  ?let down.  The fingers pinked nicely.  She was able to move her fingers and thumb afterwards.  She was taken to recovery room in stable condition with all final counts being correct.  No complications noted. ? ? ?NIK ?D: 10/25/2021 8:55:29 am T: 10/25/2021 10:08:00 am  ?JOB: [id number]/ [id number]  ?

## 2021-10-25 NOTE — Discharge Instructions (Addendum)
You may use your right hand as comfort allows. ?Do expect swelling -ice and elevation throughout the next few days as needed. ?Do expect some right hand pain and continued numbness. ?Keep your current dressings clean and dry. ?Keep your dressings on for the next 6 days. ?In 6 days you can remove your dressing and get your incision wet in the shower daily. ?After each shower do protect your incision with large Band-Aids over the incision daily. ? ? ? ?Post Anesthesia Home Care Instructions ? ?Activity: ?Get plenty of rest for the remainder of the day. A responsible individual must stay with you for 24 hours following the procedure.  ?For the next 24 hours, DO NOT: ?-Drive a car ?-Paediatric nurse ?-Drink alcoholic beverages ?-Take any medication unless instructed by your physician ?-Make any legal decisions or sign important papers. ? ?Meals: ?Start with liquid foods such as gelatin or soup. Progress to regular foods as tolerated. Avoid greasy, spicy, heavy foods. If nausea and/or vomiting occur, drink only clear liquids until the nausea and/or vomiting subsides. Call your physician if vomiting continues. ? ?Special Instructions/Symptoms: ?Your throat may feel dry or sore from the anesthesia or the breathing tube placed in your throat during surgery. If this causes discomfort, gargle with warm salt water. The discomfort should disappear within 24 hours. ? ?If you had a scopolamine patch placed behind your ear for the management of post- operative nausea and/or vomiting: ? ?1. The medication in the patch is effective for 72 hours, after which it should be removed.  Wrap patch in a tissue and discard in the trash. Wash hands thoroughly with soap and water. ?2. You may remove the patch earlier than 72 hours if you experience unpleasant side effects which may include dry mouth, dizziness or visual disturbances. ?3. Avoid touching the patch. Wash your hands with soap and water after contact with the patch. ?    ?

## 2021-10-25 NOTE — Transfer of Care (Signed)
Immediate Anesthesia Transfer of Care Note ? ?Patient: [name] ? ?Procedure(s) Performed: RIGHT CARPAL TUNNEL RELEASE (Right) ? ?Patient Location: PACU ? ?Anesthesia Type:MAC ? ?Level of Consciousness: awake, alert  and oriented ? ?Airway & Oxygen Therapy: Patient Spontanous Breathing and Patient connected to face mask oxygen ? ?Post-op Assessment: Report given to RN and Post -op Vital signs reviewed and stable ? ?Post vital signs: Reviewed and stable ? ?Last Vitals:  ?Vitals Value Taken Time  ?BP 110/69 10/25/21 0900  ?Temp 36.6 ?C 10/25/21 0900  ?Pulse 76 10/25/21 0903  ?Resp 19 10/25/21 0903  ?SpO2 94 % 10/25/21 0903  ?Vitals shown include unvalidated device data. ? ?Last Pain:  ?Vitals:  ? 10/25/21 0732  ?TempSrc: Oral  ?PainSc: 0-No pain  ?   ? ?Patients Stated Pain Goal: 2 (10/25/21 0732) ? ?Complications: No notable events documented. ?

## 2021-10-25 NOTE — Anesthesia Preprocedure Evaluation (Signed)
Anesthesia Evaluation  ?Patient identified by MRN, date of birth, ID band ?Patient awake ? ? ? ?History of Anesthesia Complications ?(+) PONV and history of anesthetic complications ? ?Airway ?Mallampati: II ? ?TM Distance: >3 FB ? ? ? ? Dental ?  ?Pulmonary ?sleep apnea , former smoker,  ?  ?breath sounds clear to auscultation ? ? ? ? ? ? Cardiovascular ?hypertension,  ?Rhythm:Regular Rate:Normal ? ? ?  ?Neuro/Psych ?PSYCHIATRIC DISORDERS  Neuromuscular disease   ? GI/Hepatic ?negative GI ROS, Neg liver ROS,   ?Endo/Other  ?diabetes ? Renal/GU ?negative Renal ROS  ? ?  ?Musculoskeletal ? ? Abdominal ?  ?Peds ? Hematology ?  ?Anesthesia Other Findings ? ? Reproductive/Obstetrics ? ?  ? ? ? ? ? ? ? ? ? ? ? ? ? ?  ?  ? ? ? ? ? ? ? ? ?Anesthesia Physical ?Anesthesia Plan ? ?ASA: 3 ? ?Anesthesia Plan: MAC  ? ?Post-op Pain Management:   ? ?Induction: Intravenous ? ?PONV Risk Score and Plan: 3 and Ondansetron, Dexamethasone and Midazolam ? ?Airway Management Planned: Simple Face Mask and Nasal Cannula ? ?Additional Equipment:  ? ?Intra-op Plan:  ? ?Post-operative Plan:  ? ?Informed Consent: I have reviewed the patients History and Physical, chart, labs and discussed the procedure including the risks, benefits and alternatives for the proposed anesthesia with the patient or authorized representative who has indicated his/her understanding and acceptance.  ? ? ? ? ? ?Plan Discussed with: CRNA and Anesthesiologist ? ?Anesthesia Plan Comments:   ? ? ? ? ? ? ?Anesthesia Quick Evaluation ? ?

## 2021-10-25 NOTE — Interval H&P Note (Signed)
History and Physical Interval Note:  The patient understands fully that she is here today for a right open carpal tunnel release to treat her severe right carpal tunnel syndrome.  There has been no acute or interval change in her medical status.  Please see H&P.  The risks and benefits of surgery have been discussed and informed consent is obtained.  The right hand has been marked. ? ?10/25/2021 ?8:24 AM ? ?[name]  has presented today for surgery, with the diagnosis of right carpal tunnel syndrome.  The various methods of treatment have been discussed with the patient and family. After consideration of risks, benefits and other options for treatment, the patient has consented to  Procedure(s): ?RIGHT CARPAL TUNNEL RELEASE (Right) as a surgical intervention.  The patient's history has been reviewed, patient examined, no change in status, stable for surgery.  I have reviewed the patient's chart and labs.  Questions were answered to the patient's satisfaction.   ? ? ?Mcarthur Rossetti ? ? ?

## 2021-10-25 NOTE — Anesthesia Postprocedure Evaluation (Signed)
Anesthesia Post Note ? ?Patient: [name] ? ?Procedure(s) Performed: RIGHT CARPAL TUNNEL RELEASE (Right: Hand) ? ?  ? ?Patient location during evaluation: PACU ?Anesthesia Type: MAC ?Level of consciousness: awake ?Pain management: pain level controlled ?Respiratory status: spontaneous breathing ?Cardiovascular status: stable ?Postop Assessment: no apparent nausea or vomiting ?Anesthetic complications: no ? ? ?No notable events documented. ? ?Last Vitals:  ?Vitals:  ? 10/25/21 0921 10/25/21 0930  ?BP:    ?Pulse: 73 67  ?Resp: 16 (!) 0  ?Temp:  36.6 ?C  ?SpO2: 100% 97%  ?  ?Last Pain:  ?Vitals:  ? 10/25/21 0930  ?TempSrc:   ?PainSc: 0-No pain  ? ? ?  ?  ?  ?  ?  ?  ? ?Tyreck Bell ? ? ? ? ?

## 2021-10-26 ENCOUNTER — Encounter (HOSPITAL_BASED_OUTPATIENT_CLINIC_OR_DEPARTMENT_OTHER): Payer: Self-pay | Admitting: Orthopaedic Surgery

## 2021-10-31 ENCOUNTER — Encounter: Payer: 59 | Admitting: Internal Medicine

## 2021-10-31 ENCOUNTER — Telehealth: Payer: Self-pay | Admitting: *Deleted

## 2021-10-31 NOTE — Telephone Encounter (Signed)
Patient is requesting lab results(ready to view in epic), please advise. ?

## 2021-11-01 ENCOUNTER — Other Ambulatory Visit: Payer: Self-pay | Admitting: Podiatry

## 2021-11-01 MED ORDER — CICLOPIROX 8 % EX SOLN: Freq: Every day | CUTANEOUS | 0 refills | Status: DC

## 2021-11-01 NOTE — Telephone Encounter (Signed)
Called patient discussed results and prescription for penlac provided. Thanks

## 2021-11-02 ENCOUNTER — Telehealth: Payer: Self-pay | Admitting: Orthopaedic Surgery

## 2021-11-02 ENCOUNTER — Other Ambulatory Visit: Payer: Self-pay | Admitting: Orthopaedic Surgery

## 2021-11-02 LAB — HM DIABETES EYE EXAM

## 2021-11-02 MED ORDER — HYDROCODONE-ACETAMINOPHEN 7.5-325 MG PO TABS: 1.0000 | ORAL_TABLET | Freq: Three times a day (TID) | ORAL | 0 refills | Status: DC | PRN

## 2021-11-02 NOTE — Telephone Encounter (Signed)
Patient is in need of a pain medication refill

## 2021-11-06 ENCOUNTER — Telehealth: Payer: Self-pay

## 2021-11-06 NOTE — Telephone Encounter (Signed)
Appt 11/28/2021 in Clinics.  No [facility].

## 2021-11-06 NOTE — Telephone Encounter (Signed)
Suboxone [facility] [phone] - [place], [place] AT [address] Phone:  [phone]  Fax:  [phone]-

## 2021-11-08 ENCOUNTER — Ambulatory Visit (INDEPENDENT_AMBULATORY_CARE_PROVIDER_SITE_OTHER): Payer: 59 | Admitting: Orthopaedic Surgery

## 2021-11-08 ENCOUNTER — Encounter: Payer: Self-pay | Admitting: Orthopaedic Surgery

## 2021-11-08 DIAGNOSIS — Z9889 Other specified postprocedural states: Secondary | ICD-10-CM

## 2021-11-08 NOTE — Progress Notes (Signed)
The patient is 2 weeks status post a right open carpal tunnel release.  She is doing well overall and has no significant complaints.  The sutures have been removed from her hand incision.  It looks pretty good overall.  I did place some Steri-Strips.  I instructed her to use her hand as comfort allows but no heavy gripping type of activities.  I would like to see her back in 4 weeks to see how she is doing overall.  If there are any issues before then she knows to let us know.

## 2021-11-13 MED ORDER — BUPRENORPHINE HCL-NALOXONE HCL 8-2 MG SL SUBL: 2.0000 | SUBLINGUAL_TABLET | Freq: Every day | SUBLINGUAL | 2 refills | Status: DC

## 2021-11-13 NOTE — Telephone Encounter (Signed)
Suboxone [facility] [phone] - [place], [place] AT [address] Phone:  [phone]  Fax:  [phone]

## 2021-11-14 ENCOUNTER — Telehealth: Payer: Self-pay

## 2021-11-14 MED ORDER — BUPRENORPHINE HCL-NALOXONE HCL 8-2 MG SL FILM: 1.0000 | ORAL_FILM | Freq: Two times a day (BID) | SUBLINGUAL | 2 refills | Status: DC

## 2021-11-14 NOTE — Telephone Encounter (Signed)
RTC to patient states  that the Suboxone pills were sent in and that she takes the film.  Needs new prescription sent in for the film.

## 2021-11-14 NOTE — Telephone Encounter (Signed)
Pt states she need Suboxone to be film and not tablet. Please call pt back.

## 2021-11-20 ENCOUNTER — Encounter: Payer: Self-pay | Admitting: Dietician

## 2021-11-27 NOTE — Progress Notes (Unsigned)
[facility]: Clinic Note  Subjective:  History of Present Illness: [name] is a 56 y.o. year old female who presents for 24-monthfollow-up of her chronic medical conditions.  Since our last visit, she saw Ortho for her right shoulder pain. This is doing much better. She's no longer taking Naproxen.  She had surgery for carpal tunnel syndrome. The surgeon prescribed her Norco for acute post-op pain, she's taken just a few of these. Her pharmacist instructed her to stop Suboxone while on Norco, so she did this. We talked today about discussing any changes to Suboxone with me directly in the future. She'll stop Norco now, last dose 2 days ago, and resume Suboxone now.  She saw Podiatry for onychomycosis, has follow-up with them later today.  For her HTN, last time we switched Atenolol to Losartan '25mg'$  daily. She's been tolerating this well, no side effects.  For her T2DM, she's not on meds for A1C 6.1. [facility] She has small cataracts, but no evidence of diabetic retinopathy.   [facility], and [facility]. DEvorn Gongdid her Roux-en-Y last year, but he's with [facility], so no longer takes her insurance.  Her one concern today is hot flashes at night, have been occurring almost nightly for 6 months. She had a partial hysterectomy years ago, so had not been getting menstrual periods since that. No associated weight gain, vaginal dryness/irritation, or mood symptoms. The hot flashes at night only bother her 1/10.    Please refer to Assessment and Plan below for full details in Problem-Based Charting.   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 Pertinent updates today include: None  Medications:  Current Outpatient Medications:    atorvastatin (LIPITOR) 20 MG tablet, Take 1 tablet (20 mg total) by mouth daily., Disp: 90 tablet, Rfl: 3   Buprenorphine HCl-Naloxone HCl (SUBOXONE) 8-2 MG FILM, Place 1 Film under the tongue in the morning and at bedtime., Disp: 60 each, Rfl: 2   ciclopirox (PENLAC) 8 % solution, Apply topically at bedtime., Disp: 6.6 mL, Rfl: 3   ciclopirox (PENLAC) 8 % solution, Apply topically at bedtime. Apply over nail and surrounding skin. Apply daily over previous coat. After seven (7) days, may remove with alcohol and continue cycle., Disp: 6.6 mL, Rfl: 0   cyclobenzaprine (FLEXERIL) 5 MG tablet, Take 1 tablet (5 mg total) by mouth 3 (three) times daily as needed for muscle spasms., Disp: 30 tablet, Rfl: 3   hydrochlorothiazide (HYDRODIURIL) 25 MG tablet, Take 1 tablet (25 mg total) by mouth daily., Disp: 90 tablet, Rfl: 3   ketoconazole (NIZORAL) 2 % shampoo, Apply topically  2 (two) times a week., Disp: 120 mL, Rfl: 3   latanoprost (XALATAN) 0.005 % ophthalmic solution, Administer 1 drop to both eyes nightly., Disp: 2.5 mL, Rfl: 3   losartan (COZAAR) 25 MG tablet, Take 1 tablet (25 mg total) by mouth daily., Disp: 30 tablet, Rfl: 11   Misc. Devices MISC, Salicylic acid 2.5% and Undecylenic acid.  Dispense 60 mL.   Please apply to affected nails daily as directed, Disp: , Rfl:    omeprazole (PRILOSEC) 40 MG capsule, Take 1 capsule (40 mg total) by mouth daily., Disp: 90 capsule, Rfl: 3   Prenatal Vit-DSS-Fe Fum-FA (PRENATAL 19) tablet, Take 1 tablet by mouth daily., Disp: , Rfl:    triamcinolone cream (KENALOG) 0.5 %, APPLY TOPICALLY 3 TIMES DAILY NOT FOR CHRONIC DAILY USE, Disp: 30 g, Rfl: 3   ursodiol (ACTIGALL) 300 MG capsule, Take one tab twice a day with food BEGINNING 14 DAYS AFTER SURGERY, Disp: 180 capsule, Rfl: 3   Allergies: No Known Allergies  Review of Systems: Review of Systems  Constitutional:  Positive for diaphoresis. Negative for weight loss.  Respiratory:  Negative for shortness of breath.   Cardiovascular:  Negative for chest pain and leg swelling.     Objective:   Vitals: Vitals:   11/28/21 0927  BP: 133/77  Pulse: 87  Temp: 97.7 F (36.5 C)  SpO2: 98%    Physical Exam: Physical Exam Constitutional:      Appearance: Normal appearance.  Cardiovascular:     Rate and Rhythm: Normal rate and regular rhythm.     Pulses: Normal pulses.     Heart sounds: No murmur heard. Pulmonary:     Effort: Pulmonary effort is normal.     Breath sounds: Normal breath sounds. No wheezing or rales.  Musculoskeletal:     Comments: +scar over R hand s/p carpal tunnel surgery   Neurological:     Mental Status: She is alert.  Psychiatric:        Mood and Affect: Mood normal.        Behavior: Behavior normal.      Data: Labs, imaging, and micro were reviewed in Epic. Refer to Assessment and Plan below for full details in Problem-Based Charting.  Assessment & Plan:  Essential hypertension - continue HCTZ '25mg'$  daily & Losartan 25 mg daily - BMP today   History of Roux-en-Y gastric bypass - New referral placed to [facility], as her old surgeon Dr. Evorn Gong doesn't take her new insurance   Opioid use disorder in remission - UDS next time - Continue Suboxone 8-2 BID -  Stop the very short course of Norco from outside Ortho, and resume Suboxone  Heat intolerance - Will check BMP & TSH today - Symptoms seem very mild, so no treatment needed at this time. Continue to monitor   Vision changes - Patient's recent diabetic retinopathy screen was normal, but she needs new glasses. Placed new referral for general ophtho, since retina specialist recommended this     Patient will follow up in 6 months   Lottie Mussel, MD

## 2021-11-28 ENCOUNTER — Encounter: Payer: Self-pay | Admitting: Podiatry

## 2021-11-28 ENCOUNTER — Ambulatory Visit (INDEPENDENT_AMBULATORY_CARE_PROVIDER_SITE_OTHER): Payer: 59 | Admitting: Internal Medicine

## 2021-11-28 ENCOUNTER — Encounter: Payer: Self-pay | Admitting: Internal Medicine

## 2021-11-28 ENCOUNTER — Ambulatory Visit (INDEPENDENT_AMBULATORY_CARE_PROVIDER_SITE_OTHER): Payer: 59 | Admitting: Podiatry

## 2021-11-28 VITALS — BP 133/77 | HR 87 | Temp 97.7°F | Ht 68.0 in | Wt 277.4 lb

## 2021-11-28 DIAGNOSIS — F1191 Opioid use, unspecified, in remission: Secondary | ICD-10-CM

## 2021-11-28 DIAGNOSIS — H539 Unspecified visual disturbance: Secondary | ICD-10-CM | POA: Diagnosis not present

## 2021-11-28 DIAGNOSIS — Z9884 Bariatric surgery status: Secondary | ICD-10-CM

## 2021-11-28 DIAGNOSIS — B351 Tinea unguium: Secondary | ICD-10-CM | POA: Diagnosis not present

## 2021-11-28 DIAGNOSIS — R6889 Other general symptoms and signs: Secondary | ICD-10-CM | POA: Insufficient documentation

## 2021-11-28 DIAGNOSIS — I10 Essential (primary) hypertension: Secondary | ICD-10-CM | POA: Diagnosis not present

## 2021-11-28 DIAGNOSIS — Z87891 Personal history of nicotine dependence: Secondary | ICD-10-CM

## 2021-11-28 NOTE — Assessment & Plan Note (Signed)
-   Will check BMP & TSH today - Symptoms seem very mild, so no treatment needed at this time. Continue to monitor

## 2021-11-28 NOTE — Patient Instructions (Signed)
It was a pleasure seeing you in clinic today.  We did not change any of your medicines.  We're checking some lab work today - I will call you with these results.   Please stop taking the Norco from your surgeon, and restart your Suboxone today.   I'll see you back in clinic in 6 months.  Please call us if you need anything else in the meantime.

## 2021-11-28 NOTE — Assessment & Plan Note (Signed)
-   UDS next time - Continue Suboxone 8-2 BID - Stop the very short course of Norco from outside Ortho, and resume Suboxone

## 2021-11-28 NOTE — Assessment & Plan Note (Signed)
-   New referral placed to [facility], as her old surgeon Dr. Evorn Gong doesn't take her new insurance

## 2021-11-28 NOTE — Assessment & Plan Note (Signed)
-   Patient's recent diabetic retinopathy screen was normal, but she needs new glasses. Placed new referral for general ophtho, since retina specialist recommended this

## 2021-11-28 NOTE — Progress Notes (Signed)
  Subjective:  Patient ID: [name], female    DOB: 1965/08/01,   MRN: [id number]  Chief Complaint  Patient presents with   Nail Problem    medication is making nails apart     56 y.o. female presents for follow-up of toenail fungus. Relates the penlac was causing a reaction on her toes and stopped using. Relates she did get a prescription from a podiatrist at [facility] and has been using on right foot. States that side is looking better.  . Denies n/v/f/c.   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HCC)    Chronic pain    Depression    Diabetes ([place])    Hyperlipidemia    Hypertension    PONV (postoperative nausea and vomiting)    Sleep apnea     Objective:  Physical Exam: Vascular: DP/PT pulses 2/4 bilateral. CFT <3 seconds. Normal hair growth on digits. No edema.  Skin. No lacerations or abrasions bilateral feet. Nails 1-5 are thickened discolored and elongated with subungual debris. Right side the proximal borders are looking more clear.  Discoloration noted around nails beds 1-5 of the skin bilateral.  Musculoskeletal: MMT 5/5 bilateral lower extremities in DF, PF, Inversion and Eversion. Deceased ROM in DF of ankle joint.  Neurological: Sensation intact to light touch.   Assessment:   1. Onychomycosis      Plan:  Patient was evaluated and treated and all questions answered. -Examined patient -Discussed treatment options for painful dystrophic nails  -Discontinue penlac.  -continue with compounded medication. Will send info on it when she gets the name  -Return as needed for any changes.    Lorenda Peck, DPM

## 2021-11-28 NOTE — Assessment & Plan Note (Signed)
-   continue HCTZ '25mg'$  daily & Losartan 25 mg daily - BMP today

## 2021-11-29 LAB — BMP8+ANION GAP
Anion Gap: 13 mmol/L (ref 10.0–18.0)
BUN/Creatinine Ratio: 12 (ref 9–23)
BUN: 8 mg/dL (ref 6–24)
CO2: 25 mmol/L (ref 20–29)
Calcium: 9.4 mg/dL (ref 8.7–10.2)
Chloride: 103 mmol/L (ref 96–106)
Creatinine, Ser: 0.66 mg/dL (ref 0.57–1.00)
Glucose: 132 mg/dL — ABNORMAL HIGH (ref 70–99)
Potassium: 4.4 mmol/L (ref 3.5–5.2)
Sodium: 141 mmol/L (ref 134–144)
eGFR: 104 mL/min/{1.73_m2} (ref >59)

## 2021-11-29 LAB — TSH: TSH: 0.451 u[IU]/mL (ref 0.450–4.500)

## 2021-11-29 NOTE — Progress Notes (Signed)
Called patient to discuss labs - everything looked normal. No action needed

## 2021-12-03 ENCOUNTER — Other Ambulatory Visit: Payer: Self-pay

## 2021-12-04 MED ORDER — LATANOPROST 0.005 % OP SOLN: OPHTHALMIC | 3 refills | Status: DC

## 2021-12-06 ENCOUNTER — Encounter: Payer: Self-pay | Admitting: Orthopaedic Surgery

## 2021-12-06 ENCOUNTER — Ambulatory Visit (INDEPENDENT_AMBULATORY_CARE_PROVIDER_SITE_OTHER): Payer: 59 | Admitting: Orthopaedic Surgery

## 2021-12-06 DIAGNOSIS — Z9889 Other specified postprocedural states: Secondary | ICD-10-CM

## 2021-12-06 NOTE — Progress Notes (Signed)
The patient is now 6 weeks status post a right open carpal tunnel release.  She says she is doing well overall.  She is not significant carpal tunnel syndrome of the left side and at some point we will consider a left open carpal tunnel release.  Right now it is appropriate that she waits as she is recovering from her right hand.  Her right hand incision looks great at the palm.  There is no swelling and no redness.  There is no muscle atrophy.  She has improving grip and pinch strength.  Her left hand shows significant numbness and tingling and positive Phalen's and Tinel's exam.  She will continue to wear the wrist splint on her left side.  She understands that this still can take 6 months or more to recover from carpal tunnel surgery but she is doing great on her right side.  When he gets to the point that she would like to have this scheduled for an open carpal tunnel release on the left side she will call us.

## 2021-12-10 ENCOUNTER — Ambulatory Visit: Payer: 59 | Admitting: Neurology

## 2021-12-13 ENCOUNTER — Other Ambulatory Visit: Payer: Self-pay

## 2021-12-14 MED ORDER — BUPRENORPHINE HCL-NALOXONE HCL 8-2 MG SL FILM: 1.0000 | ORAL_FILM | Freq: Two times a day (BID) | SUBLINGUAL | 2 refills | Status: DC

## 2021-12-20 ENCOUNTER — Ambulatory Visit: Payer: 59 | Admitting: Neurology

## 2021-12-20 ENCOUNTER — Encounter: Payer: Self-pay | Admitting: Neurology

## 2021-12-20 VITALS — BP 135/71 | HR 80 | Ht 68.0 in | Wt 280.0 lb

## 2021-12-20 DIAGNOSIS — Z9989 Dependence on other enabling machines and devices: Secondary | ICD-10-CM | POA: Diagnosis not present

## 2021-12-20 DIAGNOSIS — G4733 Obstructive sleep apnea (adult) (pediatric): Secondary | ICD-10-CM

## 2021-12-20 NOTE — Progress Notes (Signed)
Subjective:    Patient ID: [name] is a 56 y.o. female.  HPI    Interim history:   [name] is a 56 year old right-handed woman with an underlying medical history of diabetes, hypertension, arthritis, chronic pain, history of aspergillosis, status post right lower lung lobectomy and obesity with BMI of over 40 (with weight loss achieved after gastric bypass surgery in May 2022), who presents for follow-up consultation of her sleep apnea, after interim sleep testing and re-starting autoPAP therapy. The patient is unaccompanied today.  I first met her at the request of her primary care physician on 09/12/2021, at which time she reported a prior diagnosis of obstructive sleep apnea.  She had not been using her current PAP machine.  She was advised to proceed with reevaluation with a sleep study.  She had a baseline sleep study on 09/17/2021 which showed a sleep latency of 94 minutes, REM latency 71 minutes, sleep efficiency 48.7%.  She had 15.5% of REM sleep, 14.6% of slow-wave sleep and 68.4% of stage II sleep.  Total AHI was 10.5/h, rising to 31.3/h during REM sleep, average oxygen saturation 96%, nadir 76% with time below 89% saturation of 30 minutes.  She was advised to restart AutoPap therapy based on her REM related desaturations.  She restarted treatment in April 2023.  Today, 12/20/2021: I reviewed her AutoPap compliance data for the past 3 months, during which time usage is only documented for 12 days out of 90 days, starting at 07/18/2018 if with some interruptions.  Average AHI 1.1/h, 95th percentile of AutoPap pressure at 13.6 cm with a Default range of 4-20, leak acceptable with the 95th percentile at 9.7 L/min.  She reports using her machine since her sleep study.  She has not taken her machine into her current DME company, [employer], she did get supplies from them but not this machine, this machine had been issued to her before, she is not sure exactly when.  She has a ResMed AirSense 10  AutoSet machine.  She uses a fullface mask and benefits from treatment.  The patient's allergies, current medications, family history, past medical history, past social history, past surgical history and problem list were reviewed and updated as appropriate.   Previously:   09/12/21: (She) was previously diagnosed with obstructive sleep apnea and has been on PAP therapy.  I was able to review a sleep study from 07/27/2013, study was interpreted by Dr. Baird Lyons.  Total AHI was 79.3/h, O2 nadir 69%.  This was a split-night sleep study and she was treated with CPAP therapy with an optimal pressure of 14 cm at the time utilizing a large Simplus fullface mask.  She has been followed by [facility] neurology for sleep apnea.  I reviewed an office visit note from 05/01/2021.  She saw Nani Skillern, PA at the time.  She reported ongoing benefit with CPAP therapy at the time. I reviewed your office note from 07/04/2021.  Her Epworth sleepiness score is 12 out of 24, fatigue severity score is 21 out of 63.  She reports that she has not been consistent with CPAP therapy.  Within the past year, there was no data on her CPAP machine SD card. She had a sleep study through [facility] on 12/07/2017 and I was able to review the results in care everywhere.  Study was interpreted by Dr. Sanjuana Letters.  She was noted to have moderate obstructive sleep apnea with an AHI of 15.6/h, severe REM related sleep apnea with a REM  AHI of 58/h, O2 nadir 87%.  Disruption of sleep architecture was noted, she was noted to have hypoventilation with end-tidal CO2 measurements above 50 torr for 86.8% of the night.  She was noted to have cyclobenzaprine on her medication list.  Lives with her daughter and her boyfriend.  He snores loudly which interrupts her sleep that she reports.  She currently does not work.  She has 3 children.  She is not aware of any family history of sleep apnea.  Altogether, she believes she has lost about 50 pounds since her weight  loss surgery but recently had an approximately 14 pound weight gain.  She has nocturia about 3 times per average night.  Bedtime is generally between 9 and 10 PM and rise time around 6:30 AM.  She smokes cigarettes occasionally, not daily.  She drinks alcohol maybe 2 or 3 times per month.  She avoids drinking caffeine, prefers decaf.    Her Past Medical History Is Significant For: 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[place])    Chronic pain    Depression    Diabetes ([place])    Hyperlipidemia    Hypertension    PONV (postoperative nausea and vomiting)    Sleep apnea     Her Past Surgical History Is Significant For: Past Surgical History:  Procedure Laterality Date   ABDOMINAL HYSTERECTOMY  1999   CARPAL TUNNEL RELEASE Right 10/25/2021   Procedure: RIGHT CARPAL TUNNEL RELEASE;  Surgeon: Mcarthur Rossetti, MD;  Location: [place];  Service: Orthopedics;  Laterality: Right;   FRACTURE SURGERY     LUNG LOBECTOMY  2010   right lower lobe   ROUX-EN-Y GASTRIC BYPASS  10/30/2020    Her Family History Is Significant For: Family History  Problem Relation Age of Onset   COPD Mother    Alcohol abuse Father    Pancreatic cancer Father    Diabetes Maternal Grandmother    Hyperlipidemia Maternal Grandmother    Hypertension Maternal Grandmother     Her Social History Is Significant For: Social History   Socioeconomic History   Marital status: Single    Spouse name: Not on file   Number of children: 3   Years of education: Not on file   Highest education level: Not on file  Occupational History   Not on file  Tobacco Use   Smoking status: Former    Types: Cigarettes    Quit date: 07/25/2013    Years since quitting: 8.4   Smokeless tobacco: Never  Vaping Use   Vaping Use: Never used  Substance and Sexual Activity   Alcohol use: Yes   Drug use: No   Sexual activity: Yes    Birth control/protection: Surgical  Other  Topics Concern   Not on file  Social History Narrative   Not on file   Social Determinants of Health   Financial Resource Strain: Not on file  Food Insecurity: Not on file  Transportation Needs: Not on file  Physical Activity: Not on file  Stress: Not on file  Social Connections: Not on file    Her Allergies Are:  No Known Allergies:   Her Current Medications Are:  Outpatient Encounter Medications as of 12/20/2021  Medication Sig   atorvastatin (LIPITOR) 20 MG tablet Take 1 tablet (20 mg total) by mouth daily.   Buprenorphine HCl-Naloxone HCl (SUBOXONE) 8-2 MG FILM Place 1 Film under the tongue in the morning  and at bedtime.   ciclopirox (PENLAC) 8 % solution Apply topically at bedtime.   ciclopirox (PENLAC) 8 % solution Apply topically at bedtime. Apply over nail and surrounding skin. Apply daily over previous coat. After seven (7) days, may remove with alcohol and continue cycle.   cyclobenzaprine (FLEXERIL) 5 MG tablet Take 1 tablet (5 mg total) by mouth 3 (three) times daily as needed for muscle spasms.   hydrochlorothiazide (HYDRODIURIL) 25 MG tablet Take 1 tablet (25 mg total) by mouth daily.   ketoconazole (NIZORAL) 2 % shampoo Apply topically 2 (two) times a week.   latanoprost (XALATAN) 0.005 % ophthalmic solution Administer 1 drop to both eyes nightly.   losartan (COZAAR) 25 MG tablet Take 1 tablet (25 mg total) by mouth daily.   Misc. Devices MISC Salicylic acid 5.3% and Undecylenic acid.  Dispense 60 mL.  Please apply to affected nails daily as directed   omeprazole (PRILOSEC) 40 MG capsule Take 1 capsule (40 mg total) by mouth daily.   Prenatal Vit-DSS-Fe Fum-FA (PRENATAL 19) tablet Take 1 tablet by mouth daily.   triamcinolone cream (KENALOG) 0.5 % APPLY TOPICALLY 3 TIMES DAILY NOT FOR CHRONIC DAILY USE   ursodiol (ACTIGALL) 300 MG capsule Take one tab twice a day with food BEGINNING 14 DAYS AFTER SURGERY   No facility-administered encounter medications on file as of  12/20/2021.  :  Review of Systems:  Out of a complete 14 point review of systems, all are reviewed and negative with the exception of these symptoms as listed below:  Review of Systems  Neurological:        Pt here for f/u for cpap.  She states using machine, when she uses it.  Feels rested when uses it.    Objective:  Neurological Exam  Physical Exam Physical Examination:   Vitals:   12/20/21 0753  BP: 135/71  Pulse: 80    General Examination: The patient is a very pleasant 56 y.o. female in no acute distress. She appears well-developed and well-nourished and well groomed.   HEENT: Normocephalic, atraumatic, pupils are equal, round and reactive to light, tracking well-preserved, airway examination reveals stable findings, tongue protrudes centrally and palate elevates symmetrically.  No carotid bruits.  Chest: Clear to auscultation without wheezing, rhonchi or crackles noted.   Heart: S1+S2+0, regular and normal without murmurs, rubs or gallops noted.    Abdomen: Soft, non-tender and non-distended.   Extremities: There is no pitting edema in the distal lower extremities bilaterally.    Skin: Warm and dry without trophic changes noted.    Musculoskeletal: exam reveals no obvious joint deformities.    Neurologically:  Mental status: The patient is awake, alert and oriented in all 4 spheres. Her immediate and remote memory, attention, language skills and fund of knowledge are appropriate. There is no evidence of aphasia, agnosia, apraxia or anomia. Speech is clear with normal prosody and enunciation. Thought process is linear. Mood is normal and affect is normal.  Cranial nerves II - XII are as described above under HEENT exam.  Motor exam: Normal bulk, strength and tone is noted. There is no obvious tremor.  Fine motor skills and coordination: grossly intact.  Cerebellar testing: No dysmetria or intention tremor.  Sensory exam: intact to light touch in the upper and lower  extremities.     Assessment and Plan:  In summary, [name] is a very pleasant 56 year old female with an underlying medical history of diabetes, hypertension, arthritis, chronic pain, history of  aspergillosis, status post right lower lung lobectomy and severe obesity with BMI of over 40, with weight loss achieved after gastric bypass surgery in May 2022, who presents for follow-up consultation of her obstructive sleep apnea.  She was previously diagnosed with severe obstructive sleep apnea in the past, she has now residual mild sleep apnea with significant REM related component and REM related desaturations.  She was advised to restart AutoPap therapy after her sleep study in April 2023.  She is indicating more compliance with treatment than visible on her compliance data report.  She is advised to take her machine and to her DME company to have it looked at and see if there is additional data missing.  She is on the default AutoPap setting and does not mind pressure range.  She tolerates treatment and endorses benefit from it.  She is advised to be consistent with her usage, average use digit of about 3 hours and 56 minutes for the data available which is 12 out of 90 days.  She is advised to follow-up routinely to see one of our nurse practitioners in 6 months, sooner if needed.  She is advised to make an appointment with her DME provider in the interim.  I answered all her questions today and she was in agreement.

## 2021-12-20 NOTE — Patient Instructions (Addendum)
Please call [employer], your DME (durable medical equipment) company for issues with your machine's ability to track your usage as you indicated you have been using your machine since April.  You may need to make an appointment with them at their office, so they can look at your machine, please make sure you bring everything to the appointment including power cord.  Please continue using your autoPAP regularly. While your insurance requires that you use PAP at least 4 hours each night on 70% of the nights, I recommend, that you not skip any nights and use it throughout the night if you can. Getting used to PAP and staying with the treatment long term does take time and patience and discipline. Untreated obstructive sleep apnea when it is moderate to severe can have an adverse impact on cardiovascular health and raise her risk for heart disease, arrhythmias, hypertension, congestive heart failure, stroke and diabetes. Untreated obstructive sleep apnea causes sleep disruption, nonrestorative sleep, and sleep deprivation. This can have an impact on your day to day functioning and cause daytime sleepiness and impairment of cognitive function, memory loss, mood disturbance, and problems focussing. Using PAP regularly can improve these symptoms.  Please follow up routinely to see one of our nurse practitioners in 6 months, sooner if needed.

## 2022-01-08 ENCOUNTER — Other Ambulatory Visit: Payer: Self-pay

## 2022-01-08 MED ORDER — BUPRENORPHINE HCL-NALOXONE HCL 8-2 MG SL FILM: 1.0000 | ORAL_FILM | Freq: Two times a day (BID) | SUBLINGUAL | 2 refills | Status: DC

## 2022-01-08 NOTE — Telephone Encounter (Signed)
Buprenorphine HCl-Naloxone HCl (SUBOXONE) 8-2 MG FILM, REFILL REQUEST @ [facility] [phone] - [place], [address] AT [place].

## 2022-01-08 NOTE — Telephone Encounter (Signed)
Last appointment 11/28/2021.  Last Tox[facility] 11/28/2021.  No future appointments scheduled.

## 2022-01-10 ENCOUNTER — Encounter: Payer: Self-pay | Admitting: Internal Medicine

## 2022-01-10 ENCOUNTER — Telehealth: Payer: Self-pay

## 2022-01-10 NOTE — Telephone Encounter (Signed)
Requesting a letter stating she is not taking anything for diabetes. Pt would like this letter to be faxed to [facility] eye care.

## 2022-01-22 ENCOUNTER — Ambulatory Visit: Payer: Self-pay | Admitting: Dermatology

## 2022-01-22 ENCOUNTER — Ambulatory Visit: Payer: Medicaid Other | Admitting: Dermatology

## 2022-02-10 ENCOUNTER — Encounter (HOSPITAL_COMMUNITY): Payer: Self-pay

## 2022-02-10 ENCOUNTER — Emergency Department (HOSPITAL_COMMUNITY)
Admission: EM | Admit: 2022-02-10 | Discharge: 2022-02-10 | Disposition: A | Discharge status: Home / Self Care | Payer: Commercial Managed Care - HMO | Attending: Emergency Medicine | Admitting: Emergency Medicine

## 2022-02-10 ENCOUNTER — Other Ambulatory Visit: Payer: Self-pay

## 2022-02-10 DIAGNOSIS — L02214 Cutaneous abscess of groin: Secondary | ICD-10-CM | POA: Insufficient documentation

## 2022-02-10 DIAGNOSIS — E119 Type 2 diabetes mellitus without complications: Secondary | ICD-10-CM | POA: Insufficient documentation

## 2022-02-10 DIAGNOSIS — I10 Essential (primary) hypertension: Secondary | ICD-10-CM | POA: Insufficient documentation

## 2022-02-10 MED ORDER — CEPHALEXIN 500 MG PO CAPS: 500.0000 mg | ORAL_CAPSULE | Freq: Four times a day (QID) | ORAL | 0 refills | Status: AC

## 2022-02-10 MED ORDER — ACETAMINOPHEN 325 MG PO TABS: 650.0000 mg | ORAL_TABLET | Freq: Four times a day (QID) | ORAL | Status: DC | PRN

## 2022-02-10 MED ORDER — LIDOCAINE HCL (PF) 1 % IJ SOLN
5.0000 mL | Freq: Once | INTRAMUSCULAR | Status: AC
  Administered 2022-02-10: 5 mL
  Filled 2022-02-10: qty 30

## 2022-02-10 NOTE — ED Triage Notes (Signed)
Ambulatory to ED with c/o abscess to inside of L groin x 3 days. Denies fever or chills.

## 2022-02-10 NOTE — Discharge Instructions (Addendum)
Incision and drainage aftercare instructions have been provided for you to review at your leisure.  An antibiotic is also been called into the pharmacy by the name of Keflex.  Take 1 capsule every 6 hours for the next 5 days.  Always take with plenty of food and water.  You may also take an probiotic daily to reduce the risk of an antibiotic associated yeast infection.  Follow-up with your PCP within the next 2 to 3 days for reevaluation and continued medical management.  Also continue to manage pain with ibuprofen and Tylenol, and plenty of rest.  Return to the ED for any new or worsening symptoms as discussed.

## 2022-02-10 NOTE — ED Provider Notes (Signed)
[place] DEPT Provider Note   CSN: [id number] Arrival date & time: 02/10/22  0100     History  Chief Complaint  Patient presents with   Abscess    [name] is a 56 y.o. female presenting today with a painful abscess on the inside of the left leg.  States has been there for 3 days.  States there has been some leaking from the abscess since arrival to the ED.  Denies fever or chills.  Without vaginal or urinary symptoms.  Has had an abscess near the area before, however this was several years ago.  No other complaints at this time.  With history of essential HTN, HSV-2, DMT2, prior Roux-en-Y gastric bypass, support dermatitis, opioid use disorder in remission, mood disorder, hyperlipidemia, OSA.  The history is provided by the patient and medical records.  Abscess    Home Medications Prior to Admission medications   Medication Sig Start Date End Date Taking? Authorizing Provider  cephALEXin (KEFLEX) 500 MG capsule Take 1 capsule (500 mg total) by mouth 4 (four) times daily for 5 days. 02/10/22 02/15/22 Yes Prince Rome, PA-C  atorvastatin (LIPITOR) 20 MG tablet Take 1 tablet (20 mg total) by mouth daily. 08/08/21 08/08/22  Lottie Mussel, MD  Buprenorphine HCl-Naloxone HCl (SUBOXONE) 8-2 MG FILM Place 1 Film under the tongue in the morning and at bedtime. 01/08/22 04/08/22  Lottie Mussel, MD  ciclopirox (PENLAC) 8 % solution Apply topically at bedtime. 08/08/21   Lottie Mussel, MD  ciclopirox (PENLAC) 8 % solution Apply topically at bedtime. Apply over nail and surrounding skin. Apply daily over previous coat. After seven (7) days, may remove with alcohol and continue cycle. 11/01/21   Lorenda Peck, DPM  cyclobenzaprine (FLEXERIL) 5 MG tablet Take 1 tablet (5 mg total) by mouth 3 (three) times daily as needed for muscle spasms. 08/08/21   Lottie Mussel, MD  hydrochlorothiazide (HYDRODIURIL) 25 MG tablet Take 1 tablet (25 mg total) by mouth daily.  10/01/21 10/01/22  Lottie Mussel, MD  ketoconazole (NIZORAL) 2 % shampoo Apply topically 2 (two) times a week. 08/09/21   Lottie Mussel, MD  latanoprost (XALATAN) 0.005 % ophthalmic solution Administer 1 drop to both eyes nightly. 12/04/21   Lottie Mussel, MD  losartan (COZAAR) 25 MG tablet Take 1 tablet (25 mg total) by mouth daily. 08/08/21 08/08/22  Lottie Mussel, MD  Misc. Devices MISC Salicylic acid 7.6% and Undecylenic acid.  Dispense 60 mL.  Please apply to affected nails daily as directed 03/22/21   [provider]  omeprazole (PRILOSEC) 40 MG capsule Take 1 capsule (40 mg total) by mouth daily. 08/08/21 08/08/22  Lottie Mussel, MD  Prenatal Vit-DSS-Fe Fum-FA (PRENATAL 19) tablet Take 1 tablet by mouth daily. 02/20/21   [provider]  triamcinolone cream (KENALOG) 0.5 % APPLY TOPICALLY 3 TIMES DAILY NOT FOR CHRONIC DAILY USE 08/08/21   Lottie Mussel, MD  ursodiol (ACTIGALL) 300 MG capsule Take one tab twice a day with food BEGINNING 14 DAYS AFTER SURGERY 08/08/21   Lottie Mussel, MD      Allergies    Patient has no known allergies.    Review of Systems   Review of Systems  Skin:        Abscess    Physical Exam Updated Vital Signs BP 134/67   Pulse 92   Temp 97.8 F (36.6 C)   Resp 20   Ht '5\' 8"'$  (1.727 m)   Wt 122.5 kg   SpO2  94%   BMI 41.05 kg/m  Physical Exam Vitals and nursing note reviewed. Exam conducted with a chaperone present.  Constitutional:      General: She is not in acute distress.    Appearance: She is well-developed. She is obese. She is not ill-appearing, toxic-appearing or diaphoretic.  HENT:     Head: Normocephalic and atraumatic.  Eyes:     Conjunctiva/sclera: Conjunctivae normal.  Cardiovascular:     Rate and Rhythm: Normal rate and regular rhythm.     Heart sounds: No murmur heard. Pulmonary:     Effort: Pulmonary effort is normal. No respiratory distress.     Breath sounds: Normal breath sounds.  Abdominal:     Palpations: Abdomen is  soft.     Tenderness: There is no abdominal tenderness.  Musculoskeletal:        General: No swelling.     Cervical back: Neck supple.  Skin:    General: Skin is warm and dry.     Capillary Refill: Capillary refill takes less than 2 seconds.     Comments: 1 cm abscess of the left upper pannus/groin region, with slow active purulent drainage from a small 1 to 2 mm opening.  With 2 cm surrounding evidence of mild cellulitis.  Neurological:     Mental Status: She is alert and oriented to person, place, and time.  Psychiatric:        Mood and Affect: Mood normal.     ED Results / Procedures / Treatments   Labs (all labs ordered are listed, but only abnormal results are displayed) Labs Reviewed - No data to display  EKG None  Radiology No results found.  Procedures .Marland KitchenIncision and Drainage  Date/Time: 02/10/2022 12:59 PM  Performed by: Prince Rome, PA-C Authorized by: Prince Rome, PA-C   Consent:    Consent obtained:  Verbal   Consent given by:  Patient   Risks, benefits, and alternatives were discussed: yes     Risks discussed:  Bleeding, incomplete drainage, pain and damage to other organs   Alternatives discussed:  No treatment, delayed treatment, alternative treatment, referral and observation Universal protocol:    Procedure explained and questions answered to patient or proxy's satisfaction: yes     Site/side marked: yes     Immediately prior to procedure, a time out was called: yes     Patient identity confirmed:  Verbally with patient and arm band Location:    Type:  Abscess   Location:  Trunk   Trunk location:  Abdomen (Lower left pannus/groin) Pre-procedure details:    Skin preparation:  Betadine Sedation:    Sedation type:  None Anesthesia:    Anesthesia method:  Local infiltration   Local anesthetic:  Lidocaine 1% WITH epi Procedure type:    Complexity:  Complex Procedure details:    Incision types:  Single straight   Incision depth:   Subcutaneous   Wound management:  Probed and deloculated, irrigated with saline and extensive cleaning   Drainage:  Purulent and bloody   Drainage amount:  Moderate   Packing materials:  1/4 in iodoform gauze Post-procedure details:    Procedure completion:  Tolerated well, no immediate complications     Medications Ordered in ED Medications  acetaminophen (TYLENOL) tablet 650 mg (has no administration in time range)  lidocaine (PF) (XYLOCAINE) 1 % injection 5 mL (has no administration in time range)    ED Course/ Medical Decision Making/ A&P  Medical Decision Making Risk OTC drugs. Prescription drug management.   56 y.o. female presents to the ED for concern of Abscess   This involves an extensive number of treatment options, and is a complaint that carries with it a high risk of complications and morbidity.  The emergent differential diagnosis prior to evaluation includes, but is not limited to: Abscess, lymphadenopathy, cellulitis, cyst  This is not an exhaustive differential.   Past Medical History / Co-morbidities / Social History: Hx of essential HTN, HSV-2, DMT2, prior Roux-en-Y gastric bypass, support dermatitis, opioid use disorder in remission, mood disorder, hyperlipidemia, OSA Social Determinants of Health include: None  Additional History:  None  Lab Tests: None  Imaging Studies: None  ED Course: Pt well-appearing on exam.  Presenting today with abscess of left groin over the last few days.  Small amount of actively draining purulent drainage noted on initial exam.  Patient with skin abscess amenable to incision and drainage.  Procedure performed as described above.  Abscess was mildly large enough to warrant some packing material.  Instructed to return to PCP/ED for wound recheck in 2 days.  Encouraged keeping the area clean and provided further post I&D care instructions.  Mild signs of cellulitis is surrounding skin and difficult  area to keep clean due to multiple comorbidities, excess tissue, and localization near GU area.  Therefore short course of antibiotics sent to pharmacy.  Recommended continued conservative management as well.  Pt satisfied with today's encounter.  Pt in NAD and in good condition at time of discharge.  Disposition: After consideration the patient's encounter today, I do not feel today's workup suggests an emergent condition requiring admission or immediate intervention beyond what has been performed at this time.  Safe for discharge; instructed to return immediately for worsening symptoms, change in symptoms or any other concerns.  I have reviewed the patients home medicines and have made adjustments as needed.  Discussed course of treatment with the patient, whom demonstrated understanding.  Patient in agreement and has no further questions.     This chart was dictated using voice recognition software.  Despite best efforts to proofread, errors can occur which can change the documentation meaning.         Final Clinical Impression(s) / ED Diagnoses Final diagnoses:  Abscess of left groin    Rx / DC Orders ED Discharge Orders          Ordered    cephALEXin (KEFLEX) 500 MG capsule  4 times daily        02/10/22 1301              Prince Rome, PA-C 01/56/15 [place], [place], DO 02/15/22 [phone]

## 2022-02-11 ENCOUNTER — Ambulatory Visit: Payer: 59 | Admitting: Neurology

## 2022-02-12 ENCOUNTER — Other Ambulatory Visit: Payer: Self-pay

## 2022-02-12 ENCOUNTER — Ambulatory Visit (HOSPITAL_COMMUNITY)
Admission: RE | Admit: 2022-02-12 | Discharge: 2022-02-12 | Disposition: A | Discharge status: Home / Self Care | Payer: Managed Care, Other (non HMO) | Source: Ambulatory Visit | Attending: Emergency Medicine | Admitting: Emergency Medicine

## 2022-02-12 ENCOUNTER — Encounter (HOSPITAL_COMMUNITY): Payer: Self-pay

## 2022-02-12 VITALS — BP 150/74 | HR 85 | Temp 98.6°F | Resp 18

## 2022-02-12 DIAGNOSIS — L02214 Cutaneous abscess of groin: Secondary | ICD-10-CM | POA: Diagnosis not present

## 2022-02-12 DIAGNOSIS — R21 Rash and other nonspecific skin eruption: Secondary | ICD-10-CM

## 2022-02-12 MED ORDER — BUPRENORPHINE HCL-NALOXONE HCL 8-2 MG SL FILM: 1.0000 | ORAL_FILM | Freq: Two times a day (BID) | SUBLINGUAL | 2 refills | Status: AC

## 2022-02-12 MED ORDER — PREDNISONE 20 MG PO TABS: 40.0000 mg | ORAL_TABLET | Freq: Every day | ORAL | 0 refills | Status: DC

## 2022-02-12 NOTE — Telephone Encounter (Signed)
Buprenorphine HCl-Naloxone HCl (SUBOXONE) 8-2 MG FILM, refill request @ [facility] [phone] - [place], [place] AT [address].

## 2022-02-12 NOTE — Telephone Encounter (Signed)
No future appoint.  Last seen 11/28/2021. No Tox[facility]ure on file.

## 2022-02-12 NOTE — Discharge Instructions (Signed)
For your abscess -Site appears to be healing without complication and on evaluation there was no packing present which means it most likely fell out on its own which is perfectly fine -Continue you to take your antibiotics as directed -Begin to hold warm compresses over the affected area to help soften the tissue and to help facilitate drainage -Skin will naturally close on its own, you may still occasionally feel firmness due to scarring of the tissue  For your facial rash -I do not want to increase the strength of the steroid you are currently using as this may cause thinning of your skin -We will use oral prednisone to help minimize the rash is much as possible -Begin use starting tomorrow every morning with food for 5 days -once Complete you may continue your topical cream -You may follow-up with your primary doctor in 1 to 2 weeks for reevaluation if your symptoms continue to persist

## 2022-02-12 NOTE — ED Provider Notes (Signed)
[facility]    CSN: [id number] Arrival date & time: 02/12/22  1823      History   Chief Complaint Chief Complaint  Patient presents with   Wound Check    had a boil lanceds saturday, need the packing removed - Entered by patient    HPI [name] is a 56 y.o. female.   Patient presents for evaluation for abscess to the left groin, was initially evaluated in the emergency department where incision and drainage with packing was completed.  Endorses that she was told to remove packing after a few days but when she went to assess it was no longer there, also able to feel firmness and is concerned if site is healing properly.  Denies fever or chills.  Endorses that she is taking Keflex as prescribed.    Patient concerned with facial rash that is present to the cheeks, nose and bilateral ears that began 3 days ago.  Rash is slightly pruritic and uncomfortable.  Has been using triamcinolone cream over the affected area but endorses it is ineffective.  Denies fever, chills, drainage, changes in soaps, lotions detergents, dietary changes or recent travel.  [place] is where a similar rash has occurred prior but it was years ago.   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   Seborrheic dermatitis 08/01/2020   Other hyperlipidemia 10/21/2019   Sleep apnea, obstructive 10/21/2019   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   Mood  disorder ([place]) 12/18/2017   Essential hypertension 11/25/2017   Type 2 diabetes mellitus without complications ([place]) 38/75/6433   Severe obesity (BMI >= 40) ([place]) 09/30/2017    Past Surgical History:  Procedure Laterality Date   ABDOMINAL HYSTERECTOMY  1999   CARPAL TUNNEL RELEASE Right 10/25/2021   Procedure: RIGHT CARPAL TUNNEL RELEASE;  Surgeon: Mcarthur Rossetti, MD;  Location: [facility];  Service: Orthopedics;  Laterality: Right;   FRACTURE SURGERY     LUNG LOBECTOMY  2010   right lower lobe   ROUX-EN-Y GASTRIC BYPASS  10/30/2020    OB History   No obstetric history on file.      Home Medications    Prior to Admission medications   Medication Sig Start Date End Date Taking? Authorizing Provider  atorvastatin (LIPITOR) 20 MG tablet Take 1 tablet (20 mg total) by mouth daily. 08/08/21 08/08/22  Lottie Mussel, MD  Buprenorphine HCl-Naloxone HCl (SUBOXONE) 8-2 MG FILM Place 1 Film under the tongue in the morning and at bedtime. 02/12/22 05/13/22  Lottie Mussel, MD  cephALEXin (KEFLEX) 500 MG capsule Take 1 capsule (500 mg total) by mouth 4 (four) times daily for 5 days. 02/10/22 07/26/49  Prince Rome, PA-C  ciclopirox (PENLAC) 8 % solution Apply topically at bedtime. 08/08/21   Lottie Mussel, MD  ciclopirox (PENLAC) 8 % solution Apply topically at bedtime. Apply over nail  and surrounding skin. Apply daily over previous coat. After seven (7) days, may remove with alcohol and continue cycle. 11/01/21   Lorenda Peck, DPM  cyclobenzaprine (FLEXERIL) 5 MG tablet Take 1 tablet (5 mg total) by mouth 3 (three) times daily as needed for muscle spasms. 08/08/21   Lottie Mussel, MD  hydrochlorothiazide (HYDRODIURIL) 25 MG tablet Take 1 tablet (25 mg total) by mouth daily. 10/01/21 10/01/22  Lottie Mussel, MD  ketoconazole (NIZORAL) 2 % shampoo Apply topically 2 (two) times a week. 08/09/21   Lottie Mussel, MD  latanoprost (XALATAN) 0.005 % ophthalmic solution Administer 1  drop to both eyes nightly. 12/04/21   Lottie Mussel, MD  losartan (COZAAR) 25 MG tablet Take 1 tablet (25 mg total) by mouth daily. 08/08/21 08/08/22  Lottie Mussel, MD  Misc. Devices MISC Salicylic acid 5.4% and Undecylenic acid.  Dispense 60 mL.  Please apply to affected nails daily as directed 03/22/21   [provider]  omeprazole (PRILOSEC) 40 MG capsule Take 1 capsule (40 mg total) by mouth daily. 08/08/21 08/08/22  Lottie Mussel, MD  Prenatal Vit-DSS-Fe Fum-FA (PRENATAL 19) tablet Take 1 tablet by mouth daily. 02/20/21   [provider]  triamcinolone cream (KENALOG) 0.5 % APPLY TOPICALLY 3 TIMES DAILY NOT FOR CHRONIC DAILY USE 08/08/21   Lottie Mussel, MD  ursodiol (ACTIGALL) 300 MG capsule Take one tab twice a day with food BEGINNING 14 DAYS AFTER SURGERY 08/08/21   Lottie Mussel, MD    Family History Family History  Problem Relation Age of Onset   COPD Mother    Alcohol abuse Father    Pancreatic cancer Father    Diabetes Maternal Grandmother    Hyperlipidemia Maternal Grandmother    Hypertension Maternal Grandmother     Social History Social History   Tobacco Use   Smoking status: Former    Types: Cigarettes    Quit date: 07/25/2013    Years since quitting: 8.5   Smokeless tobacco: Never  Vaping Use   Vaping Use: Never used  Substance Use Topics   Alcohol use: Yes   Drug use: No     Allergies   Patient has no known allergies.   Review of Systems Review of Systems   Physical Exam Triage Vital Signs ED Triage Vitals  Enc Vitals Group     BP 02/12/22 1842 (!) 150/74     Pulse Rate 02/12/22 1842 85     Resp 02/12/22 1842 18     Temp 02/12/22 1842 98.6 F (37 C)     Temp Source 02/12/22 1842 Oral     SpO2 02/12/22 1842 97 %     Weight --      Height --      Head Circumference --      Peak Flow --      Pain Score 02/12/22 1841 0     Pain Loc --      Pain Edu? --      Excl. in [place]? --    No data found.  Updated Vital Signs BP (!) 150/74 (BP  Location: Left Arm)   Pulse 85   Temp 98.6 F (37 C) (Oral)   Resp 18   SpO2 97%   Visual Acuity Right Eye Distance:   Left Eye Distance:   Bilateral Distance:    Right Eye Near:   Left Eye Near:    Bilateral Near:     Physical Exam Constitutional:      Appearance: Normal appearance.  HENT:     Head: Normocephalic.  Eyes:     Extraocular Movements: Extraocular movements intact.  Pulmonary:     Effort: Pulmonary effort is normal.  Skin:    Comments: 1 cm abscess present to the left groin, no packing is present, scant yellow drainage noted, surrounding skin is not erythematous or tender  Erythematous flaking rash present to the nose bridge, bilateral cheeks and the auricle of the bilateral ears, nondraining, nontender  Neurological:     Mental Status: She is alert and oriented to person, place, and time. Mental status is at baseline.  Psychiatric:        Mood and Affect: Mood normal.        Behavior: Behavior normal.      UC Treatments / Results  Labs (all labs ordered are listed, but only abnormal results are displayed) Labs Reviewed - No data to display  EKG   Radiology No results found.  Procedures Procedures (including critical care time)  Medications Ordered in UC Medications - No data to display  Initial Impression / Assessment and Plan / UC Course  I have reviewed the triage vital signs and the nursing notes.  Pertinent labs & imaging results that were available during my care of the patient were reviewed by me and considered in my medical decision making (see chart for details).  Abscess of left groin, rash  Site appears to be healing appropriately, discussed with patient, advised to continue using Keflex as directed and may hold warm compresses over the affected area to help facilitate drainage and for general comfort, may follow-up as needed for further concerns regarding healing  Rash appears to be inflammatory with no signs of infection,  currently taking Keflex which will help prevent this as well, prescribed oral prednisone for additional supportive measures, will not increase strength of topical steroid cream as she is applying it to her face, recommended follow-up with PCP in 1 to 2 weeks if symptoms continue to persist Final Clinical Impressions(s) / UC Diagnoses   Final diagnoses:  None   Discharge Instructions   None    ED Prescriptions   None    PDMP not reviewed this encounter.   Hans Eden, NP 02/12/22 1921

## 2022-02-12 NOTE — ED Triage Notes (Signed)
Pt presents for a wound check. States she was seen at [facility] and had an abscess packed. States she was advised to remove some of the packing so the wound could heal from the inside out. States she looked at the wound this morning and did not see any packing.   Also endorses a rash on her face and ears x 2 days. States have tried triamcinolone for relief.

## 2022-02-20 ENCOUNTER — Ambulatory Visit (HOSPITAL_COMMUNITY): Admit: 2022-02-20 | Payer: 59

## 2022-02-21 ENCOUNTER — Ambulatory Visit: Payer: Self-pay

## 2022-02-22 ENCOUNTER — Ambulatory Visit (HOSPITAL_COMMUNITY)
Admission: RE | Admit: 2022-02-22 | Discharge: 2022-02-22 | Disposition: A | Discharge status: Home / Self Care | Payer: Commercial Managed Care - HMO | Source: Ambulatory Visit | Attending: Emergency Medicine | Admitting: Emergency Medicine

## 2022-02-22 ENCOUNTER — Ambulatory Visit (INDEPENDENT_AMBULATORY_CARE_PROVIDER_SITE_OTHER): Payer: Commercial Managed Care - HMO

## 2022-02-22 ENCOUNTER — Ambulatory Visit (HOSPITAL_COMMUNITY): Payer: Self-pay

## 2022-02-22 VITALS — BP 140/83 | HR 86 | Temp 97.7°F | Resp 12 | Ht 68.0 in | Wt 270.0 lb

## 2022-02-22 DIAGNOSIS — S62635A Displaced fracture of distal phalanx of left ring finger, initial encounter for closed fracture: Secondary | ICD-10-CM | POA: Diagnosis not present

## 2022-02-22 DIAGNOSIS — S62625A Displaced fracture of medial phalanx of left ring finger, initial encounter for closed fracture: Secondary | ICD-10-CM | POA: Diagnosis not present

## 2022-02-22 DIAGNOSIS — S62639A Displaced fracture of distal phalanx of unspecified finger, initial encounter for closed fracture: Secondary | ICD-10-CM

## 2022-02-22 NOTE — ED Provider Notes (Signed)
[facility]    CSN: [id number] Arrival date & time: 02/22/22  1523      History   Chief Complaint Chief Complaint  Patient presents with   Finger Injury    Entered by patient    HPI [name] is a 56 y.o. female.  Presents with finger injury that occurred 5 days ago. Reports left hand ring finger was jammed.  She has had pain and swelling since then.  Has not tried any interventions.  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[place])    Chronic pain    Depression    Diabetes ([place])    Hyperlipidemia    Hypertension    PONV (postoperative nausea and vomiting)    Sleep apnea     Patient Active Problem List   Diagnosis Date Noted   Heat intolerance 11/28/2021   Vision changes 11/28/2021   Carpal tunnel syndrome, right upper limb 09/19/2021   Osteoarthritis of right shoulder 08/08/2021   Opioid use disorder in remission 07/04/2021   Healthcare maintenance 07/04/2021   Onychomycosis 12/12/2020   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   Seborrheic dermatitis 08/01/2020   Other hyperlipidemia 10/21/2019   Sleep apnea, obstructive 10/21/2019   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   Mood disorder ([place]) 12/18/2017   Essential hypertension 11/25/2017   Type 2 diabetes mellitus without complications ([place]) 43/15/4008   Severe obesity (BMI >= 40) ([place]) 09/30/2017    Past Surgical History:  Procedure Laterality Date   ABDOMINAL HYSTERECTOMY  1999   CARPAL TUNNEL RELEASE Right 10/25/2021   Procedure: RIGHT CARPAL TUNNEL RELEASE;  Surgeon: Mcarthur Rossetti, MD;  Location: [facility];  Service: Orthopedics;  Laterality: Right;   FRACTURE SURGERY     LUNG LOBECTOMY  2010   right lower lobe   ROUX-EN-Y GASTRIC BYPASS  10/30/2020    OB History   No obstetric history on file.      Home Medications    Prior to Admission medications   Medication Sig Start Date  End Date Taking? Authorizing Provider  atorvastatin (LIPITOR) 20 MG tablet Take 1 tablet (20 mg total) by mouth daily. 08/08/21 08/08/22  Lottie Mussel, MD  Buprenorphine HCl-Naloxone HCl (SUBOXONE) 8-2 MG FILM Place 1 Film under the tongue in the morning and at bedtime. 02/12/22 05/13/22  Lottie Mussel, MD  ciclopirox (PENLAC) 8 % solution Apply topically at bedtime. 08/08/21   Lottie Mussel, MD  ciclopirox (PENLAC) 8 % solution Apply topically at bedtime. Apply over nail and surrounding skin. Apply daily over previous coat. After seven (7) days, may remove with alcohol and continue cycle. 11/01/21   Lorenda Peck, DPM  cyclobenzaprine (FLEXERIL) 5 MG tablet Take 1 tablet (5 mg total) by mouth 3 (three) times daily as needed for muscle spasms. 08/08/21   Lottie Mussel, MD  hydrochlorothiazide (HYDRODIURIL) 25 MG tablet Take 1 tablet (25 mg total) by mouth daily. 10/01/21 10/01/22  Lottie Mussel, MD  ketoconazole (NIZORAL) 2 % shampoo Apply topically 2 (two) times a week. 08/09/21   Lottie Mussel, MD  latanoprost (XALATAN) 0.005 % ophthalmic solution Administer 1 drop to both eyes nightly. 12/04/21   Lottie Mussel, MD  losartan (COZAAR) 25 MG tablet Take 1 tablet (25 mg total) by mouth daily. 08/08/21 08/08/22  Lottie Mussel, MD  Misc. Devices MISC Salicylic acid 6.7% and Undecylenic acid.  Dispense 60 mL.  Please apply  to affected nails daily as directed 03/22/21   [provider]  omeprazole (PRILOSEC) 40 MG capsule Take 1 capsule (40 mg total) by mouth daily. 08/08/21 08/08/22  Lottie Mussel, MD  predniSONE (DELTASONE) 20 MG tablet Take 2 tablets (40 mg total) by mouth daily. 02/12/22   White, Leitha Schuller, NP  Prenatal Vit-DSS-Fe Fum-FA (PRENATAL 19) tablet Take 1 tablet by mouth daily. 02/20/21   [provider]  triamcinolone cream (KENALOG) 0.5 % APPLY TOPICALLY 3 TIMES DAILY NOT FOR CHRONIC DAILY USE 08/08/21   Lottie Mussel, MD  ursodiol (ACTIGALL) 300 MG capsule Take one tab twice a day with  food BEGINNING 14 DAYS AFTER SURGERY 08/08/21   Lottie Mussel, MD    Family History Family History  Problem Relation Age of Onset   COPD Mother    Alcohol abuse Father    Pancreatic cancer Father    Diabetes Maternal Grandmother    Hyperlipidemia Maternal Grandmother    Hypertension Maternal Grandmother     Social History Social History   Tobacco Use   Smoking status: Former    Types: Cigarettes    Quit date: 07/25/2013    Years since quitting: 8.5   Smokeless tobacco: Never  Vaping Use   Vaping Use: Never used  Substance Use Topics   Alcohol use: Yes   Drug use: No     Allergies   Patient has no known allergies.   Review of Systems Review of Systems Per HPI  Physical Exam Triage Vital Signs ED Triage Vitals  Enc Vitals Group     BP 02/22/22 1538 (!) 140/83     Pulse Rate 02/22/22 1538 86     Resp 02/22/22 1538 12     Temp 02/22/22 1538 97.7 F (36.5 C)     Temp Source 02/22/22 1538 Oral     SpO2 02/22/22 1538 98 %     Weight 02/22/22 1536 270 lb (122.5 kg)     Height 02/22/22 1536 '5\' 8"'$  (1.727 m)     Head Circumference --      Peak Flow --      Pain Score 02/22/22 1535 5     Pain Loc --      Pain Edu? --      Excl. in [place]? --    No data found.  Updated Vital Signs BP (!) 140/83 (BP Location: Right Arm)   Pulse 86   Temp 97.7 F (36.5 C) (Oral)   Resp 12   Ht '5\' 8"'$  (1.727 m)   Wt 270 lb (122.5 kg)   SpO2 98%   BMI 41.05 kg/m    Physical Exam Vitals and nursing note reviewed.  Constitutional:      Appearance: Normal appearance.     Comments: Very pleasant and polite 56 year old female  Cardiovascular:     Rate and Rhythm: Normal rate and regular rhythm.     Pulses: Normal pulses.  Pulmonary:     Effort: Pulmonary effort is normal.  Musculoskeletal:        General: Tenderness present. No swelling or deformity.     Comments: Left hand ring finger tender to palpation distally.  There is no obvious deformity.  No noted swelling or  discoloration.  No nail damage.  Cap refill less than 2 seconds  Skin:    General: Skin is warm and dry.     Capillary Refill: Capillary refill takes less than 2 seconds.  Neurological:     Mental Status: She is alert  and oriented to person, place, and time.      UC Treatments / Results  Labs (all labs ordered are listed, but only abnormal results are displayed) Labs Reviewed - No data to display  EKG  Radiology DG Hand Complete Left  Result Date: 02/22/2022 CLINICAL DATA:  Traumatic injury to the ring finger on 02/17/2022 EXAM: LEFT HAND - COMPLETE 3 VIEW COMPARISON:  None Available. FINDINGS: Nondisplaced avulsion fracture of the ring finger middle phalangeal base. Mild degenerative changes characterized by joint space narrowing and osteophyte formation at the middle finger interphalangeal and first carpometacarpal joints. Diffuse soft tissue swelling about the ring finger. IMPRESSION: Nondisplaced avulsion fracture of the ring finger middle phalangeal base. Diffuse soft tissue swelling about the ring finger. Electronically Signed   By: Darrin Nipper M.D.   On: 02/22/2022 16:16    Procedures Procedures   Medications Ordered in UC Medications - No data to display  Initial Impression / Assessment and Plan / UC Course  I have reviewed the triage vital signs and the nursing notes.  Pertinent labs & imaging results that were available during my care of the patient were reviewed by me and considered in my medical decision making (see chart for details).  Left hand x-ray showing nondisplaced avulsion fracture of ring finger, base of middle phalanx.  Buddy taped in clinic.  Discussed using ice and/or ibuprofen for any pain or swelling. Return precautions discussed. Patient agrees to plan  Final Clinical Impressions(s) / UC Diagnoses   Final diagnoses:  Closed avulsion fracture of distal phalanx of finger, initial encounter     Discharge Instructions      Happy early birthday!  You  have a small "chip" fracture of the ring finger.  This is treated with buddy taping or splinting as needed.  I do recommend icing for 20 minutes at a time a few times daily to help with any swelling or pain.  You can also take ibuprofen or Tylenol as needed.     ED Prescriptions   None    PDMP not reviewed this encounter.   Jaycen Vercher, [place], [place] 02/22/22 1714

## 2022-02-22 NOTE — ED Triage Notes (Signed)
Pt injured finger playing on 02/17/2022, causing pain and swelling on left hand

## 2022-02-22 NOTE — Discharge Instructions (Addendum)
Happy early birthday!  You have a small "chip" fracture of the ring finger.  This is treated with buddy taping or splinting as needed.  I do recommend icing for 20 minutes at a time a few times daily to help with any swelling or pain.  You can also take ibuprofen or Tylenol as needed.

## 2022-02-28 LAB — HM DIABETES EYE EXAM

## 2022-03-20 ENCOUNTER — Ambulatory Visit (HOSPITAL_COMMUNITY): Payer: Self-pay

## 2022-03-28 ENCOUNTER — Other Ambulatory Visit: Payer: Self-pay

## 2022-03-28 ENCOUNTER — Encounter: Payer: Self-pay | Admitting: Dietician

## 2022-03-28 MED ORDER — LATANOPROST 0.005 % OP SOLN
OPHTHALMIC | 3 refills | Status: DC
Start: 2022-03-28 — End: 2022-08-05

## 2022-04-15 ENCOUNTER — Other Ambulatory Visit: Payer: Self-pay

## 2022-04-15 ENCOUNTER — Encounter: Payer: Commercial Managed Care - HMO | Admitting: Student

## 2022-04-15 MED ORDER — CYCLOBENZAPRINE HCL 5 MG PO TABS
5.0000 mg | ORAL_TABLET | Freq: Three times a day (TID) | ORAL | 3 refills | Status: DC | PRN
Start: 2022-04-15 — End: 2022-10-09

## 2022-04-17 ENCOUNTER — Telehealth: Payer: Self-pay | Admitting: Podiatry

## 2022-04-17 NOTE — Telephone Encounter (Signed)
Pt called asking for an appt on 11.7 because one of her toenails looks like it is coming off and the others are discolored. Pt stated she never really got an answer from the previous doctor as to what it was. I was looking for an available appt and pt stated she is in class and has to call back.

## 2022-04-23 ENCOUNTER — Ambulatory Visit (INDEPENDENT_AMBULATORY_CARE_PROVIDER_SITE_OTHER): Payer: Commercial Managed Care - HMO | Admitting: Student

## 2022-04-23 ENCOUNTER — Other Ambulatory Visit: Payer: Self-pay

## 2022-04-23 ENCOUNTER — Encounter: Payer: Self-pay | Admitting: Student

## 2022-04-23 ENCOUNTER — Ambulatory Visit (INDEPENDENT_AMBULATORY_CARE_PROVIDER_SITE_OTHER): Payer: Commercial Managed Care - HMO

## 2022-04-23 ENCOUNTER — Ambulatory Visit: Payer: Self-pay | Admitting: Podiatry

## 2022-04-23 VITALS — BP 142/80 | HR 78 | Temp 98.2°F | Ht 68.0 in | Wt 282.9 lb

## 2022-04-23 VITALS — BP 163/84 | HR 78 | Temp 98.2°F | Ht 68.0 in | Wt 282.9 lb

## 2022-04-23 DIAGNOSIS — Z87891 Personal history of nicotine dependence: Secondary | ICD-10-CM | POA: Diagnosis not present

## 2022-04-23 DIAGNOSIS — E119 Type 2 diabetes mellitus without complications: Secondary | ICD-10-CM | POA: Diagnosis not present

## 2022-04-23 DIAGNOSIS — I10 Essential (primary) hypertension: Secondary | ICD-10-CM | POA: Diagnosis not present

## 2022-04-23 DIAGNOSIS — Z1231 Encounter for screening mammogram for malignant neoplasm of breast: Secondary | ICD-10-CM

## 2022-04-23 DIAGNOSIS — Z0000 Encounter for general adult medical examination without abnormal findings: Secondary | ICD-10-CM

## 2022-04-23 DIAGNOSIS — F1191 Opioid use, unspecified, in remission: Secondary | ICD-10-CM

## 2022-04-23 DIAGNOSIS — Z7984 Long term (current) use of oral hypoglycemic drugs: Secondary | ICD-10-CM

## 2022-04-23 LAB — GLUCOSE, CAPILLARY: Glucose-Capillary: 132 mg/dL — ABNORMAL HIGH (ref 70–99)

## 2022-04-23 LAB — POCT GLYCOSYLATED HEMOGLOBIN (HGB A1C): Hemoglobin A1C: 6.7 % — AB (ref 4.0–5.6)

## 2022-04-23 MED ORDER — LOSARTAN POTASSIUM 50 MG PO TABS: 50.0000 mg | ORAL_TABLET | Freq: Every day | ORAL | 5 refills | Status: DC

## 2022-04-23 MED ORDER — METFORMIN HCL ER 500 MG PO TB24: 500.0000 mg | ORAL_TABLET | Freq: Every day | ORAL | 0 refills | Status: DC

## 2022-04-23 MED ORDER — LOSARTAN POTASSIUM 50 MG PO TABS: 50.0000 mg | ORAL_TABLET | Freq: Every day | ORAL | 11 refills | Status: DC

## 2022-04-23 NOTE — Progress Notes (Signed)
Subjective:   [name] is a 56 y.o. female who presents for an Initial Medicare Annual Wellness Visit. I connected with  [name] on 04/23/22 by a  Face-To-Face  enabled telemedicine application and verified that I am speaking with the correct person using two identifiers.  Patient Location: Other:  Office/Clinic  Provider Location: Office/Clinic  I discussed the limitations of evaluation and management by telemedicine. The patient expressed understanding and agreed to proceed.  Review of Systems    Defer to PCP       Objective:    Today's Vitals   04/23/22 1036  BP: (!) 163/84  Pulse: 78  Temp: 98.2 F (36.8 C)  TempSrc: Oral  SpO2: 99%  Weight: 282 lb 14.4 oz (128.3 kg)  Height: '5\' 8"'$  (1.727 m)  PainSc: 2    Body mass index is 43.01 kg/m.     04/23/2022   10:38 AM 04/23/2022    9:02 AM 02/10/2022    1:24 AM 11/28/2021   10:35 AM 10/25/2021    7:33 AM 10/17/2021   11:15 AM 08/08/2021   11:08 AM  Advanced Directives  Does Patient Have a Medical Advance Directive? No No No No No No No  Would patient like information on creating a medical advance directive? No - Patient declined No - Patient declined  No - Patient declined No - Patient declined No - Patient declined No - Patient declined    Current Medications (verified) Outpatient Encounter Medications as of 04/23/2022  Medication Sig   atorvastatin (LIPITOR) 20 MG tablet Take 1 tablet (20 mg total) by mouth daily.   Buprenorphine HCl-Naloxone HCl (SUBOXONE) 8-2 MG FILM Place 1 Film under the tongue in the morning and at bedtime.   ciclopirox (PENLAC) 8 % solution Apply topically at bedtime.   cyclobenzaprine (FLEXERIL) 5 MG tablet Take 1 tablet (5 mg total) by mouth 3 (three) times daily as needed for muscle spasms.   hydrochlorothiazide (HYDRODIURIL) 25 MG tablet Take 1 tablet (25 mg total) by mouth daily.   ketoconazole (NIZORAL) 2 % shampoo Apply topically 2 (two) times a week.   latanoprost (XALATAN)  0.005 % ophthalmic solution Administer 1 drop to both eyes nightly.   losartan (COZAAR) 50 MG tablet Take 1 tablet (50 mg total) by mouth daily.   metFORMIN (GLUCOPHAGE-XR) 500 MG 24 hr tablet Take 1 tablet (500 mg total) by mouth daily with breakfast.   Misc. Devices MISC Salicylic acid 4.4% and Undecylenic acid.  Dispense 60 mL.  Please apply to affected nails daily as directed   omeprazole (PRILOSEC) 40 MG capsule Take 1 capsule (40 mg total) by mouth daily.   Prenatal Vit-DSS-Fe Fum-FA (PRENATAL 19) tablet Take 1 tablet by mouth daily.   triamcinolone cream (KENALOG) 0.5 % APPLY TOPICALLY 3 TIMES DAILY NOT FOR CHRONIC DAILY USE   ursodiol (ACTIGALL) 300 MG capsule Take one tab twice a day with food BEGINNING 14 DAYS AFTER SURGERY   No facility-administered encounter medications on file as of 04/23/2022.    Allergies (verified) Patient has no known allergies.   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[place])    Chronic pain    Depression    Diabetes ([place])    Hyperlipidemia    Hypertension    PONV (postoperative nausea and vomiting)    Sleep apnea    Past Surgical History:  Procedure Laterality Date   ABDOMINAL HYSTERECTOMY  1999  CARPAL TUNNEL RELEASE Right 10/25/2021   Procedure: RIGHT CARPAL TUNNEL RELEASE;  Surgeon: Mcarthur Rossetti, MD;  Location: [facility];  Service: Orthopedics;  Laterality: Right;   FRACTURE SURGERY     LUNG LOBECTOMY  2010   right lower lobe   ROUX-EN-Y GASTRIC BYPASS  10/30/2020   Family History  Problem Relation Age of Onset   COPD Mother    Alcohol abuse Father    Pancreatic cancer Father    Diabetes Maternal Grandmother    Hyperlipidemia Maternal Grandmother    Hypertension Maternal Grandmother    Social History   Socioeconomic History   Marital status: Single    Spouse name: Not on file   Number of children: 3   Years of education: Not on file   Highest education  level: Not on file  Occupational History   Not on file  Tobacco Use   Smoking status: Former    Types: Cigarettes    Quit date: 07/25/2013    Years since quitting: 8.7   Smokeless tobacco: Never  Vaping Use   Vaping Use: Never used  Substance and Sexual Activity   Alcohol use: Yes   Drug use: No   Sexual activity: Yes    Birth control/protection: Surgical  Other Topics Concern   Not on file  Social History Narrative   Not on file   Social Determinants of Health   Financial Resource Strain: Medium Risk (04/23/2022)   Overall Financial Resource Strain (CARDIA)    Difficulty of Paying Living Expenses: Somewhat hard  Food Insecurity: Food Insecurity Present (04/23/2022)   Hunger Vital Sign    Worried About Running Out of Food in the Last Year: Sometimes true    Ran Out of Food in the Last Year: Sometimes true  Transportation Needs: No Transportation Needs (04/23/2022)   PRAPARE - Hydrologist (Medical): No    Lack of Transportation (Non-Medical): No  Physical Activity: Insufficiently Active (04/23/2022)   Exercise Vital Sign    Days of Exercise per Week: 3 days    Minutes of Exercise per Session: 30 min  Stress: Stress Concern Present (04/23/2022)   [place]    Feeling of Stress : To some extent  Social Connections: Socially Integrated (04/23/2022)   Social Connection and Isolation Panel [NHANES]    Frequency of Communication with Friends and Family: More than three times a week    Frequency of Social Gatherings with Friends and Family: Once a week    Attends Religious Services: More than 4 times per year    Active Member of [employer] or Organizations: Yes    Attends Music therapist: More than 4 times per year    Marital Status: Living with partner    Tobacco Counseling Counseling given: Not Answered   Clinical Intake:  Pre-visit preparation completed: Yes  Pain :  0-10 Pain Score: 2  Pain Type: Acute pain Pain Location: Shoulder Pain Orientation: Left Pain Descriptors / Indicators: Sharp Pain Onset: More than a month ago Pain Frequency: Intermittent     Nutritional Risks: None Diabetes: Yes  How often do you need to have someone help you when you read instructions, pamphlets, or other written materials from your doctor or pharmacy?: 1 - Never What is the last grade level you completed in school?: 18 years  Diabetic?Yes  Interpreter Needed?: No  Information entered by :: Midge Minium 04/23/22 10:37am   Activities of  Daily Living    04/23/2022   10:38 AM 04/23/2022    9:03 AM  In your present state of health, do you have any difficulty performing the following activities:  Hearing? 0 0  Vision? 1 1  Difficulty concentrating or making decisions? 0 0  Walking or climbing stairs? 1 1  Comment  AT TIMES  Dressing or bathing? 0 0  Doing errands, shopping? 0 0    Patient Care Team: Lottie Mussel, MD as PCP - General (Internal Medicine)  Indicate any recent Medical Services you may have received from other than [facility] providers in the past year (date may be approximate).     Assessment:   This is a routine wellness examination for [facility].  Hearing/Vision screen No results found.  Dietary issues and exercise activities discussed:     Goals Addressed   None   Depression Screen    04/23/2022   10:38 AM 04/23/2022    9:35 AM 11/28/2021   10:39 AM 08/08/2021   11:12 AM 07/04/2021   10:03 AM 02/23/2019    9:24 AM 12/16/2018   11:03 AM  PHQ 2/9 Scores  PHQ - 2 Score 4 4 0 2 2 0 0  PHQ- 9 Score '12 12  6 6      '$ Fall Risk    04/23/2022   10:38 AM 04/23/2022    9:02 AM 11/28/2021   10:34 AM 08/08/2021   11:08 AM 07/04/2021   10:00 AM  [place] in the past year? 0 0 0 0 0  Number falls in past yr: 0  0 0 0  Injury with Fall? 0  0 0 0  Risk for fall due to : No Fall Risks No Fall Risks     Follow up Falls evaluation  completed;Falls prevention discussed Falls evaluation completed Falls evaluation completed Falls evaluation completed Falls evaluation completed    FALL RISK PREVENTION PERTAINING TO THE HOME:  Any stairs in or around the home? No  If so, are there any without handrails? No  Home free of loose throw rugs in walkways, pet beds, electrical cords, etc? Yes  Adequate lighting in your home to reduce risk of falls? Yes   ASSISTIVE DEVICES UTILIZED TO PREVENT FALLS:  Life alert? No  Use of a cane, walker or w/c? No  Grab bars in the bathroom? No  Shower chair or bench in shower? No  Elevated toilet seat or a handicapped toilet? No   TIMED UP AND GO:  Was the test performed? Yes .  Length of time to ambulate 10 feet: 1 min  Gait slow and steady without use of assistive device  Cognitive Function:        04/23/2022   10:38 AM  6CIT Screen  What Year? 0 points  What month? 0 points  What time? 0 points  Count back from 20 0 points  Months in reverse 0 points  Repeat phrase 0 points  Total Score 0 points    Immunizations Immunization History  Administered Date(s) Administered   Tdap 04/22/2019    TDAP status: Up to date  Flu Vaccine status: Due, Education has been provided regarding the importance of this vaccine. Advised may receive this vaccine at local pharmacy or Health Dept. Aware to provide a copy of the vaccination record if obtained from local pharmacy or Health Dept. Verbalized acceptance and understanding.  Pneumococcal vaccine status: Due, Education has been provided regarding the importance of this vaccine. Advised may receive  this vaccine at local pharmacy or Health Dept. Aware to provide a copy of the vaccination record if obtained from local pharmacy or Health Dept. Verbalized acceptance and understanding.  Covid-19 vaccine status: Information provided on how to obtain vaccines.   Qualifies for Shingles Vaccine? No   Zostavax completed No   Shingrix  Completed?: No.    Education has been provided regarding the importance of this vaccine. Patient has been advised to call insurance company to determine out of pocket expense if they have not yet received this vaccine. Advised may also receive vaccine at local pharmacy or Health Dept. Verbalized acceptance and understanding.  Screening Tests Health Maintenance  Topic Date Due   COVID-19 Vaccine (1) Never done   Zoster Vaccines- Shingrix (1 of 2) Never done   MAMMOGRAM  02/04/2021   INFLUENZA VACCINE  Never done   PAP SMEAR-Modifier  02/09/2022   Diabetic kidney evaluation - Urine ACR  07/04/2022   FOOT EXAM  08/08/2022   HEMOGLOBIN A1C  10/22/2022   Diabetic kidney evaluation - GFR measurement  11/29/2022   OPHTHALMOLOGY EXAM  03/01/2023   COLONOSCOPY (Pts 45-69yr Insurance coverage will need to be confirmed)  02/10/2028   TETANUS/TDAP  04/21/2029   Hepatitis C Screening  Completed   HIV Screening  Completed   HPV VACCINES  Aged Out    Health Maintenance  Health Maintenance Due  Topic Date Due   COVID-19 Vaccine (1) Never done   Zoster Vaccines- Shingrix (1 of 2) Never done   MAMMOGRAM  02/04/2021   INFLUENZA VACCINE  Never done   PAP SMEAR-Modifier  02/09/2022    Colorectal cancer screening: Type of screening: Colonoscopy. Completed 02/09/2018. Repeat every 10 years  Mammogram status: Patient due    Lung Cancer Screening: (Low Dose CT Chest recommended if Age 55-80years, 30 pack-year currently smoking OR have quit w/in 15years.) does not qualify.   Lung Cancer Screening Referral: N/A  Additional Screening:  Hepatitis C Screening: does not qualify; Completed 07/04/2021  Vision Screening: Recommended annual ophthalmology exams for early detection of glaucoma and other disorders of the eye. Is the patient up to date with their annual eye exam?  Yes  Who is the provider or what is the name of the office in which the patient attends annual eye exams? Dr.Groat If pt is  not established with a provider, would they like to be referred to a provider to establish care? No .   Dental Screening: Recommended annual dental exams for proper oral hygiene  Community Resource Referral / Chronic Care Management: CRR required this visit?  No   CCM required this visit?  No      Plan:     I have personally reviewed and noted the following in the patient's chart:   Medical and social history Use of alcohol, tobacco or illicit drugs  Current medications and supplements including opioid prescriptions. Patient is not currently taking opioid prescriptions. Functional ability and status Nutritional status Physical activity Advanced directives List of other physicians Hospitalizations, surgeries, and ER visits in previous 12 months Vitals Screenings to include cognitive, depression, and falls Referrals and appointments  In addition, I have reviewed and discussed with patient certain preventive protocols, quality metrics, and best practice recommendations. A written personalized care plan for preventive services as well as general preventive health recommendations were provided to patient.     JKerin Perna [facility]  04/23/2022   Nurse Notes: Face-To-Face visit.  Ms. FKoeneman, Thank you for taking  time to come for your Medicare Wellness Visit. I appreciate your ongoing commitment to your health goals. Please review the following plan we discussed and let me know if I can assist you in the future.   These are the goals we discussed:  Goals   None     This is a list of the screening recommended for you and due dates:  Health Maintenance  Topic Date Due   COVID-19 Vaccine (1) Never done   Zoster (Shingles) Vaccine (1 of 2) Never done   Mammogram  02/04/2021   Flu Shot  Never done   Pap Smear  02/09/2022   Yearly kidney health urinalysis for diabetes  07/04/2022   Complete foot exam   08/08/2022   Hemoglobin A1C  10/22/2022   Yearly kidney function blood test  for diabetes  11/29/2022   Eye exam for diabetics  03/01/2023   Colon Cancer Screening  02/10/2028   Tetanus Vaccine  04/21/2029   Hepatitis C Screening: USPSTF Recommendation to screen - Ages 18-79 yo.  Completed   HIV Screening  Completed   HPV Vaccine  Aged Out

## 2022-04-23 NOTE — Progress Notes (Signed)
   CC: f/u HTN, T2DM  HPI:  [name] is a 56 y.o. female with history listed below presenting to the [facility] for f/u HTN, T2DM. Please see individualized problem based charting for full HPI.  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[facility])    Chronic pain    Depression    Diabetes ([place])    Hyperlipidemia    Hypertension    PONV (postoperative nausea and vomiting)    Sleep apnea     Review of Systems:  Negative aside from that listed in individualized problem based charting.  Physical Exam:  Vitals:   04/23/22 0901 04/23/22 0934  BP: (!) 163/84 (!) 142/80  Pulse: 78   Temp: 98.2 F (36.8 C)   TempSrc: Oral   SpO2: 99%   Weight: 282 lb 14.4 oz (128.3 kg)   Height: '5\' 8"'$  (1.727 m)    Physical Exam Constitutional:      Appearance: She is obese. She is not ill-appearing.  HENT:     Mouth/Throat:     Mouth: Mucous membranes are moist.     Pharynx: Oropharynx is clear.  Eyes:     Extraocular Movements: Extraocular movements intact.     Conjunctiva/sclera: Conjunctivae normal.     Pupils: Pupils are equal, round, and reactive to light.  Cardiovascular:     Rate and Rhythm: Normal rate and regular rhythm.     Pulses: Normal pulses.     Heart sounds: Normal heart sounds. No murmur heard.    No friction rub. No gallop.  Pulmonary:     Effort: Pulmonary effort is normal.     Breath sounds: Normal breath sounds. No wheezing, rhonchi or rales.  Abdominal:     General: Bowel sounds are normal. There is no distension.     Palpations: Abdomen is soft.     Tenderness: There is no abdominal tenderness.  Skin:    General: Skin is warm and dry.  Neurological:     General: No focal deficit present.     Mental Status: She is alert and oriented to person, place, and time.  Psychiatric:        Mood and Affect: Mood normal.        Behavior: Behavior normal.      Assessment & Plan:   See Encounters Tab for problem based  charting.  Patient discussed with Dr. Philipp Ovens

## 2022-04-23 NOTE — Assessment & Plan Note (Signed)
Patient living with HTN, currently taking losartan '25mg'$  daily and HCTZ '25mg'$  daily. Her BP is elevated today at 163/84 with repeat BP at 142/80. Will increase her losartan to '50mg'$  daily.  Plan: -continue HCTZ -increase losartan to '50mg'$  daily -f/u in 2 weeks

## 2022-04-23 NOTE — Patient Instructions (Addendum)
Ms. Cosper,  It was a pleasure seeing you in the clinic today.   Your hemoglobin A1c is higher today than last time. As we discussed, restart taking metformin and see how you tolerate it. Your blood pressure was high today, we will increase your losartan dose. Please pick up the new prescription and start taking one tablet daily. I have ordered a mammogram for screening. They will call you to schedule this appointment. Please come back in 2 weeks for your next visit.   Please call our clinic at [phone] if you have any questions or concerns. The best time to call is Monday-Friday from 9am-4pm, but there is someone available 24/7 at the same number. If you need medication refills, please notify your pharmacy one week in advance and they will send [place] a request.   Thank you for letting us take part in your care. We look forward to seeing you next time!

## 2022-04-23 NOTE — Assessment & Plan Note (Signed)
I have ordered a screening mammogram for patient this visit. She is due for a pap smear (has a history of a partial hysterectomy with prior PAPs being normal). Will obtain PAP smear at next visit as she is on a time constraint today.

## 2022-04-23 NOTE — Assessment & Plan Note (Signed)
Patient living with T2DM, currently diet controlled. Her last A1c was 6.1 in 06/2021, repeat today at 6.7. She is very well controlled from a diabetes standpoint and her A1c is at goal. She would like to try metformin at a low dose to see if she tolerates it. She will be following up in 2 weeks (time-constraint today as she has another appointment). Can discuss further about medication management. Should she develop any adverse effects to metformin, ozempic would be a great option for her given concomitant obesity (BMI 43).   Plan: -metformin '500mg'$  daily -f/u in 2 weeks -if unable to tolerate metformin, consider ozempic

## 2022-04-23 NOTE — Assessment & Plan Note (Signed)
Patient taking suboxone 8-'2mg'$  BID for OUD in remission. She denies any relapses, withdrawals, cravings on her current dosing and is tolerating it well. Her PDMP is appropriate. I have ordered a ToxAssure for her today. She had to leave early as she has another appointment, but states she will come back around 11AM today to provide urine sample. She obtained her last refill of suboxone on 04/13/2022 and thus has enough supply to last her until the end of this month. She is scheduled for follow up in 2 weeks and will likely need additional refills sent at that time.  Plan: -continue suboxone 8-'2mg'$  BID -f/u ToxAssure -f/u in 2 weeks, will likely need additional refills at that time

## 2022-04-25 NOTE — Progress Notes (Signed)
Internal Medicine Clinic Attending  Case discussed with Dr. Allyson Sabal  At the time of the visit.  We reviewed the resident's history and exam and pertinent patient test results.  I agree with the assessment, diagnosis, and plan of care documented in the resident's note.   Patient will need to provide another Utox at follow up. Needs to be aware that urine must be provided on site when asked.

## 2022-04-26 LAB — TOXASSURE SELECT,+ANTIDEPR,UR

## 2022-04-29 ENCOUNTER — Other Ambulatory Visit: Payer: Self-pay | Admitting: Student

## 2022-04-29 ENCOUNTER — Telehealth: Payer: Self-pay | Admitting: Orthopaedic Surgery

## 2022-04-29 NOTE — Telephone Encounter (Signed)
LMOM for patient letting her know we cannot provided the handicap

## 2022-04-29 NOTE — Telephone Encounter (Signed)
Pt called in requesting for handicap license plate... Pt requesting callback.Denise KitchenMarland [name]

## 2022-05-07 ENCOUNTER — Ambulatory Visit (INDEPENDENT_AMBULATORY_CARE_PROVIDER_SITE_OTHER): Payer: Commercial Managed Care - HMO | Admitting: Podiatry

## 2022-05-07 ENCOUNTER — Encounter: Payer: Commercial Managed Care - HMO | Admitting: Student

## 2022-05-07 DIAGNOSIS — B351 Tinea unguium: Secondary | ICD-10-CM | POA: Diagnosis not present

## 2022-05-07 MED ORDER — CICLOPIROX 8 % EX SOLN
Freq: Every day | CUTANEOUS | 3 refills | Status: AC
Start: 2022-05-07 — End: ?

## 2022-05-07 NOTE — Progress Notes (Signed)
  Subjective:  Patient ID: [name], female    DOB: 04/19/1966,  MRN: [id number]  Chief Complaint  Patient presents with   Nail Problem    Thick painful toenails    56 y.o. female presents with the above complaint. History confirmed with patient.  Nails continue to be thick and elongated the left hallux nail crumbled and peeled off  Objective:  Physical Exam: warm, good capillary refill, no trophic changes or ulcerative lesions, normal DP and PT pulses, and normal sensory exam. Left Foot: dystrophic yellowed discolored nail plates with subungual debris and left hallux nail has nearly fully avulsed Right Foot: dystrophic yellowed discolored nail plates with subungual debris  Assessment:   1. Onychomycosis      Plan:  Patient was evaluated and treated and all questions answered.  Discussed the etiology and treatment options for the condition in detail with the patient. Educated patient on the topical and oral treatment options for mycotic nails. Recommended debridement of the nails today. Sharp and mechanical debridement performed of all painful and mycotic nails today. Nails debrided in length and thickness using a nail nipper to level of comfort. Discussed treatment options including appropriate shoe gear. Follow up as needed for painful nails.  We also discussed the alternative of laser treatment and oral treatment.  She would prefer to avoid oral medications still at this point.  She would like to resume trying the [place] and she will let me know if she has any rash or abnormalities from this.  I do not think the compounded cream has been helpful    No follow-ups on file.

## 2022-05-14 ENCOUNTER — Telehealth: Payer: Self-pay

## 2022-05-14 NOTE — Telephone Encounter (Signed)
suboxone

## 2022-05-16 MED ORDER — BUPRENORPHINE HCL-NALOXONE HCL 8-2 MG SL FILM: 1.0000 | ORAL_FILM | Freq: Two times a day (BID) | SUBLINGUAL | 0 refills | Status: DC

## 2022-05-16 NOTE — Telephone Encounter (Signed)
Patient had OV on 11/7 and was told to continue Suboxone 8-2 films BID. Med expired off med list on 11/27. Called patient to r/s cancelled appt. No answer. VM left to call back to r/s appt in order to continue receiving Rx.

## 2022-05-16 NOTE — Telephone Encounter (Signed)
Patient returned call. Appt made for 12/5 at 2:15 for f/u on DM, and OUD. Explained importance of keeping this appt.

## 2022-05-16 NOTE — Telephone Encounter (Signed)
Can we have some more information about this request? Suboxone not on med list anymore. Patient canceled follow up appointment on 11/21. Need to see what is going on.

## 2022-05-16 NOTE — Addendum Note (Signed)
Addended by: Lalla Brothers T on: 05/16/2022 11:46 AM   Modules accepted: Orders

## 2022-05-21 ENCOUNTER — Ambulatory Visit (INDEPENDENT_AMBULATORY_CARE_PROVIDER_SITE_OTHER): Payer: Commercial Managed Care - HMO | Admitting: Student

## 2022-05-21 ENCOUNTER — Encounter: Payer: Self-pay | Admitting: Student

## 2022-05-21 VITALS — BP 117/67 | HR 84 | Temp 98.0°F | Ht 68.0 in | Wt 289.3 lb

## 2022-05-21 DIAGNOSIS — Z7984 Long term (current) use of oral hypoglycemic drugs: Secondary | ICD-10-CM

## 2022-05-21 DIAGNOSIS — I10 Essential (primary) hypertension: Secondary | ICD-10-CM | POA: Diagnosis not present

## 2022-05-21 DIAGNOSIS — F1191 Opioid use, unspecified, in remission: Secondary | ICD-10-CM

## 2022-05-21 DIAGNOSIS — M25512 Pain in left shoulder: Secondary | ICD-10-CM

## 2022-05-21 DIAGNOSIS — E119 Type 2 diabetes mellitus without complications: Secondary | ICD-10-CM

## 2022-05-21 DIAGNOSIS — Z87891 Personal history of nicotine dependence: Secondary | ICD-10-CM

## 2022-05-21 MED ORDER — DICLOFENAC SODIUM 1 % EX GEL: 2.0000 g | Freq: Four times a day (QID) | CUTANEOUS | 0 refills | Status: DC

## 2022-05-21 MED ORDER — BUPRENORPHINE HCL-NALOXONE HCL 8-2 MG SL FILM: 1.0000 | ORAL_FILM | Freq: Two times a day (BID) | SUBLINGUAL | 0 refills | Status: DC

## 2022-05-21 NOTE — Patient Instructions (Signed)
Thank you so much for coming to the clinic today! I have given you a month supply of the suboxone, please follow up in a month for the next time. I have also sent voltaren gel for your pain in your shoulder, if you're having continued pain please call and make an appointment and we can re-evaluate.  If you have any questions please feel free to the call the clinic at anytime at [phone]. It was a pleasure seeing you!  Best, Dr. Sanjuana Mae

## 2022-05-22 DIAGNOSIS — M25519 Pain in unspecified shoulder: Secondary | ICD-10-CM | POA: Insufficient documentation

## 2022-05-22 DIAGNOSIS — M25512 Pain in left shoulder: Secondary | ICD-10-CM | POA: Insufficient documentation

## 2022-05-22 NOTE — Assessment & Plan Note (Signed)
Pt complaining of Left shoulder pain for two weeks. On physical exam, limited external and internal rotation seen. Pt denies any trauma to the area, and joint itself does not look erythematous. The pain is a dull, aching pain, and is reproducible by palpation. Likely MSK in nature, and will try Voltaren gel for symptom relief.

## 2022-05-22 NOTE — Assessment & Plan Note (Signed)
Blood pressure within goal at 117/67 today. Current regimen is HCTZ '25mg'$  and Losartan '50mg'$ . She denies any dizziness, chest pain, orthostatic hypotension, or shortness of breath. Will continue this regimen.

## 2022-05-22 NOTE — Assessment & Plan Note (Signed)
Pt was started on metformin '500mg'$  once a day at last visit on 04/23/22. She denies any polyuria or polydipsia. Last A1c obtained at that visit was 6.7. Metformin has been causing diarrhea and nausea. Recommended patient try  to take metformin at night time to help reduce side effect symptoms. If symptoms persist, will need to change to Ozempic which would be a great option for her given body habitus.

## 2022-05-22 NOTE — Assessment & Plan Note (Signed)
Pt presents for refill for suboxone 8-2 BID. 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 She states because she was out of her medicine, and that she has been very stressed about her school, she took half a pill of oxycodone last Wednesday. Was not able to find documentation of the contract and the stipulations, and will need evaluation at next visit to see if she broke her contract. However, she currently denies any cravings.   Plan:  - Refill Suboxone 8-2BID - Next visit, will need thorough contract review

## 2022-05-22 NOTE — Progress Notes (Signed)
CC: OUD Follow up   HPI:  [name] is a 56 y.o. female living with a history stated below and presents today for opioid use disorder medication refill. Please see problem based assessment and plan for additional details.  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HCC)    Chronic pain    Depression    Diabetes ([place])    Hyperlipidemia    Hypertension    PONV (postoperative nausea and vomiting)    Sleep apnea     Current Outpatient Medications on File Prior to Visit  Medication Sig Dispense Refill   atorvastatin (LIPITOR) 20 MG tablet Take 1 tablet (20 mg total) by mouth daily. 90 tablet 3   ciclopirox (PENLAC) 8 % solution Apply topically at bedtime. 6.6 mL 3   cyclobenzaprine (FLEXERIL) 5 MG tablet Take 1 tablet (5 mg total) by mouth 3 (three) times daily as needed for muscle spasms. 30 tablet 3   hydrochlorothiazide (HYDRODIURIL) 25 MG tablet Take 1 tablet (25 mg total) by mouth daily. 90 tablet 3   ketoconazole (NIZORAL) 2 % shampoo Apply topically 2 (two) times a week. 120 mL 3   latanoprost (XALATAN) 0.005 % ophthalmic solution Administer 1 drop to both eyes nightly. 2.5 mL 3   losartan (COZAAR) 50 MG tablet Take 1 tablet (50 mg total) by mouth daily. 60 tablet 5   metFORMIN (GLUCOPHAGE-XR) 500 MG 24 hr tablet Take 1 tablet (500 mg total) by mouth daily with breakfast. 90 tablet 0   Misc. Devices MISC Salicylic acid 2.7% and Undecylenic acid.  Dispense 60 mL.  Please apply to affected nails daily as directed     omeprazole (PRILOSEC) 40 MG capsule Take 1 capsule (40 mg total) by mouth daily. 90 capsule 3   Prenatal Vit-DSS-Fe Fum-FA (PRENATAL 19) tablet Take 1 tablet by mouth daily.     triamcinolone cream (KENALOG) 0.5 % APPLY TOPICALLY 3 TIMES DAILY NOT FOR CHRONIC DAILY USE 30 g 3   ursodiol (ACTIGALL) 300 MG capsule Take one tab twice a day with food BEGINNING 14 DAYS AFTER SURGERY 180 capsule 3   No current  facility-administered medications on file prior to visit.    Family History  Problem Relation Age of Onset   COPD Mother    Alcohol abuse Father    Pancreatic cancer Father    Diabetes Maternal Grandmother    Hyperlipidemia Maternal Grandmother    Hypertension Maternal Grandmother     Social History   Socioeconomic History   Marital status: Single    Spouse name: Not on file   Number of children: 3   Years of education: Not on file   Highest education level: Not on file  Occupational History   Not on file  Tobacco Use   Smoking status: Former    Types: Cigarettes    Quit date: 07/25/2013    Years since quitting: 8.8   Smokeless tobacco: Never  Vaping Use   Vaping Use: Never used  Substance and Sexual Activity   Alcohol use: Yes   Drug use: No   Sexual activity: Yes    Birth control/protection: Surgical  Other Topics Concern   Not on file  Social History Narrative   Not on file   Social Determinants of Health   Financial Resource Strain: Medium Risk (04/23/2022)   Overall Financial Resource Strain (CARDIA)    Difficulty of Paying Living Expenses: Somewhat hard  Food  Insecurity: Food Insecurity Present (04/23/2022)   Hunger Vital Sign    Worried About Running Out of Food in the Last Year: Sometimes true    Ran Out of Food in the Last Year: Sometimes true  Transportation Needs: No Transportation Needs (04/23/2022)   PRAPARE - Hydrologist (Medical): No    Lack of Transportation (Non-Medical): No  Physical Activity: Insufficiently Active (04/23/2022)   Exercise Vital Sign    Days of Exercise per Week: 3 days    Minutes of Exercise per Session: 30 min  Stress: Stress Concern Present (04/23/2022)   [place]    Feeling of Stress : To some extent  Social Connections: Socially Integrated (04/23/2022)   Social Connection and Isolation Panel [NHANES]    Frequency of  Communication with Friends and Family: More than three times a week    Frequency of Social Gatherings with Friends and Family: Once a week    Attends Religious Services: More than 4 times per year    Active Member of [employer] or Organizations: Yes    Attends Archivist Meetings: More than 4 times per year    Marital Status: Living with partner  Intimate Partner Violence: Not At Risk (04/23/2022)   Humiliation, Afraid, Rape, and Kick questionnaire    Fear of Current or Ex-Partner: No    Emotionally Abused: No    Physically Abused: No    Sexually Abused: No    Review of Systems: ROS negative except for what is noted on the assessment and plan.  Vitals:   05/21/22 1411  BP: 117/67  Pulse: 84  Temp: 98 F (36.7 C)  TempSrc: Oral  SpO2: 99%  Weight: 289 lb 4.8 oz (131.2 kg)  Height: '5\' 8"'$  (1.727 m)    Physical Exam: Constitutional: well-appearing obese woman, in no acute distress Cardiovascular: regular rate and rhythm, no m/r/g Pulmonary/Chest: normal work of breathing on room air, lungs clear to auscultation bilaterally MSK: normal bulk and tone, limited ROM of internal and external rotation of L shoulder, non-erythematous, and tender to palpation Skin: warm and dry  Assessment & Plan:   Opioid use disorder in remission Pt presents for refill for suboxone 8-2 BID. 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 She states because she was out of her medicine, and that she has been very stressed about her school, she took half a pill of oxycodone last Wednesday. Was not able to find documentation of the contract and the stipulations, and will need evaluation at next visit to see if she broke her contract. However, she currently denies any cravings.   Plan:  - Refill Suboxone 8-2BID - Next visit, will need thorough contract review   Type 2 diabetes mellitus without complications ([place]) Pt was started on metformin  '500mg'$  once a day at last visit on 04/23/22. She denies any polyuria or polydipsia. Last A1c obtained at that visit was 6.7. Metformin has been causing diarrhea and nausea. Recommended patient try  to take metformin at night time to help reduce side effect symptoms. If symptoms persist, will need to change to Ozempic which would be a great option for her given body habitus.   Essential hypertension Blood pressure within goal at 117/67 today. Current regimen is HCTZ '25mg'$  and Losartan '50mg'$ . She denies any dizziness, chest pain, orthostatic hypotension, or shortness of breath. Will continue this  regimen.   Shoulder pain Pt complaining of Left shoulder pain for two weeks. On physical exam, limited external and internal rotation seen. Pt denies any trauma to the area, and joint itself does not look erythematous. The pain is a dull, aching pain, and is reproducible by palpation. Likely MSK in nature, and will try Voltaren gel for symptom relief.   Patient seen with Dr. Leonard Downing Udell Blasingame, M.D. [facility], PGY-1 Date 05/22/2022 Time 7:56 AM

## 2022-05-23 ENCOUNTER — Telehealth: Payer: Self-pay | Admitting: Internal Medicine

## 2022-05-23 LAB — TOXASSURE SELECT,+ANTIDEPR,UR

## 2022-05-23 NOTE — Telephone Encounter (Signed)
Pt seen on 05/21/2022 and states she was to have a Referral Placed for her Shoulder pain.  Please advise .

## 2022-05-24 ENCOUNTER — Ambulatory Visit (INDEPENDENT_AMBULATORY_CARE_PROVIDER_SITE_OTHER): Payer: Commercial Managed Care - HMO | Admitting: Physician Assistant

## 2022-05-24 ENCOUNTER — Ambulatory Visit (INDEPENDENT_AMBULATORY_CARE_PROVIDER_SITE_OTHER): Payer: Commercial Managed Care - HMO

## 2022-05-24 ENCOUNTER — Encounter: Payer: Self-pay | Admitting: Physician Assistant

## 2022-05-24 ENCOUNTER — Ambulatory Visit: Payer: Commercial Managed Care - HMO | Admitting: Physician Assistant

## 2022-05-24 DIAGNOSIS — M25512 Pain in left shoulder: Secondary | ICD-10-CM

## 2022-05-24 MED ORDER — BUPIVACAINE HCL 0.25 % IJ SOLN
2.0000 mL | INTRAMUSCULAR | Status: AC | PRN
  Administered 2022-05-24: 2 mL via INTRA_ARTICULAR

## 2022-05-24 MED ORDER — METHYLPREDNISOLONE ACETATE 40 MG/ML IJ SUSP
80.0000 mg | INTRAMUSCULAR | Status: AC | PRN
  Administered 2022-05-24: 80 mg via INTRA_ARTICULAR

## 2022-05-24 MED ORDER — LIDOCAINE HCL 1 % IJ SOLN
2.0000 mL | INTRAMUSCULAR | Status: AC | PRN
  Administered 2022-05-24: 2 mL

## 2022-05-24 NOTE — Progress Notes (Signed)
Office Visit Note   Patient: [name]           Date of Birth: 12/12/1965           MRN: [id number] Visit Date: 05/24/2022              Requested by: Lottie Mussel, MD [address], [place],  [name] [address] PCP: Lottie Mussel, MD  No chief complaint on file.     HPI: [name] is a pleasant 56 year old woman with a 47-monthhistory of left shoulder pain.  She is status post right shoulder surgery several years ago which she thinks was for rotator cuff.  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  She is right-hand dominant.  She has had injections in the right side before which have helped her.  Assessment & Plan: Visit Diagnoses:  1. Acute pain of left shoulder   2. Left shoulder pain, unspecified chronicity     Plan: Exam consistent with rotator cuff tendinitis we talked about the natural history of this.  Go forward with an injection today.  If she does not have any improvement could consider an MRI given the chronicity of the problem  Follow-Up Instructions: Return in about 2 weeks (around 06/07/2022).   Ortho Exam  Patient is alert, oriented, no adenopathy, well-dressed, normal affect, normal respiratory effort. Left shoulder she has no erythema no ecchymosis.  She has full forward elevation though painful.  She can internally rotate behind her back though painful.  Her strength is intact.  Pain with external rotation.  She has a positive impingement findings positive empty can test.  She is neurovascularly intact  Imaging: No results found. No images are attached to the encounter.  Labs: Lab Results  Component Value Date   HGBA1C 6.7 (A) 04/23/2022   HGBA1C 6.1 (H) 07/04/2021   HGBA1C 8.5 (A) 02/23/2019   REPTSTATUS 05/21/2013 FINAL 05/19/2013   GRAMSTAIN  10/05/2007    FEW WBC PRESENT, PREDOMINANTLY PMN NO SQUAMOUS EPITHELIAL CELLS SEEN NO ORGANISMS SEEN   CULT   05/19/2013    No Beta Hemolytic Streptococci Isolated Performed at [facility]    Lab Results  Component Value Date   ALBUMIN 4.1 07/04/2021   ALBUMIN 3.6 07/21/2010   ALBUMIN 3.4 (L) 08/14/2009    No results found for: "MG" No results found for: "VD25OH"  No results found for: "PREALBUMIN"    Latest Ref Rng & Units 09/22/2013    4:51 AM 07/21/2010    7:47 PM 08/14/2009    6:58 AM  CBC EXTENDED  WBC 4.0 - 10.5 K/uL 12.8  8.4  9.2   RBC 3.87 - 5.11 MIL/uL 4.37  4.56  4.14   Hemoglobin 12.0 - 15.0 g/dL 13.0  14.0  13.0   HCT 36.0 - 46.0 % 39.7  40.7  38.0   Platelets 150 - 400 K/uL 327  324  342   NEUT# 1.7 - 7.7 K/uL  3.2  4.7   Lymph# 0.7 - 4.0 K/uL  3.8  3.1      There is no height or weight on file to calculate BMI.  Orders:  Orders Placed This Encounter  Procedures  . XR Shoulder Left   No orders of the defined types were placed in this encounter.    Procedures: Large Joint Inj: L subacromial bursa on 05/24/2022 11:40 AM Indications:  diagnostic evaluation and pain Details: 22 G 1.5 in needle, posterior approach  Arthrogram: No  Medications: 2 mL lidocaine 1 %; 80 mg methylPREDNISolone acetate 40 MG/ML; 2 mL bupivacaine 0.25 % Outcome: tolerated well, no immediate complications Procedure, treatment alternatives, risks and benefits explained, specific risks discussed. Consent was given by the patient.    Clinical Data: No additional findings.  ROS:  All other systems negative, except as noted in the HPI. Review of Systems  Objective: Vital Signs: There were no vitals taken for this visit.  Specialty Comments:  No specialty comments available.  PMFS History: Patient Active Problem List   Diagnosis Date Noted  . Pain in left shoulder 05/22/2022  . Heat intolerance 11/28/2021  . Vision changes 11/28/2021  . Carpal tunnel syndrome, right upper limb 09/19/2021  . Osteoarthritis of right shoulder 08/08/2021  . Opioid use disorder in  remission 07/04/2021  . Healthcare maintenance 07/04/2021  . Onychomycosis 12/12/2020  . 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 Seborrheic dermatitis 08/01/2020  . Other hyperlipidemia 10/21/2019  . Sleep apnea, obstructive 10/21/2019  . 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 Mood disorder ([place]) 12/18/2017  . Essential hypertension 11/25/2017  . Type 2 diabetes mellitus without complications ([place]) 43/15/4008  . Severe obesity (BMI >= 40) ([place]) 09/30/2017   Past Medical History:  Diagnosis Date  . Anxiety   . Arthritis   . Aspergillosis ([place])   . Chronic pain   . Depression   . Diabetes ([place])   . Hyperlipidemia   . Hypertension   . PONV (postoperative nausea and vomiting)   . Sleep apnea     Family History  Problem Relation Age of Onset  . COPD Mother   . Alcohol abuse Father   . Pancreatic cancer Father   . Diabetes Maternal Grandmother   . Hyperlipidemia Maternal Grandmother   . Hypertension Maternal Grandmother     Past Surgical History:  Procedure Laterality Date  . ABDOMINAL HYSTERECTOMY  1999  . CARPAL TUNNEL RELEASE Right 10/25/2021   Procedure: RIGHT CARPAL TUNNEL RELEASE;  Surgeon: Mcarthur Rossetti, MD;  Location: [facility];  Service: Orthopedics;  Laterality: Right;  . FRACTURE SURGERY    . LUNG LOBECTOMY  2010   right lower lobe  . ROUX-EN-Y GASTRIC BYPASS  10/30/2020   Social History   Occupational History  . Not on file  Tobacco Use  . Smoking status: Former    Types: Cigarettes    Quit date: 07/25/2013    Years since quitting: 8.8  . Smokeless tobacco: Never  Vaping Use  . Vaping Use: Never used  Substance and Sexual Activity  . Alcohol use: Yes  . Drug use: No  . Sexual activity: Yes    Birth control/protection: Surgical

## 2022-05-28 NOTE — Progress Notes (Signed)
Internal Medicine Clinic Attending  I saw and evaluated the patient.  I personally confirmed the key portions of the history and exam documented by Dr. Nooruddin and I reviewed pertinent patient test results.  The assessment, diagnosis, and plan were formulated together and I agree with the documentation in the resident's note.  

## 2022-06-03 ENCOUNTER — Other Ambulatory Visit: Payer: Self-pay

## 2022-06-03 MED ORDER — KETOCONAZOLE 2 % EX SHAM
MEDICATED_SHAMPOO | CUTANEOUS | 3 refills | Status: DC
Start: 2022-06-03 — End: 2023-02-12

## 2022-06-03 NOTE — Telephone Encounter (Signed)
VALACYCLOVIR

## 2022-06-06 ENCOUNTER — Ambulatory Visit: Payer: Commercial Managed Care - HMO | Admitting: Physician Assistant

## 2022-06-12 ENCOUNTER — Other Ambulatory Visit: Payer: Self-pay | Admitting: Student

## 2022-06-24 ENCOUNTER — Ambulatory Visit
Admission: RE | Admit: 2022-06-24 | Discharge: 2022-06-24 | Disposition: A | Discharge status: Home / Self Care | Payer: Medicaid Other | Source: Ambulatory Visit | Attending: Internal Medicine | Admitting: Internal Medicine

## 2022-06-24 ENCOUNTER — Ambulatory Visit: Payer: Commercial Managed Care - HMO

## 2022-06-24 DIAGNOSIS — Z1231 Encounter for screening mammogram for malignant neoplasm of breast: Secondary | ICD-10-CM | POA: Diagnosis not present

## 2022-06-26 ENCOUNTER — Ambulatory Visit (INDEPENDENT_AMBULATORY_CARE_PROVIDER_SITE_OTHER): Payer: 59 | Admitting: Student

## 2022-06-26 VITALS — BP 124/53 | HR 73 | Temp 97.7°F | Wt 289.6 lb

## 2022-06-26 DIAGNOSIS — E119 Type 2 diabetes mellitus without complications: Secondary | ICD-10-CM

## 2022-06-26 DIAGNOSIS — F1191 Opioid use, unspecified, in remission: Secondary | ICD-10-CM

## 2022-06-26 DIAGNOSIS — Z7984 Long term (current) use of oral hypoglycemic drugs: Secondary | ICD-10-CM | POA: Diagnosis not present

## 2022-06-26 DIAGNOSIS — R69 Illness, unspecified: Secondary | ICD-10-CM | POA: Diagnosis not present

## 2022-06-26 MED ORDER — DULAGLUTIDE 0.75 MG/0.5ML ~~LOC~~ SOAJ: 0.7500 mg | SUBCUTANEOUS | 0 refills | Status: DC

## 2022-06-26 NOTE — Assessment & Plan Note (Signed)
Doing well on as needed Suboxone.  Has very few opioid related cravings.  We talked about recent tox assure result showing presence of cocaine metabolites.  She acknowledges that she has been under a lot of stress recently and turned to cocaine to cope.  I talked about the negative effects of cocaine on her heart.  She has good insight into her substance use disorder and denies further counseling at this time.  Does not need refills on her Suboxone today.

## 2022-06-26 NOTE — Assessment & Plan Note (Addendum)
Still suffering side-effects of of metformin including nausea, vomiting, and diarrhea, which have been ongoing since initiating therapy 2 months ago.  Her diabetes is pretty well-controlled, but with her recent weight gain and intolerability of metformin, I worry that this patient with a history of A1c > 15 will relapse to poorly controlled diabetes again.  We will start dulaglutide today with the dual benefit of better glycemic control and weight loss.

## 2022-06-26 NOTE — Assessment & Plan Note (Signed)
History of Roux-en-Y gastric bypass.  Did very well after the surgery with significant weight loss.  However, reports that she has gained back around 30 pounds over the last year or so.  Reports that during times of stress, especially recently, she tends to turn to junk food.  She has been on liraglutide in the past to good effect.  Today we are going to start dulaglutide 0.75 mg subcutaneously once weekly for diabetes and obesity.  Follow-up in 1 week to assess response to therapy and increase dose to 1.5 mg once weekly if she is doing well on the medicine.

## 2022-06-26 NOTE — Patient Instructions (Addendum)
Today we discussed diabetes and starting dulaglutide (Trulicity).  Inject once per week under the skin.   Return in 4 weeks to assess response to intiation of GLP-1 agonist.   Please call our clinic at [phone] Monday through Friday from 9 am to 4 pm if you have questions or concerns about your health. If after hours or on the weekend, call the main hospital number and ask for the Internal Medicine Resident On-Call. If you need medication refills, please notify your pharmacy one week in advance and they will send [place] a request.   Best, Nani Gasser, [place]

## 2022-06-26 NOTE — Progress Notes (Signed)
Subjective:  [name] is a 57 y.o. who presents to clinic for follow up of diabetes and concerns for side-effects of metformin therapy.  We also discussed overweight and substance use disorder.  Review of Systems  Gastrointestinal:  Positive for diarrhea, nausea and vomiting.    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  Struggling in her job search right now and has turned to junk food and cocaine at times to cope.  Objective:   Vitals:   06/26/22 1334  BP: (!) 124/53  Pulse: 73  Temp: 97.7 F (36.5 C)  TempSrc: Oral  SpO2: 100%  Weight: 289 lb 9.6 oz (131.4 kg)    Physical Exam Constitutional:      General: She is not in acute distress.    Appearance: Normal appearance.  Pulmonary:     Effort: Pulmonary effort is normal.  Lymphadenopathy:     Cervical: No cervical adenopathy.  Skin:    General: Skin is warm and dry.  Neurological:     Mental Status: She is alert. Mental status is at baseline.  Psychiatric:        Mood and Affect: Mood normal.        Behavior: Behavior normal.    Assessment & Plan:  The  primary encounter diagnosis was Type 2 diabetes mellitus without complication, without long-term current use of insulin ([place]). Diagnoses of Severe obesity (BMI >= 40) (HCC) and Opioid use disorder in remission were also pertinent to this visit.  Type 2 diabetes mellitus without complications (HCC) Still suffering side-effects of of metformin including nausea, vomiting, and diarrhea, which have been ongoing since initiating therapy 2 months ago.  Her diabetes is pretty well-controlled, but with her recent weight gain and intolerability of metformin, I worry that this patient with a history of A1c > 15 will relapse to poorly controlled diabetes again.  We will start dulaglutide today with the dual benefit of better glycemic control and weight loss.  Severe obesity (BMI >= 40) (HCC) History of Roux-en-Y gastric bypass.  Did very well after the surgery with significant weight loss.  However, reports that she has gained back around 30 pounds over the last year or so.  Reports that during times of stress, especially recently, she tends to turn to junk food.  She has been on liraglutide in the past to good effect.  Today we are going to start dulaglutide 0.75 mg subcutaneously once weekly for diabetes and obesity.  Follow-up in 1 week to assess response to therapy and increase dose to 1.5 mg once weekly if she is doing well on the medicine.  Opioid use disorder in remission Doing well on as needed  Suboxone.  Has very few opioid related cravings.  We talked about recent tox assure result showing presence of cocaine metabolites.  She acknowledges that she has been under a lot of stress recently and turned to cocaine to cope.  I talked about the negative effects of cocaine on her heart.  She has good insight into her substance use disorder and denies further counseling at this time.  Does not need refills on her Suboxone today.    Return in 4 weeks to assess response to intiation of GLP-1 agonist.  Patient  discussed with Dr. Luna Kitchens MD 06/26/2022, 3:34 PM  Pager: [phone]

## 2022-06-27 NOTE — Progress Notes (Signed)
Internal Medicine Clinic Attending  Case discussed with Dr. McLendon  At the time of the visit.  We reviewed the resident's history and exam and pertinent patient test results.  I agree with the assessment, diagnosis, and plan of care documented in the resident's note.  

## 2022-07-02 ENCOUNTER — Other Ambulatory Visit: Payer: Self-pay | Admitting: Student

## 2022-07-02 NOTE — Progress Notes (Signed)
Patient: [name] Date of Birth: 09-29-1965  Reason for Visit: Follow up History from: Patient Primary Neurologist: Rexene Alberts  ASSESSMENT AND PLAN 57 y.o. year old female   26.  OSA on CPAP -Currently her usage and compliance is poor, however she is motivated to improve -We discussed the importance of using CPAP nightly for greater than 4 hours, I will pull a download in 4 weeks to check on the data -We will plan for 49-monthfollow-up, in 1 month if her compliance has greatly improved we will push out to 1 year follow-up  HISTORY OF PRESENT ILLNESS: Today 07/03/22 Here today for CPAP revisit.  Has not consistently been using her CPAP, due to travel, not sleeping well.  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  She has not been sleeping well, reports depression about finding a new job, instead of sleeping has been eating.  She is motivated to get back on track.  She clearly has more restorative sleep with CPAP use.  ESS 14.  Review of CPAP data 06/03/22-07/02/22 shows usage 12/7 days at 40%, greater than 4 hours 3 days at 10%.  Average usage days used 2 hours and 53 minutes.  Minimum pressure 4, maximum pressure 20.  Leak 34.8, AHI 8.8.  She is using a fullface mask.  HISTORY  [name] a 57year old right-handed woman with an underlying medical history of diabetes, hypertension, arthritis, chronic pain, history of aspergillosis, status post right lower lung lobectomy and obesity with BMI of over 40 (with weight loss achieved after gastric bypass surgery in May 2022), who presents for follow-up consultation of her sleep apnea, after interim sleep testing and re-starting autoPAP therapy. The patient is unaccompanied today.  I first met her at the request of her primary care physician on 09/12/2021, at which time she reported a prior diagnosis of obstructive sleep apnea.  She had not been using her current PAP machine.  She was advised to proceed with reevaluation with a sleep  study.  She had a baseline sleep study on 09/17/2021 which showed a sleep latency of 94 minutes, REM latency 71 minutes, sleep efficiency 48.7%.  She had 15.5% of REM sleep, 14.6% of slow-wave sleep and 68.4% of stage II sleep.  Total AHI was 10.5/h, rising to 31.3/h during REM sleep, average oxygen saturation 96%, nadir 76% with time below 89% saturation of 30 minutes.  She was advised to restart AutoPap therapy based on her REM related desaturations.  She restarted treatment in April 2023.  Today, 12/20/2021: I reviewed her AutoPap compliance data for the past 3 months, during which time usage is only documented for 12 days out of 90 days, starting at 07/18/2018 if with some interruptions.  Average AHI 1.1/h, 95th percentile of AutoPap pressure at 13.6 cm with a Default range of 4-20, leak acceptable with the 95th percentile at 9.7 L/min.  She reports using her machine since her sleep study.  She has not taken her machine into her current DME company, [employer], she did get supplies from them but not this machine, this machine had been issued to her before, she is not sure exactly when.  She has a ResMed AirSense 10 AutoSet machine.  She uses a fullface mask and benefits from treatment.   REVIEW OF SYSTEMS: Out of a complete 14 system review of symptoms, the patient complains only of the following symptoms, and all other reviewed systems are negative.  See HPI  ALLERGIES: No  Known Allergies  HOME MEDICATIONS: Outpatient Medications Prior to Visit  Medication Sig Dispense Refill   Buprenorphine HCl-Naloxone HCl 8-2 MG FILM PLACE 1 FILM UNDER THE TONGUE MORNING AND AT BEDTIME 60 Film 0   atorvastatin (LIPITOR) 20 MG tablet Take 1 tablet (20 mg total) by mouth daily. 90 tablet 3   ciclopirox (PENLAC) 8 % solution Apply topically at bedtime. 6.6 mL 3   cyclobenzaprine (FLEXERIL) 5 MG tablet Take 1 tablet (5 mg total) by mouth 3 (three) times daily as needed for muscle spasms. 30 tablet 3   diclofenac Sodium  (VOLTAREN) 1 % GEL Apply 2 g topically 4 (four) times daily. 2 g 0   Dulaglutide 0.75 MG/0.5ML SOPN Inject 0.75 mg into the skin once a week. 2 mL 0   hydrochlorothiazide (HYDRODIURIL) 25 MG tablet Take 1 tablet (25 mg total) by mouth daily. 90 tablet 3   ketoconazole (NIZORAL) 2 % shampoo Apply topically 2 (two) times a week. 120 mL 3   latanoprost (XALATAN) 0.005 % ophthalmic solution Administer 1 drop to both eyes nightly. 2.5 mL 3   losartan (COZAAR) 50 MG tablet Take 1 tablet (50 mg total) by mouth daily. 60 tablet 5   metFORMIN (GLUCOPHAGE-XR) 500 MG 24 hr tablet Take 1 tablet (500 mg total) by mouth daily with breakfast. 90 tablet 0   Misc. Devices MISC Salicylic acid 2.6% and Undecylenic acid.  Dispense 60 mL.  Please apply to affected nails daily as directed     omeprazole (PRILOSEC) 40 MG capsule Take 1 capsule (40 mg total) by mouth daily. 90 capsule 3   Prenatal Vit-DSS-Fe Fum-FA (PRENATAL 19) tablet Take 1 tablet by mouth daily.     triamcinolone cream (KENALOG) 0.5 % APPLY TOPICALLY 3 TIMES DAILY NOT FOR CHRONIC DAILY USE 30 g 3   ursodiol (ACTIGALL) 300 MG capsule Take one tab twice a day with food BEGINNING 14 DAYS AFTER SURGERY 180 capsule 3   No facility-administered medications prior to visit.    PAST MEDICAL HISTORY: 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[place])    Chronic pain    Depression    Diabetes ([place])    Hyperlipidemia    Hypertension    PONV (postoperative nausea and vomiting)    Sleep apnea     PAST SURGICAL HISTORY: Past Surgical History:  Procedure Laterality Date   ABDOMINAL HYSTERECTOMY  1999   CARPAL TUNNEL RELEASE Right 10/25/2021   Procedure: RIGHT CARPAL TUNNEL RELEASE;  Surgeon: Mcarthur Rossetti, MD;  Location: [facility];  Service: Orthopedics;  Laterality: Right;   FRACTURE SURGERY     LUNG LOBECTOMY  2010   right lower lobe   ROUX-EN-Y GASTRIC BYPASS  10/30/2020    FAMILY  HISTORY: Family History  Problem Relation Age of Onset   COPD Mother    Alcohol abuse Father    Pancreatic cancer Father    Diabetes Maternal Grandmother    Hyperlipidemia Maternal Grandmother    Hypertension Maternal Grandmother     SOCIAL HISTORY: Social History   Socioeconomic History   Marital status: Single    Spouse name: Not on file   Number of children: 3   Years of education: Not on file   Highest education level: Not on file  Occupational History   Not on file  Tobacco Use   Smoking status: Former    Types: Cigarettes    Quit date: 07/25/2013  Years since quitting: 8.9   Smokeless tobacco: Never  Vaping Use   Vaping Use: Never used  Substance and Sexual Activity   Alcohol use: Yes   Drug use: No   Sexual activity: Yes    Birth control/protection: Surgical  Other Topics Concern   Not on file  Social History Narrative   Not on file   Social Determinants of Health   Financial Resource Strain: Medium Risk (04/23/2022)   Overall Financial Resource Strain (CARDIA)    Difficulty of Paying Living Expenses: Somewhat hard  Food Insecurity: Food Insecurity Present (04/23/2022)   Hunger Vital Sign    Worried About Running Out of Food in the Last Year: Sometimes true    Ran Out of Food in the Last Year: Sometimes true  Transportation Needs: No Transportation Needs (04/23/2022)   PRAPARE - Hydrologist (Medical): No    Lack of Transportation (Non-Medical): No  Physical Activity: Insufficiently Active (04/23/2022)   Exercise Vital Sign    Days of Exercise per Week: 3 days    Minutes of Exercise per Session: 30 min  Stress: Stress Concern Present (04/23/2022)   [place]    Feeling of Stress : To some extent  Social Connections: Socially Integrated (04/23/2022)   Social Connection and Isolation Panel [NHANES]    Frequency of Communication with Friends and Family: More than  three times a week    Frequency of Social Gatherings with Friends and Family: Once a week    Attends Religious Services: More than 4 times per year    Active Member of [employer] or Organizations: Yes    Attends Archivist Meetings: More than 4 times per year    Marital Status: Living with partner  Intimate Partner Violence: Not At Risk (04/23/2022)   Humiliation, Afraid, Rape, and Kick questionnaire    Fear of Current or Ex-Partner: No    Emotionally Abused: No    Physically Abused: No    Sexually Abused: No   PHYSICAL EXAM  Vitals:   07/03/22 0754  BP: 129/77  Pulse: 70  Weight: 289 lb (131.1 kg)   Body mass index is 43.94 kg/m.  Generalized: Well developed, in no acute distress  Neurological examination  Mentation: Alert oriented to time, place, history taking. Follows all commands speech and language fluent Cranial nerve II-XII: Pupils were equal round reactive to light. Extraocular movements were full, visual field were full on confrontational test. Facial sensation and strength were normal. Head turning and shoulder shrug  were normal and symmetric. Motor: The motor testing reveals 5 over 5 strength of all 4 extremities. Good symmetric motor tone is noted throughout.  Sensory: Sensory testing is intact to soft touch on all 4 extremities. No evidence of extinction is noted.  Coordination: Cerebellar testing reveals good finger-nose-finger and heel-to-shin bilaterally.  Gait and station: Gait is normal.   DIAGNOSTIC DATA (LABS, IMAGING, TESTING) - I reviewed patient records, labs, notes, testing and imaging myself where available.  Lab Results  Component Value Date   WBC 12.8 (H) 09/22/2013   HGB 13.0 09/22/2013   HCT 39.7 09/22/2013   MCV 90.8 09/22/2013   PLT 327 09/22/2013      Component Value Date/Time   NA 141 11/28/2021 0954   K 4.4 11/28/2021 0954   CL 103 11/28/2021 0954   CO2 25 11/28/2021 0954   GLUCOSE 132 (H) 11/28/2021 0954   GLUCOSE 147 (H)  10/24/2021  1500   BUN 8 11/28/2021 0954   CREATININE 0.66 11/28/2021 0954   CALCIUM 9.4 11/28/2021 0954   PROT 6.9 07/04/2021 1003   ALBUMIN 4.1 07/04/2021 1003   AST 26 07/04/2021 1003   ALT 24 07/04/2021 1003   ALKPHOS 86 07/04/2021 1003   BILITOT 0.4 07/04/2021 1003   GFRNONAA >60 10/24/2021 1500   GFRAA 118 11/25/2017 0959   Lab Results  Component Value Date   CHOL 149 07/04/2021   HDL 62 07/04/2021   LDLCALC 67 07/04/2021   TRIG 114 07/04/2021   CHOLHDL 2.4 07/04/2021   Lab Results  Component Value Date   HGBA1C 6.7 (A) 04/23/2022   No results found for: "VITAMINB12" Lab Results  Component Value Date   TSH 0.451 11/28/2021    Butler Denmark, AGNP-C, DNP 07/03/2022, 8:17 AM [facility] [address], [place], [place] [address] [phone]

## 2022-07-03 ENCOUNTER — Ambulatory Visit (INDEPENDENT_AMBULATORY_CARE_PROVIDER_SITE_OTHER): Payer: 59 | Admitting: Neurology

## 2022-07-03 ENCOUNTER — Encounter: Payer: Self-pay | Admitting: Neurology

## 2022-07-03 VITALS — BP 129/77 | HR 70 | Wt 289.0 lb

## 2022-07-03 DIAGNOSIS — G4733 Obstructive sleep apnea (adult) (pediatric): Secondary | ICD-10-CM

## 2022-07-03 NOTE — Patient Instructions (Signed)
Please use CPAP nightly, need to improve on using every night for > 4 hours I will pull a download in 4 weeks and reach out to you See you back in 6 months

## 2022-07-05 ENCOUNTER — Ambulatory Visit (HOSPITAL_COMMUNITY): Payer: Self-pay

## 2022-07-05 ENCOUNTER — Telehealth: Payer: 59 | Admitting: Family Medicine

## 2022-07-05 ENCOUNTER — Other Ambulatory Visit (HOSPITAL_COMMUNITY): Payer: Self-pay

## 2022-07-05 DIAGNOSIS — B009 Herpesviral infection, unspecified: Secondary | ICD-10-CM

## 2022-07-05 MED ORDER — ACYCLOVIR 400 MG PO TABS: 400.0000 mg | ORAL_TABLET | Freq: Three times a day (TID) | ORAL | 0 refills | Status: AC

## 2022-07-05 MED ORDER — ACYCLOVIR 5 % EX OINT: 1.0000 | TOPICAL_OINTMENT | CUTANEOUS | 0 refills | Status: DC

## 2022-07-05 MED ORDER — DICLOFENAC SODIUM 1 % EX GEL
4.0000 g | Freq: Four times a day (QID) | CUTANEOUS | 3 refills | Status: DC
  Filled 2022-07-05: qty 100, 10d supply, fill #0
  Filled 2022-07-06: qty 100, 12d supply, fill #0
  Filled 2022-07-10 – 2022-08-22 (×2): qty 100, 10d supply, fill #0

## 2022-07-05 NOTE — Patient Instructions (Addendum)
[name], thank you for joining [name], NP for today's virtual visit.  While this provider is not your primary care provider (PCP), if your PCP is located in our provider database this encounter information will be shared with them immediately following your visit.   [facility] account gives you access to today's visit and all your visits, tests, and labs performed at [facility] " click here if you don't have a [facility] account or go to [email or web address]  Consent: (Patient) [name] provided verbal consent for this virtual visit at the beginning of the encounter.  Current Medications:  Current Outpatient Medications:    acyclovir (ZOVIRAX) 400 MG tablet, Take 1 tablet (400 mg total) by mouth 3 (three) times daily for 5 days., Disp: 15 tablet, Rfl: 0   acyclovir ointment (ZOVIRAX) 5 %, Apply 1 Application topically as directed., Disp: 15 g, Rfl: 0   Buprenorphine HCl-Naloxone HCl 8-2 MG FILM, PLACE 1 FILM UNDER THE TONGUE MORNING AND AT BEDTIME, Disp: 60 Film, Rfl: 0   atorvastatin (LIPITOR) 20 MG tablet, Take 1 tablet (20 mg total) by mouth daily., Disp: 90 tablet, Rfl: 3   ciclopirox (PENLAC) 8 % solution, Apply topically at bedtime., Disp: 6.6 mL, Rfl: 3   cyclobenzaprine (FLEXERIL) 5 MG tablet, Take 1 tablet (5 mg total) by mouth 3 (three) times daily as needed for muscle spasms., Disp: 30 tablet, Rfl: 3   diclofenac Sodium (VOLTAREN) 1 % GEL, Apply 4 g topically 4 (four) times daily., Disp: 100 g, Rfl: 3   Dulaglutide 0.75 MG/0.5ML SOPN, Inject 0.75 mg into the skin once a week., Disp: 2 mL, Rfl: 0   hydrochlorothiazide (HYDRODIURIL) 25 MG tablet, Take 1 tablet (25 mg total) by mouth daily., Disp: 90 tablet, Rfl: 3   ketoconazole (NIZORAL) 2 % shampoo, Apply topically 2 (two) times a week., Disp: 120 mL, Rfl: 3   latanoprost (XALATAN) 0.005 % ophthalmic solution, Administer 1 drop to both eyes nightly., Disp: 2.5 mL, Rfl: 3    losartan (COZAAR) 50 MG tablet, Take 1 tablet (50 mg total) by mouth daily., Disp: 60 tablet, Rfl: 5   metFORMIN (GLUCOPHAGE-XR) 500 MG 24 hr tablet, Take 1 tablet (500 mg total) by mouth daily with breakfast., Disp: 90 tablet, Rfl: 0   Misc. Devices MISC, Salicylic acid 7.8% and Undecylenic acid.  Dispense 60 mL.  Please apply to affected nails daily as directed, Disp: , Rfl:    omeprazole (PRILOSEC) 40 MG capsule, Take 1 capsule (40 mg total) by mouth daily., Disp: 90 capsule, Rfl: 3   Prenatal Vit-DSS-Fe Fum-FA (PRENATAL 19) tablet, Take 1 tablet by mouth daily., Disp: , Rfl:    triamcinolone cream (KENALOG) 0.5 %, APPLY TOPICALLY 3 TIMES DAILY NOT FOR CHRONIC DAILY USE, Disp: 30 g, Rfl: 3   ursodiol (ACTIGALL) 300 MG capsule, Take one tab twice a day with food BEGINNING 14 DAYS AFTER SURGERY, Disp: 180 capsule, Rfl: 3   Medications ordered in this encounter:  Meds ordered this encounter  Medications   acyclovir ointment (ZOVIRAX) 5 %    Sig: Apply 1 Application topically as directed.    Dispense:  15 g    Refill:  0    Order Specific Question:   Supervising Provider    Answer:   Chase Picket [[id number]]   acyclovir (ZOVIRAX) 400 MG tablet    Sig: Take 1 tablet (400 mg total) by mouth 3 (three) times daily for 5 days.  Dispense:  15 tablet    Refill:  0    Order Specific Question:   Supervising Provider    Answer:   Chase Picket [id number]     *If you need refills on other medications prior to your next appointment, please contact your pharmacy*  Follow-Up: Call back or seek an in-person evaluation if the symptoms worsen or if the condition fails to improve as anticipated.  [place] [phone]  Other Instructions  Genital Herpes Genital herpes is a common sexually transmitted infection (STI) that is caused by a virus. The virus spreads from person to person through contact with a sore, infected saliva, or infected skin. The virus can cause itching,  blisters, and sores around the genitals or rectum. During an outbreak of infection, symptoms may last for several days and then go away. However, the virus remains in the body, so more outbreaks may happen in the future. The time between outbreaks varies and can be from months to years. Genital herpes can affect anyone. It is particularly concerning for pregnant women because the virus can be passed to the baby during delivery. Genital herpes is also a concern for people who have a weak disease-fighting system (immune system). What are the causes? This condition is caused by the herpes simplex virus, type 1 or type 2 (HSV-1 or HSV-2). The virus may spread through: Sexual contact with an infected person, including vaginal, anal, and oral sex. Contact with a herpes sore. The skin. This means that you can get herpes from an infected partner even if there are no blisters or sores present. Your partner may not know that he or she is infected. What increases the risk? You are more likely to develop this condition if: You have sex with many partners. You do not use latex or polyurethane condoms during sex. What are the signs or symptoms? Most people do not have symptoms or they have mild symptoms that may be mistaken for other skin problems. Symptoms may include: Small, red bumps near the genitals, rectum, or mouth. These bumps turn into blisters and then sores. Flu-like (influenza-like) symptoms, including: Fever. Body aches. Swollen lymph nodes. Headache. Painful urination. Pain and itching in the genital area or rectal area. Vaginal discharge. Tingling or shooting pain in the legs and buttocks. Generally, symptoms are more severe and last longer during the first (primary) outbreak. Influenza-like symptoms are also more common during the primary outbreak. How is this diagnosed? This condition may be diagnosed based on: A physical exam. Your medical history. Blood tests. A test of a fluid  sample (culture) from an open sore. How is this treated? There is no cure for this condition, but treatment with antiviral medicines can do the following: Speed up healing and relieve symptoms. Help to reduce the spread of the virus to sexual partners. Limit the chance of future outbreaks, or make future outbreaks shorter. Lessen symptoms of future outbreaks. Your health care provider may also recommend over-the-counter medicines to help with pain and itching. Follow these instructions at home: If you have an outbreak:  Keep the affected areas dry and clean. Avoid rubbing or touching blisters and sores. If you do touch blisters or sores: Wash your hands thoroughly with soap and water for at least 20 seconds. If soap and water are not available, use an alcohol-based hand sanitizer. Do not touch your eyes afterward. Sexual activity Do not have sexual contact during active outbreaks. Practice safe sex. Herpes can spread even if your  partner does not have blisters or sores. Latex or polyurethane condoms and female condoms may help prevent the spread of the herpes virus. Managing pain and discomfort If directed, put ice on the painful area. To do this: Put ice in a plastic bag. Place a towel between your skin and the bag. Leave the ice on for 20 minutes, 2-3 times a day. Remove the ice if your skin turns bright red. This is very important. If you cannot feel pain, heat, or cold, you have a greater risk of damage to the area. If told, take a cool sitz bath to help relieve pain or itching. A sitz bath is a water bath that you take while sitting down in water that is deep enough to cover your hips and buttocks. General instructions Take over-the-counter and prescription medicines only as told by your health care provider. If you were prescribed an antiviral medicine, use it as told by your health care provider. Do not stop using the antiviral even if you start to feel better. Keep all follow-up  visits. This is important. How is this prevented? Use condoms. Although you can get genital herpes during sexual contact even with the use of a condom, a condom can provide some protection. Avoid having multiple sexual partners. Talk with your sexual partner about any symptoms either of you may have. Also, talk with your partner about any history of STIs. Do not have sexual contact if you have active symptoms of genital herpes. Contact a health care provider if: Your symptoms are not improving with medicine. Your symptoms return, or you have new symptoms. You have a fever. You have abdominal pain. You have redness, swelling, or pain in your eye. You notice new sores on other parts of your body. You have had herpes and you become pregnant or plan to become pregnant. Get help right away if: You have symptoms of viral meningitis. This is rare but may happen if the virus spreads to the brain. Symptoms may include: Severe headache or stiff neck. Muscle aches. Nausea and vomiting. Sensitivity to light. Summary Genital herpes is a common sexually transmitted infection (STI) that is caused by the herpes simplex virus, type 1 or type 2 (HSV-1 or HSV-2). These viruses are most often spread through sexual contact with an infected person. You are more likely to develop this condition if you have sex with many partners or you do not use condoms during sex. Most people do not have symptoms or have mild symptoms that may be mistaken for other skin problems. Symptoms occur as outbreaks that may happen months or years apart. There is no cure for this condition, but treatment with oral antiviral medicines can reduce symptoms, reduce the chance of spreading the virus to a partner, prevent future outbreaks, or shorten future outbreaks. This information is not intended to replace advice given to you by your health care provider. Make sure you discuss any questions you have with your health care  provider. Document Revised: 03/08/2021 Document Reviewed: 03/08/2021 Elsevier Patient Education  [place].    If you have been instructed to have an in-person evaluation today at a local Urgent Care facility, please use the link below. It will take you to a list of all of our available [facility], including address, phone number and hours of operation. Please do not delay care.  [facility]  If you or a family member do not have a primary care provider, use the link below to  schedule a visit and establish care. When you choose a [facility] primary care physician or advanced practice provider, you gain a long-term partner in health. Find a Primary Care Provider  Learn more about [facility]'s in-office and virtual care options: [facility]

## 2022-07-05 NOTE — Progress Notes (Signed)
Virtual Visit Consent   [name], you are scheduled for a virtual visit with a [facility] provider today. Just as with appointments in the office, your consent must be obtained to participate. Your consent will be active for this visit and any virtual visit you may have with one of our providers in the next 365 days. If you have a MyChart account, a copy of this consent can be sent to you electronically.  As this is a virtual visit, video technology does not allow for your provider to perform a traditional examination. This may limit your provider's ability to fully assess your condition. If your provider identifies any concerns that need to be evaluated in person or the need to arrange testing (such as labs, EKG, etc.), we will make arrangements to do so. Although advances in technology are sophisticated, we cannot ensure that it will always work on either your end or our end. If the connection with a video visit is poor, the visit may have to be switched to a telephone visit. With either a video or telephone visit, we are not always able to ensure that we have a secure connection.  By engaging in this virtual visit, you consent to the provision of healthcare and authorize for your insurance to be billed (if applicable) for the services provided during this visit. Depending on your insurance coverage, you may receive a charge related to this service.  I need to obtain your verbal consent now. Are you willing to proceed with your visit today? [name] has provided verbal consent on 07/05/2022 for a virtual visit (video or telephone). [name], NP  Date: 07/05/2022 12:51 PM  Virtual Visit via Video Note   I, [name], connected with  [name]  ([id number], 1966-02-17) on 07/05/22 at  1:00 PM EST by a video-enabled telemedicine application and verified that I am speaking with the correct person using two identifiers.  Location: Patient: Virtual Visit Location Patient:  Home Provider: Virtual Visit Location Provider: Home Office   I discussed the limitations of evaluation and management by telemedicine and the availability of in person appointments. The patient expressed understanding and agreed to proceed.    History of Present Illness: [name] is a 56 y.o. who identifies as a female who was assigned female at birth, and is being seen today for HSV-2 outbreak- that she has finally run out of ointment for. She reports she needs the oral pills to get it under control and uses the ointment to help with topical pain. This is how she has been using and treating it. She denies changes in bladder or bowels, fevers, chills, n/v/d or other STI signs or symptoms  Problems:  Patient Active Problem List   Diagnosis Date Noted   Pain in left shoulder 05/22/2022   Heat intolerance 11/28/2021   Vision changes 11/28/2021   Carpal tunnel syndrome, right upper limb 09/19/2021   Osteoarthritis of right shoulder 08/08/2021   Opioid use disorder in remission 07/04/2021   Healthcare maintenance 07/04/2021   Onychomycosis 12/12/2020   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   Seborrheic dermatitis 08/01/2020   Other hyperlipidemia 10/21/2019   Sleep apnea, obstructive 10/21/2019   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   Mood disorder ([place]) 12/18/2017   Essential hypertension 11/25/2017   Type 2 diabetes mellitus without complications ([place]) 40/01/6760   Severe obesity (BMI >= 40) ([place]) 09/30/2017    Allergies: No Known Allergies Medications:  Current Outpatient Medications:  Buprenorphine HCl-Naloxone HCl 8-2 MG FILM, PLACE 1 FILM UNDER THE TONGUE MORNING AND AT BEDTIME, Disp: 60 Film, Rfl: 0   atorvastatin (LIPITOR) 20 MG tablet, Take 1 tablet (20 mg total) by mouth daily., Disp: 90 tablet, Rfl: 3   ciclopirox (PENLAC) 8 % solution, Apply topically at bedtime., Disp: 6.6 mL, Rfl: 3   cyclobenzaprine (FLEXERIL) 5 MG tablet, Take 1 tablet (5 mg total) by mouth 3  (three) times daily as needed for muscle spasms., Disp: 30 tablet, Rfl: 3   diclofenac Sodium (VOLTAREN) 1 % GEL, Apply 4 g topically 4 (four) times daily., Disp: 100 g, Rfl: 3   Dulaglutide 0.75 MG/0.5ML SOPN, Inject 0.75 mg into the skin once a week., Disp: 2 mL, Rfl: 0   hydrochlorothiazide (HYDRODIURIL) 25 MG tablet, Take 1 tablet (25 mg total) by mouth daily., Disp: 90 tablet, Rfl: 3   ketoconazole (NIZORAL) 2 % shampoo, Apply topically 2 (two) times a week., Disp: 120 mL, Rfl: 3   latanoprost (XALATAN) 0.005 % ophthalmic solution, Administer 1 drop to both eyes nightly., Disp: 2.5 mL, Rfl: 3   losartan (COZAAR) 50 MG tablet, Take 1 tablet (50 mg total) by mouth daily., Disp: 60 tablet, Rfl: 5   metFORMIN (GLUCOPHAGE-XR) 500 MG 24 hr tablet, Take 1 tablet (500 mg total) by mouth daily with breakfast., Disp: 90 tablet, Rfl: 0   Misc. Devices MISC, Salicylic acid 0.3% and Undecylenic acid.  Dispense 60 mL.  Please apply to affected nails daily as directed, Disp: , Rfl:    omeprazole (PRILOSEC) 40 MG capsule, Take 1 capsule (40 mg total) by mouth daily., Disp: 90 capsule, Rfl: 3   Prenatal Vit-DSS-Fe Fum-FA (PRENATAL 19) tablet, Take 1 tablet by mouth daily., Disp: , Rfl:    triamcinolone cream (KENALOG) 0.5 %, APPLY TOPICALLY 3 TIMES DAILY NOT FOR CHRONIC DAILY USE, Disp: 30 g, Rfl: 3   ursodiol (ACTIGALL) 300 MG capsule, Take one tab twice a day with food BEGINNING 14 DAYS AFTER SURGERY, Disp: 180 capsule, Rfl: 3  Observations/Objective: Patient is well-developed, well-nourished in no acute distress.  Resting comfortably  at home.  Head is normocephalic, atraumatic.  No labored breathing.  Speech is clear and coherent with logical content.  Patient is alert and oriented at baseline.    Assessment and Plan:  1. HSV-2 infection  - acyclovir ointment (ZOVIRAX) 5 %; Apply 1 Application topically as directed.  Dispense: 15 g; Refill: 0 - acyclovir (ZOVIRAX) 400 MG tablet; Take 1 tablet  (400 mg total) by mouth 3 (three) times daily for 5 days.  Dispense: 15 tablet; Refill: 0  Use as discussed Follow up with PCP to keep a Rx on hand   Reviewed side effects, risks and benefits of medication.    Patient acknowledged agreement and understanding of the plan.   Past Medical, Surgical, Social History, Allergies, and Medications have been Reviewed.    Follow Up Instructions: I discussed the assessment and treatment plan with the patient. The patient was provided an opportunity to ask questions and all were answered. The patient agreed with the plan and demonstrated an understanding of the instructions.  A copy of instructions were sent to the patient via MyChart unless otherwise noted below.    The patient was advised to call back or seek an in-person evaluation if the symptoms worsen or if the condition fails to improve as anticipated.  Time:  I spent 8 minutes with the patient via telehealth technology discussing the above problems/concerns.  [name], NP

## 2022-07-06 ENCOUNTER — Other Ambulatory Visit (HOSPITAL_COMMUNITY): Payer: Self-pay

## 2022-07-10 ENCOUNTER — Other Ambulatory Visit: Payer: Self-pay

## 2022-07-10 ENCOUNTER — Other Ambulatory Visit (HOSPITAL_COMMUNITY): Payer: Self-pay

## 2022-07-10 MED ORDER — BUPRENORPHINE HCL-NALOXONE HCL 8-2 MG SL FILM: ORAL_FILM | SUBLINGUAL | 0 refills | Status: DC

## 2022-07-11 ENCOUNTER — Other Ambulatory Visit (HOSPITAL_COMMUNITY): Payer: Self-pay

## 2022-07-11 ENCOUNTER — Other Ambulatory Visit: Payer: Self-pay

## 2022-07-16 ENCOUNTER — Other Ambulatory Visit (HOSPITAL_COMMUNITY): Payer: Self-pay

## 2022-07-19 ENCOUNTER — Other Ambulatory Visit: Payer: Self-pay

## 2022-07-24 ENCOUNTER — Ambulatory Visit (INDEPENDENT_AMBULATORY_CARE_PROVIDER_SITE_OTHER): Payer: 59

## 2022-07-24 VITALS — BP 128/71 | HR 85 | Temp 98.2°F | Ht 68.0 in | Wt 286.9 lb

## 2022-07-24 DIAGNOSIS — E119 Type 2 diabetes mellitus without complications: Secondary | ICD-10-CM | POA: Diagnosis not present

## 2022-07-24 DIAGNOSIS — I10 Essential (primary) hypertension: Secondary | ICD-10-CM | POA: Diagnosis not present

## 2022-07-24 DIAGNOSIS — Z794 Long term (current) use of insulin: Secondary | ICD-10-CM | POA: Diagnosis not present

## 2022-07-24 DIAGNOSIS — F1191 Opioid use, unspecified, in remission: Secondary | ICD-10-CM | POA: Diagnosis not present

## 2022-07-24 DIAGNOSIS — F1190 Opioid use, unspecified, uncomplicated: Secondary | ICD-10-CM

## 2022-07-24 DIAGNOSIS — Z7984 Long term (current) use of oral hypoglycemic drugs: Secondary | ICD-10-CM

## 2022-07-24 DIAGNOSIS — R69 Illness, unspecified: Secondary | ICD-10-CM | POA: Diagnosis not present

## 2022-07-24 DIAGNOSIS — Z87891 Personal history of nicotine dependence: Secondary | ICD-10-CM | POA: Diagnosis not present

## 2022-07-24 LAB — POCT GLYCOSYLATED HEMOGLOBIN (HGB A1C): Hemoglobin A1C: 6.5 % — AB (ref 4.0–5.6)

## 2022-07-24 LAB — GLUCOSE, CAPILLARY: Glucose-Capillary: 84 mg/dL (ref 70–99)

## 2022-07-24 MED ORDER — DULAGLUTIDE 1.5 MG/0.5ML ~~LOC~~ SOAJ: 1.5000 mg | SUBCUTANEOUS | 1 refills | Status: DC

## 2022-07-24 MED ORDER — DULAGLUTIDE 0.75 MG/0.5ML ~~LOC~~ SOAJ: 0.7500 mg | SUBCUTANEOUS | 0 refills | Status: DC

## 2022-07-24 NOTE — Patient Instructions (Addendum)
Thank you for coming to see [place] in clinic [name].   Plan: - Please use dulaglutide 0.75 mg once weekly for 1 more week (I sent in prescription for dulaglutide 1.5 mg once weekly to be started after you finish this last week of 0.75 mg weekly)  - Please continue taking metformin 500 mg daily for your diabetes  - Please continue taking suboxone 8-2 mg twice daily as prescribed   - We will get urine from you at your next appointment for your opioid use disorder and to assess your kidney function related to your diabetes    It was very nice to see you, thank you for allowing [place] to be involved in your care.   Please arrive to your next appointment 5-10 minutes before your scheduled appointment time, thank you!

## 2022-07-24 NOTE — Assessment & Plan Note (Signed)
Patient has a history of T2DM. Current medications include metFORMIN 500 mg daily and Dulaglutide 0.75 mg weekly. Dulaglutide was started 4 weeks ago. Patient states that they are compliant with these medications but was confused on how to use the medication for the first week. She has completed 3 weeks of dulaglutide at this point. Patient does not check their blood sugar at home regularly. Patient endorses polyuria but denies polydipsia or fatigue. Patient states that they do visit the ophthalmologist for yearly eye exams. A1c was 6.7% three months ago. A1c today 6.5%.    Plan: - Urine ACR at next visit (declined today) - Continue metFORMIN 500 mg daily and Dulaglutide 0.75 mg weekly for 1 week (increase dose to 1.5 mg weekly in 1 week)

## 2022-07-24 NOTE — Assessment & Plan Note (Signed)
Patient has a history of HTN. Current medications include losartan 50 mg daily and hydrochlorothiazide 25 mg daily. Patient states that they are compliant with these medications. Patient states that they do not check their BP regularly at home. Patient denies HA, lightheadedness, dizziness, CP, or SOB. Initial BP today is 118/54. Repeat BP is 128/71.   Plan: - Continue losartan 50 mg daily and hydrochlorothiazide 25 mg daily

## 2022-07-24 NOTE — Assessment & Plan Note (Signed)
Patient was last seen in clinic for weight loss in 06/2021. Has history of Roux-en-Y gastric bypass with significant weight loss afterwards. Patient was started on dulaglutide 0.75 mg weekly at this visit. Patient reports that they are compliant with this medication. She has completed 3 weeks of dulaglutide at this dose. Patient has lost 3 lbs since last visit.   Plan: - Continue dulaglutide 0.75 mg weekly (increase to 1.5 mg weekly in 1 week)

## 2022-07-24 NOTE — Assessment & Plan Note (Addendum)
Patient has a history of OUD. Patient last seen in clinic for this condition on 1/10. There was discussion of cocaine metabolites on most recent toxassure. Discussed importance of obtaining from cocaine. Patient denies recent cocaine use. Current medications include Buprenorphine HCl-Naloxone HCl 8-2 mg BID. Patient reports that they are compliant with this medication but does not take it daily. Patient reports taking the medication most days especially when she feels increased cravings. Patient reports decreased cravings on this medication. Discussed importance of taking this medication as prescribed and that we can decrease the dose if necessary. Patient understands and agrees to take the medication as prescribed.   Plan: - Toxassure at next visit in 1 month (declined today and does not need refills) - Continue Buprenorphine HCl-Naloxone HCl 8-2 mg BID

## 2022-07-24 NOTE — Progress Notes (Signed)
CC: f/u T2DM  HPI:  [name] is a 57 y.o. female with past medical history of HTN, HLD, T2DM, OSA, OA, OUD, and obesity (s/p roux-en-Y) that presents for f/u of T2DM.   Patient has a history of T2DM. Current medications include metFORMIN 500 mg daily and Dulaglutide 0.75 mg weekly. Dulaglutide was started 4 weeks ago. Patient states that they are compliant with these medications but was confused on how to use the medication for the first week. She has completed 3 weeks of dulaglutide at this point. Patient does not check their blood sugar at home regularly. Patient endorses polyuria but denies polydipsia or fatigue. Patient states that they do visit the ophthalmologist for yearly eye exams.  Patient has a history of HTN. Current medications include losartan 50 mg daily and hydrochlorothiazide 25 mg daily. Patient states that they are compliant with these medications. Patient states that they do not check their BP regularly at home. Patient denies HA, lightheadedness, dizziness, CP, or SOB.   Patient has a history of OUD. Patient last seen in clinic for this condition on 1/10. There was discussion of cocaine metabolites on most recent toxassure. Discussed importance of obtaining from cocaine. Patient denies recent cocaine use. Current medications include Buprenorphine HCl-Naloxone HCl 8-2 mg BID. Patient reports that they are compliant with this medication but does not take it daily. Patient reports taking the medication most days especially when she feels increased cravings. Patient reports decreased cravings on this medication.   Patient was last seen in clinic for weight loss in 06/2021. Has history of Roux-en-Y gastric bypass with significant weight loss afterwards. Patient was started on dulaglutide 0.75 mg weekly at this visit. She has completed 3 weeks of dulaglutide at this dose. Patient has lost 3 lbs since last visit.  Patient would not like the influenza vaccine today.     Allergies as of 07/24/2022   No Known Allergies      Medication List        Accurate as of July 24, 2022  5:04 PM. If you have any questions, ask your nurse or doctor.          acyclovir ointment 5 % Commonly known as: Zovirax Apply 1 Application topically as directed.   atorvastatin 20 MG tablet Commonly known as: LIPITOR Take 1 tablet (20 mg total) by mouth daily.   Buprenorphine HCl-Naloxone HCl 8-2 MG Film PLACE 1 FILM UNDER THE TONGUE MORNING AND AT BEDTIME   ciclopirox 8 % solution Commonly known as: PENLAC Apply topically at bedtime.   cyclobenzaprine 5 MG tablet Commonly known as: FLEXERIL Take 1 tablet (5 mg total) by mouth 3 (three) times daily as needed for muscle spasms.   diclofenac Sodium 1 % Gel Commonly known as: Voltaren Apply 4 g topically 4 (four) times daily.   Dulaglutide 0.75 MG/0.5ML Sopn Inject 0.75 mg into the skin once a week. What changed: Another medication with the same name was added. Make sure you understand how and when to take each. Changed by: Starlyn Skeans, MD   Dulaglutide 1.5 MG/0.5ML Sopn Inject 1.5 mg into the skin once a week. Start taking on: July 31, 2022 What changed: You were already taking a medication with the same name, and this prescription was added. Make sure you understand how and when to take each. Changed by: Starlyn Skeans, MD   hydrochlorothiazide 25 MG tablet Commonly known as: HYDRODIURIL Take 1 tablet (25 mg total) by mouth daily.   ketoconazole 2 %  shampoo Commonly known as: NIZORAL Apply topically 2 (two) times a week.   latanoprost 0.005 % ophthalmic solution Commonly known as: XALATAN Administer 1 drop to both eyes nightly.   losartan 50 MG tablet Commonly known as: Cozaar Take 1 tablet (50 mg total) by mouth daily.   metFORMIN 500 MG 24 hr tablet Commonly known as: GLUCOPHAGE-XR Take 1 tablet (500 mg total) by mouth daily with breakfast.   Misc. Devices Misc Salicylic acid 8.1% and  Undecylenic acid.  Dispense 60 mL.  Please apply to affected nails daily as directed   omeprazole 40 MG capsule Commonly known as: PRILOSEC Take 1 capsule (40 mg total) by mouth daily.   Prenatal 19 tablet Take 1 tablet by mouth daily.   triamcinolone cream 0.5 % Commonly known as: KENALOG APPLY TOPICALLY 3 TIMES DAILY NOT FOR CHRONIC DAILY USE   ursodiol 300 MG capsule Commonly known as: ACTIGALL Take one tab twice a day with food BEGINNING 14 DAYS AFTER SURGERY         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HCC)    Chronic pain    Depression    Diabetes ([place])    Hyperlipidemia    Hypertension    PONV (postoperative nausea and vomiting)    Sleep apnea    Review of Systems:  per HPI.   Physical Exam: Vitals:   07/24/22 1349 07/24/22 1435  BP: (!) 118/54 128/71  Pulse: 95 85  Temp: 98.2 F (36.8 C)   TempSrc: Oral   SpO2: 99%   Weight: 286 lb 14.4 oz (130.1 kg)   Height: '5\' 8"'$  (1.727 m)    Constitutional: Well-developed, well-nourished, appears comfortable  HENT: Normocephalic and atraumatic.  Eyes: EOM are normal. PERRL.  Neck: Normal range of motion.  Cardiovascular: Regular rate, regular rhythm. No murmurs, rubs, or gallops. Normal radial and PT pulses bilaterally. No LE edema.  Pulmonary: Normal respiratory effort. No wheezes, rales, or rhonchi.   Abdominal: Soft. Non-distended. No tenderness. Normal bowel sounds.  Musculoskeletal: Normal range of motion.     Neurological: Alert and oriented to person, place, and time. Non-focal. Skin: warm and dry.    Assessment & Plan:   See Encounters Tab for problem based charting.  Patient seen with Dr. Cain Sieve.

## 2022-07-25 NOTE — Progress Notes (Signed)
Internal Medicine Clinic Attending  Case discussed with Dr.  Alton Revere   At the time of the visit.  We reviewed the resident's history and exam and pertinent patient test results.  I agree with the assessment, diagnosis, and plan of care documented in the resident's note.     At next visit, please follow up with patient regarding how much suboxone she's taking. If she does not need the BID dosing, then we will decrease the dose.

## 2022-07-29 ENCOUNTER — Other Ambulatory Visit: Payer: Self-pay

## 2022-07-29 DIAGNOSIS — E119 Type 2 diabetes mellitus without complications: Secondary | ICD-10-CM

## 2022-07-30 MED ORDER — METFORMIN HCL ER 500 MG PO TB24: 500.0000 mg | ORAL_TABLET | Freq: Every day | ORAL | 3 refills | Status: DC

## 2022-07-31 ENCOUNTER — Other Ambulatory Visit: Payer: Self-pay | Admitting: Family Medicine

## 2022-07-31 ENCOUNTER — Encounter: Payer: Self-pay | Admitting: Neurology

## 2022-07-31 DIAGNOSIS — B009 Herpesviral infection, unspecified: Secondary | ICD-10-CM

## 2022-08-01 MED ORDER — ACYCLOVIR 5 % EX OINT: 1.0000 | TOPICAL_OINTMENT | CUTANEOUS | 0 refills | Status: AC

## 2022-08-03 ENCOUNTER — Other Ambulatory Visit: Payer: Self-pay | Admitting: Internal Medicine

## 2022-08-05 ENCOUNTER — Other Ambulatory Visit: Payer: Self-pay | Admitting: Student

## 2022-08-05 DIAGNOSIS — E119 Type 2 diabetes mellitus without complications: Secondary | ICD-10-CM

## 2022-08-09 ENCOUNTER — Other Ambulatory Visit: Payer: Self-pay

## 2022-08-09 MED ORDER — BUPRENORPHINE HCL-NALOXONE HCL 8-2 MG SL FILM: ORAL_FILM | SUBLINGUAL | 0 refills | Status: DC

## 2022-08-19 ENCOUNTER — Other Ambulatory Visit: Payer: Self-pay

## 2022-08-19 DIAGNOSIS — E119 Type 2 diabetes mellitus without complications: Secondary | ICD-10-CM

## 2022-08-22 ENCOUNTER — Encounter: Payer: Self-pay | Admitting: Radiology

## 2022-08-23 ENCOUNTER — Other Ambulatory Visit (HOSPITAL_COMMUNITY): Payer: Self-pay

## 2022-08-28 ENCOUNTER — Telehealth: Payer: Self-pay

## 2022-08-28 ENCOUNTER — Other Ambulatory Visit: Payer: Self-pay | Admitting: Student

## 2022-08-28 DIAGNOSIS — E119 Type 2 diabetes mellitus without complications: Secondary | ICD-10-CM

## 2022-08-28 MED ORDER — DULAGLUTIDE 1.5 MG/0.5ML ~~LOC~~ SOAJ: 1.5000 mg | SUBCUTANEOUS | 1 refills | Status: DC

## 2022-08-28 NOTE — Telephone Encounter (Signed)
Returned call to patient. States [facility] was out of Trulicity 1.5 mg so [id number] was sent in. States [facility] on [address] has the 1.5 mg. She is requesting Rx for 1.5 be sent to [facility]. She is also wanting other refills. Several have refills at [facility]. She will contact them and ask them to send requests to [facility] for any refills they do not have on file.

## 2022-08-28 NOTE — Telephone Encounter (Signed)
Pt is requesting a call back .[name] He is wanting her trulicity refilled  but  its not clear on which one she is in need of .. I saw were the 0.7 was called in on the 3/4 but she stated that the 1.5 was what was to be sent in

## 2022-08-30 ENCOUNTER — Telehealth: Payer: Self-pay | Admitting: Internal Medicine

## 2022-08-30 NOTE — Telephone Encounter (Signed)
Refill Request  Pt states her medication is on back order per her pharmacy and she needs to know what to take instead.  Dulaglutide Dulaglutide 1.5 MG/0.5ML [facility]  [facility] [phone] - [place], [place] (Ph: [phone])

## 2022-09-03 MED ORDER — SEMAGLUTIDE(0.25 OR 0.5MG/DOS) 2 MG/3ML ~~LOC~~ SOPN: PEN_INJECTOR | SUBCUTANEOUS | 3 refills | Status: AC

## 2022-09-03 NOTE — Telephone Encounter (Signed)
Called patient to discuss her Trulicity.  She was able to get the [facility].75mg  [facility]. [facility] did not have any Trulicity, and will not have it until the end of the month.   She was taking Trulicity 1.5mg  weekly, but is now on the 0.75mg  weekly since that is all they had.  We will try Ozempic, starting with 0.25mg  weekly for 2 weeks, then increasing to 0.5mg  weekly if she tolerates that. If her insurance doesn't cover Ozempic, it looks like they cover Victoza, but I would prefer to avoid a daily injection if possible.

## 2022-09-09 ENCOUNTER — Other Ambulatory Visit: Payer: Self-pay

## 2022-09-09 MED ORDER — BUPRENORPHINE HCL-NALOXONE HCL 8-2 MG SL FILM: ORAL_FILM | SUBLINGUAL | 0 refills | Status: DC

## 2022-09-23 ENCOUNTER — Other Ambulatory Visit: Payer: Self-pay

## 2022-09-23 MED ORDER — LATANOPROST 0.005 % OP SOLN: OPHTHALMIC | 1 refills | Status: DC

## 2022-10-08 ENCOUNTER — Other Ambulatory Visit: Payer: Self-pay | Admitting: Internal Medicine

## 2022-10-11 ENCOUNTER — Other Ambulatory Visit: Payer: Self-pay

## 2022-10-14 MED ORDER — BUPRENORPHINE HCL-NALOXONE HCL 8-2 MG SL FILM: ORAL_FILM | SUBLINGUAL | 0 refills | Status: DC

## 2022-10-15 NOTE — Progress Notes (Unsigned)
[facility]: Clinic Note  Subjective:  History of Present Illness: [name] is a 57 y.o. year old female who presents for routine follow up of her chronic medical conditions. I have seen her before, and she saw Dr. Peterson Lombard on 07/24/22.   She's doing well overall - has some significant life stressors, but her strong faith is a big source of strength. No medical concerns or questions today.  For her OUD in remission, she confirmed that she is taking suboxone twice daily. Utox completed today.    Please refer to Assessment and Plan below for full details in Problem-Based Charting.   Past Medical History:  Patient Active Problem List   Diagnosis Date Noted   Pain in left shoulder 05/22/2022   Heat intolerance 11/28/2021   Vision changes 11/28/2021   Carpal tunnel syndrome, right upper limb 09/19/2021   Osteoarthritis of right shoulder 08/08/2021   Opioid use disorder in remission 07/04/2021   Healthcare maintenance 07/04/2021   Onychomycosis 12/12/2020   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   Seborrheic dermatitis 08/01/2020   Other hyperlipidemia 10/21/2019   Sleep apnea, obstructive 10/21/2019   Mood disorder (HCC) 12/18/2017   Essential hypertension 11/25/2017   Type 2 diabetes mellitus without complications (HCC) 11/24/2017   Severe obesity (BMI >= 40) (HCC) 09/30/2017     Medications:  Current Outpatient Medications:    acyclovir ointment (ZOVIRAX) 5 %, Apply 1 Application topically as directed., Disp: 15 g, Rfl: 0   atorvastatin (LIPITOR) 20 MG tablet, Take 1 tablet (20 mg total) by mouth daily., Disp: 90 tablet, Rfl: 3   Buprenorphine HCl-Naloxone HCl 8-2 MG FILM, PLACE 1 FILM UNDER THE TONGUE MORNING AND AT BEDTIME, Disp: 60 Film, Rfl: 0   ciclopirox (PENLAC) 8 % solution, Apply topically at bedtime., Disp: 6.6 mL, Rfl: 3   cyclobenzaprine (FLEXERIL) 5 MG tablet, TAKE 1 TABLET(5 MG) BY MOUTH THREE TIMES DAILY AS NEEDED FOR MUSCLE SPASMS,  Disp: 30 tablet, Rfl: 3   diclofenac Sodium (VOLTAREN) 1 % GEL, Apply 4 grams topically 4 times daily., Disp: 100 g, Rfl: 3   Dulaglutide 1.5 MG/0.5ML SOPN, Inject 1.5 mg into the skin once a week., Disp: 2 mL, Rfl: 1   hydrochlorothiazide (HYDRODIURIL) 25 MG tablet, Take 1 tablet (25 mg total) by mouth daily., Disp: 90 tablet, Rfl: 3   ketoconazole (NIZORAL) 2 % shampoo, Apply topically 2 (two) times a week., Disp: 120 mL, Rfl: 3   latanoprost (XALATAN) 0.005 % ophthalmic solution, PUT 1 DROP INTO BOTH EYES IN THE EVENING, Disp: 2.5 mL, Rfl: 1   losartan (COZAAR) 50 MG tablet, Take 1 tablet (50 mg total) by mouth daily., Disp: 90 tablet, Rfl: 3   metFORMIN (GLUCOPHAGE-XR) 500 MG 24 hr tablet, Take 1 tablet (500 mg total) by mouth daily with breakfast., Disp: 90 tablet, Rfl: 3   Misc. Devices MISC, Salicylic acid 4.3% and Undecylenic acid.  Dispense 60 mL.  Please apply to affected nails daily as directed, Disp: , Rfl:    omeprazole (PRILOSEC) 40 MG capsule, Take 1 capsule (40 mg total) by mouth daily., Disp: 90 capsule, Rfl: 3   Prenatal Vit-DSS-Fe Fum-FA (PRENATAL 19) tablet, Take 1 tablet by mouth daily., Disp: , Rfl:    triamcinolone cream (KENALOG) 0.5 %, APPLY TOPICALLY 3 TIMES DAILY NOT FOR CHRONIC DAILY USE, Disp: 30 g, Rfl: 3   ursodiol (ACTIGALL) 300 MG capsule, Take one tab twice a day with food BEGINNING 14 DAYS AFTER SURGERY,  Disp: 180 capsule, Rfl: 3   Allergies: No Known Allergies    Objective:   Vitals: Vitals:   10/16/22 1017  BP: 120/74  Pulse: 90  Temp: 98.1 F (36.7 C)  SpO2: 100%    Physical Exam: Physical Exam Constitutional:      Appearance: Normal appearance. She is obese.  Cardiovascular:     Rate and Rhythm: Normal rate and regular rhythm.  Pulmonary:     Effort: Pulmonary effort is normal. No respiratory distress.  Neurological:     Mental Status: She is alert.  Psychiatric:        Mood and Affect: Mood normal.        Behavior: Behavior normal.       Data: Labs, imaging, and micro were reviewed in Epic. Refer to Assessment and Plan below for full details in Problem-Based Charting.  Assessment & Plan:  Severe obesity (BMI >= 40) (HCC) - Patient is swimming regularly now. Continue exercise.  Essential hypertension - Blood pressure is at goal today. Continue HCTZ 25mg  daily & Losartan 50mg  daily   Type 2 diabetes mellitus without complications (HCC) - A1C at goal of 6.5 today - Continue metformin 500mg  daily & dulaglutide 1.5mg  weekly  - foot exam done today - urine MACR ordered today  Opioid use disorder in remission - Tox assure done today - Continue suboxone 8-2mg  BID  Mood disorder (HCC) - Mood is stable. She is not longer on any medicines for mood. Faith is a big source of strength. I offered BHT referral to help cope with life stressors, but she politely declined, does not need this now  Healthcare maintenance - Pap due, she wishes to defer to next visit     Patient will follow up in 3 months.  Pap needed then!  [name], MD

## 2022-10-16 ENCOUNTER — Ambulatory Visit (INDEPENDENT_AMBULATORY_CARE_PROVIDER_SITE_OTHER): Payer: Medicaid Other | Admitting: Internal Medicine

## 2022-10-16 ENCOUNTER — Encounter: Payer: Self-pay | Admitting: Internal Medicine

## 2022-10-16 VITALS — BP 120/74 | HR 90 | Temp 98.1°F | Ht 68.0 in | Wt 283.6 lb

## 2022-10-16 DIAGNOSIS — F39 Unspecified mood [affective] disorder: Secondary | ICD-10-CM | POA: Diagnosis not present

## 2022-10-16 DIAGNOSIS — E119 Type 2 diabetes mellitus without complications: Secondary | ICD-10-CM

## 2022-10-16 DIAGNOSIS — Z794 Long term (current) use of insulin: Secondary | ICD-10-CM

## 2022-10-16 DIAGNOSIS — Z0000 Encounter for general adult medical examination without abnormal findings: Secondary | ICD-10-CM

## 2022-10-16 DIAGNOSIS — I10 Essential (primary) hypertension: Secondary | ICD-10-CM | POA: Diagnosis not present

## 2022-10-16 DIAGNOSIS — F1191 Opioid use, unspecified, in remission: Secondary | ICD-10-CM

## 2022-10-16 DIAGNOSIS — Z6841 Body Mass Index (BMI) 40.0 and over, adult: Secondary | ICD-10-CM

## 2022-10-16 DIAGNOSIS — Z7984 Long term (current) use of oral hypoglycemic drugs: Secondary | ICD-10-CM

## 2022-10-16 MED ORDER — OMEPRAZOLE 40 MG PO CPDR: 40.0000 mg | DELAYED_RELEASE_CAPSULE | Freq: Every day | ORAL | 3 refills | Status: AC

## 2022-10-16 MED ORDER — URSODIOL 300 MG PO CAPS: ORAL_CAPSULE | ORAL | 3 refills | Status: DC

## 2022-10-16 MED ORDER — HYDROCHLOROTHIAZIDE 25 MG PO TABS: 25.0000 mg | ORAL_TABLET | Freq: Every day | ORAL | 3 refills | Status: DC

## 2022-10-16 MED ORDER — LOSARTAN POTASSIUM 50 MG PO TABS: 50.0000 mg | ORAL_TABLET | Freq: Every day | ORAL | 3 refills | Status: DC

## 2022-10-16 MED ORDER — ATORVASTATIN CALCIUM 20 MG PO TABS: 20.0000 mg | ORAL_TABLET | Freq: Every day | ORAL | 3 refills | Status: DC

## 2022-10-16 NOTE — Assessment & Plan Note (Signed)
-   A1C at goal of 6.5 today - Continue metformin 500mg  daily & dulaglutide 1.5mg  weekly  - foot exam done today - urine MACR ordered today

## 2022-10-16 NOTE — Assessment & Plan Note (Signed)
-   Mood is stable. She is not longer on any medicines for mood. Faith is a big source of strength. I offered BHT referral to help cope with life stressors, but she politely declined, does not need this now

## 2022-10-16 NOTE — Assessment & Plan Note (Signed)
-   Blood pressure is at goal today. Continue HCTZ 25mg  daily & Losartan 50mg  daily

## 2022-10-16 NOTE — Assessment & Plan Note (Signed)
-   Patient is swimming regularly now. Continue exercise.

## 2022-10-16 NOTE — Assessment & Plan Note (Signed)
-   Pap due, she wishes to defer to next visit

## 2022-10-16 NOTE — Patient Instructions (Signed)
Dear Ms Hattabaugh,  It was a pleasure seeing you today.  Please continue taking your meds.  We will do your pap next time.   Sincerely, Dr. [name]

## 2022-10-16 NOTE — Assessment & Plan Note (Signed)
-   Tox assure done today - Continue suboxone 8-2mg  BID

## 2022-10-17 LAB — MICROALBUMIN / CREATININE URINE RATIO
Creatinine, Urine: 63.4 mg/dL
Microalb/Creat Ratio: 20 mg/g creat (ref 0–29)
Microalbumin, Urine: 12.8 ug/mL

## 2022-10-20 LAB — TOXASSURE SELECT,+ANTIDEPR,UR

## 2022-10-30 ENCOUNTER — Other Ambulatory Visit: Payer: Self-pay

## 2022-10-30 MED ORDER — LATANOPROST 0.005 % OP SOLN: OPHTHALMIC | 1 refills | Status: DC

## 2022-11-12 ENCOUNTER — Other Ambulatory Visit: Payer: Self-pay

## 2022-11-12 MED ORDER — BUPRENORPHINE HCL-NALOXONE HCL 8-2 MG SL FILM: ORAL_FILM | SUBLINGUAL | 0 refills | Status: DC

## 2022-11-12 NOTE — Telephone Encounter (Signed)
LOV - 10/16/22. Last refill - 10/14/22.

## 2022-11-12 NOTE — Telephone Encounter (Signed)
Pt is requesting her     Buprenorphine HCl-Naloxone HCl 8-2 MG FILM    Sent to      [facility] [phone] - [place], [place] - [address]

## 2022-12-10 ENCOUNTER — Other Ambulatory Visit: Payer: Self-pay

## 2022-12-11 MED ORDER — BUPRENORPHINE HCL-NALOXONE HCL 8-2 MG SL FILM: ORAL_FILM | SUBLINGUAL | 1 refills | Status: DC

## 2022-12-30 ENCOUNTER — Telehealth: Payer: Self-pay

## 2022-12-30 NOTE — Telephone Encounter (Signed)
Called and spoke to pt and instructed them to bring cpap machine to appt and they voiced gratitude and understanding.

## 2023-01-09 ENCOUNTER — Encounter: Payer: Self-pay | Admitting: Neurology

## 2023-01-09 ENCOUNTER — Ambulatory Visit (INDEPENDENT_AMBULATORY_CARE_PROVIDER_SITE_OTHER): Payer: MEDICAID | Admitting: Neurology

## 2023-01-09 VITALS — BP 138/81 | HR 82 | Ht 68.0 in | Wt 286.0 lb

## 2023-01-09 DIAGNOSIS — G4733 Obstructive sleep apnea (adult) (pediatric): Secondary | ICD-10-CM | POA: Diagnosis not present

## 2023-01-09 NOTE — Progress Notes (Signed)
Patient: [name] Date of Birth: 1966/01/07  Reason for Visit: Follow up for CPAP History from: Patient Primary Neurologist: [name]  ASSESSMENT AND PLAN 57 y.o. year old female   1.  OSA on CPAP -Will try to order new CPAP machine, claims hers is several years old. Would like compact machine to make travel easier, she is a truck driver -Work on nightly usage, minimum of 4 hours, she clearly sleeps better when she wears CPAP. We discussed importance of compliance for treatment of OSA as occupation of truck driving. -Follow up in 6 months, sooner if new CPAP machine is issued for initial period  April 2023 PSG: This study demonstrates overall mild obstructive sleep apnea,  severe in REM sleep with a total AHI of 10.5/hour, REM AHI of  31.3/hour, and O2 nadir of 76%   HISTORY OF PRESENT ILLNESS: Today 01/09/23 Still room for improvement with CPAP. 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 She doesn't sleep well, her mind races, worries. With CPAP, she sleeps better. Would like to see if she is eligible for new CPAP machine, a more compact one? Make easier to take with her when she travels. Has a new mask, full face mask. Can't say she has more energy during the day, mostly due to stress. ESS 12. She was late today due to rain. 10/04/22-01/01/23 50/90 days 56%, 15 days > 4 hours 17%. Days used 3 hours 14 mins. 4-20 cm water EPR off. Leak 33.1. AHI 2.9.  06/23/22 SS: Here today for CPAP revisit.  Has not consistently been using her CPAP, due to travel, not sleeping well.  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  She has not been sleeping well, reports depression about finding a new job, instead of sleeping has been eating.  She is motivated to get back on track.  She clearly has more restorative sleep with CPAP use.  ESS 14.  Review of CPAP data 06/03/22-07/02/22 shows usage 12/7 days at 40%, greater than 4 hours 3 days at 10%.  Average usage days used 2 hours  and 53 minutes.  Minimum pressure 4, maximum pressure 20.  Leak 34.8, AHI 8.8.  She is using a fullface mask.  HISTORY  [name] is a 57 year old right-handed woman with an underlying medical history of diabetes, hypertension, arthritis, chronic pain, history of aspergillosis, status post right lower lung lobectomy and obesity with BMI of over 40 (with weight loss achieved after gastric bypass surgery in May 2022), who presents for follow-up consultation of her sleep apnea, after interim sleep testing and re-starting autoPAP therapy. The patient is unaccompanied today.  I first met her at the request of her primary care physician on 09/12/2021, at which time she reported a prior diagnosis of obstructive sleep apnea.  She had not been using her current PAP machine.  She was advised to proceed with reevaluation with a sleep study.  She had a baseline sleep study on 09/17/2021 which showed a sleep latency of 94 minutes, REM latency 71 minutes, sleep efficiency 48.7%.  She had 15.5% of REM sleep, 14.6% of slow-wave sleep and 68.4% of stage II sleep.  Total AHI was 10.5/h, rising to 31.3/h during REM sleep, average oxygen saturation 96%, nadir 76% with time below 89% saturation of 30 minutes.  She was advised to restart AutoPap therapy based on her REM related desaturations.  She restarted treatment in April 2023.  Today, 12/20/2021: I reviewed her  AutoPap compliance data for the past 3 months, during which time usage is only documented for 12 days out of 90 days, starting at 07/18/2018 if with some interruptions.  Average AHI 1.1/h, 95th percentile of AutoPap pressure at 13.6 cm with a Default range of 4-20, leak acceptable with the 95th percentile at 9.7 L/min.  She reports using her machine since her sleep study.  She has not taken her machine into her current DME company, [facility], she did get supplies from them but not this machine, this machine had been issued to her before, she is not sure exactly when.  She has a  ResMed AirSense 10 AutoSet machine.  She uses a fullface mask and benefits from treatment.   REVIEW OF SYSTEMS: Out of a complete 14 system review of symptoms, the patient complains only of the following symptoms, and all other reviewed systems are negative.  See HPI  ALLERGIES: No Known Allergies  HOME MEDICATIONS: Outpatient Medications Prior to Visit  Medication Sig Dispense Refill   acyclovir ointment (ZOVIRAX) 5 % Apply 1 Application topically as directed. 15 g 0   atorvastatin (LIPITOR) 20 MG tablet Take 1 tablet (20 mg total) by mouth daily. 90 tablet 3   Buprenorphine HCl-Naloxone HCl 8-2 MG FILM PLACE 1 FILM UNDER THE TONGUE MORNING AND AT BEDTIME 60 Film 1   ciclopirox (PENLAC) 8 % solution Apply topically at bedtime. 6.6 mL 3   cyclobenzaprine (FLEXERIL) 5 MG tablet TAKE 1 TABLET(5 MG) BY MOUTH THREE TIMES DAILY AS NEEDED FOR MUSCLE SPASMS 30 tablet 3   diclofenac Sodium (VOLTAREN) 1 % GEL Apply 4 grams topically 4 times daily. 100 g 3   Dulaglutide 1.5 MG/0.5ML SOPN Inject 1.5 mg into the skin once a week. 2 mL 1   hydrochlorothiazide (HYDRODIURIL) 25 MG tablet Take 1 tablet (25 mg total) by mouth daily. 90 tablet 3   ketoconazole (NIZORAL) 2 % shampoo Apply topically 2 (two) times a week. 120 mL 3   latanoprost (XALATAN) 0.005 % ophthalmic solution PUT 1 DROP INTO BOTH EYES IN THE EVENING 2.5 mL 1   losartan (COZAAR) 50 MG tablet Take 1 tablet (50 mg total) by mouth daily. 90 tablet 3   metFORMIN (GLUCOPHAGE-XR) 500 MG 24 hr tablet Take 1 tablet (500 mg total) by mouth daily with breakfast. 90 tablet 3   Misc. Devices MISC Salicylic acid 4.3% and Undecylenic acid.  Dispense 60 mL.  Please apply to affected nails daily as directed     omeprazole (PRILOSEC) 40 MG capsule Take 1 capsule (40 mg total) by mouth daily. 90 capsule 3   Prenatal Vit-DSS-Fe Fum-FA (PRENATAL 19) tablet Take 1 tablet by mouth daily.     triamcinolone cream (KENALOG) 0.5 % APPLY TOPICALLY 3 TIMES DAILY NOT  FOR CHRONIC DAILY USE 30 g 3   ursodiol (ACTIGALL) 300 MG capsule Take one tab twice a day with food BEGINNING 14 DAYS AFTER SURGERY 180 capsule 3   No facility-administered medications prior to visit.    PAST MEDICAL HISTORY: 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HCC)    Chronic pain    Depression    Diabetes (HCC)    Hyperlipidemia    Hypertension    PONV (postoperative nausea and vomiting)    Sleep apnea     PAST SURGICAL HISTORY: Past Surgical History:  Procedure Laterality Date   ABDOMINAL HYSTERECTOMY  1999   CARPAL TUNNEL RELEASE Right 10/25/2021  Procedure: RIGHT CARPAL TUNNEL RELEASE;  Surgeon: Kathryne Hitch, MD;  Location: [facility];  Service: Orthopedics;  Laterality: Right;   FRACTURE SURGERY     LUNG LOBECTOMY  2010   right lower lobe   ROUX-EN-Y GASTRIC BYPASS  10/30/2020    FAMILY HISTORY: Family History  Problem Relation Age of Onset   COPD Mother    Alcohol abuse Father    Pancreatic cancer Father    Diabetes Maternal Grandmother    Hyperlipidemia Maternal Grandmother    Hypertension Maternal Grandmother     SOCIAL HISTORY: Social History   Socioeconomic History   Marital status: Single    Spouse name: Not on file   Number of children: 3   Years of education: Not on file   Highest education level: Not on file  Occupational History   Not on file  Tobacco Use   Smoking status: Former    Current packs/day: 0.00    Types: Cigarettes    Quit date: 07/25/2013    Years since quitting: 9.4   Smokeless tobacco: Never  Vaping Use   Vaping status: Never Used  Substance and Sexual Activity   Alcohol use: Yes   Drug use: No   Sexual activity: Yes    Birth control/protection: Surgical  Other Topics Concern   Not on file  Social History Narrative   Not on file   Social Determinants of Health   Financial Resource Strain: Medium Risk (08/29/2022)   Received from [facility],  [facility]   Overall Financial Resource Strain (CARDIA)    Difficulty of Paying Living Expenses: Somewhat hard  Food Insecurity: Food Insecurity Present (08/29/2022)   Received from [facility], [facility]   Hunger Vital Sign    Worried About Running Out of Food in the Last Year: Sometimes true    Ran Out of Food in the Last Year: Sometimes true  Transportation Needs: No Transportation Needs (08/29/2022)   Received from [employer], [facility]   PRAPARE - Transportation    Lack of Transportation (Medical): No    Lack of Transportation (Non-Medical): No  Physical Activity: Insufficiently Active (08/29/2022)   Received from [facility], [facility]   Exercise Vital Sign    Days of Exercise per Week: 4 days    Minutes of Exercise per Session: 20 min  Stress: Stress Concern Present (08/29/2022)   Received from [facility], [facility] - Occupational Stress Questionnaire    Feeling of Stress : To some extent  Social Connections: Moderately Integrated (08/29/2022)   Received from [facility], [facility]   Social Network    How would you rate your social network (family, work, friends)?: Adequate participation with social networks  Intimate Partner Violence: Not At Risk (08/29/2022)   Received from [facility], [facility]   HITS    Over the last 12 months how often did your partner physically hurt you?: 1    Over the last 12 months how often did your partner insult you or talk down to you?: 2    Over the last 12 months how often did your partner threaten you with physical harm?: 1    Over the last 12 months how often did your partner scream or curse at you?: 2   PHYSICAL EXAM  Vitals:   01/09/23 0912  BP: 138/81  Pulse: 82  Weight: 286 lb (129.7 kg)  Height: 5\' 8"  (1.727 m)   Body mass  index is 43.49 kg/m.  Generalized: Well developed, in no acute distress  Neurological examination  Mentation: Alert oriented  to time, place, history taking. Follows all commands speech and language fluent Cranial nerve II-XII: Pupils were equal round reactive to light. Extraocular movements were full, visual field were full on confrontational test. Facial sensation and strength were normal. Head turning and shoulder shrug  were normal and symmetric. Motor: The motor testing reveals 5 over 5 strength of all 4 extremities. Good symmetric motor tone is noted throughout.  Sensory: Sensory testing is intact to soft touch on all 4 extremities. No evidence of extinction is noted.  Gait and station: Gait is normal.   DIAGNOSTIC DATA (LABS, IMAGING, TESTING) - I reviewed patient records, labs, notes, testing and imaging myself where available.  Lab Results  Component Value Date   WBC 12.8 (H) 09/22/2013   HGB 13.0 09/22/2013   HCT 39.7 09/22/2013   MCV 90.8 09/22/2013   PLT 327 09/22/2013      Component Value Date/Time   NA 141 11/28/2021 0954   K 4.4 11/28/2021 0954   CL 103 11/28/2021 0954   CO2 25 11/28/2021 0954   GLUCOSE 132 (H) 11/28/2021 0954   GLUCOSE 147 (H) 10/24/2021 1500   BUN 8 11/28/2021 0954   CREATININE 0.66 11/28/2021 0954   CALCIUM 9.4 11/28/2021 0954   PROT 6.9 07/04/2021 1003   ALBUMIN 4.1 07/04/2021 1003   AST 26 07/04/2021 1003   ALT 24 07/04/2021 1003   ALKPHOS 86 07/04/2021 1003   BILITOT 0.4 07/04/2021 1003   GFRNONAA >60 10/24/2021 1500   GFRAA 118 11/25/2017 0959   Lab Results  Component Value Date   CHOL 149 07/04/2021   HDL 62 07/04/2021   LDLCALC 67 07/04/2021   TRIG 114 07/04/2021   CHOLHDL 2.4 07/04/2021   Lab Results  Component Value Date   HGBA1C 6.5 (A) 07/24/2022   No results found for: "VITAMINB12" Lab Results  Component Value Date   TSH 0.451 11/28/2021    Margie Ege, AGNP-C, DNP 01/09/2023, 9:36 AM [facility] [address] [place], [place] [address] [phone]

## 2023-01-09 NOTE — Patient Instructions (Signed)
Will see if you can get new CPAP  Work on nightly use, for minimum of 4 hours  Follow up in 6 months

## 2023-01-10 ENCOUNTER — Other Ambulatory Visit: Payer: Self-pay

## 2023-01-10 MED ORDER — LATANOPROST 0.005 % OP SOLN: OPHTHALMIC | 1 refills | Status: DC

## 2023-01-10 MED ORDER — BUPRENORPHINE HCL-NALOXONE HCL 8-2 MG SL FILM: ORAL_FILM | SUBLINGUAL | 1 refills | Status: DC

## 2023-01-31 ENCOUNTER — Telehealth: Payer: Self-pay | Admitting: Neurology

## 2023-01-31 NOTE — Telephone Encounter (Signed)
Pt was scheduled for his initial CPAP on (04-17-23) Pt was informed to bring machine and power cord to the appointment.   DME and between dates are :[facility] 02/25/23-04/27/23

## 2023-02-10 NOTE — Progress Notes (Unsigned)
[facility]: Clinic Note  Subjective:  History of Present Illness: [name] is a 57 y.o. year old female who presents for routine follow up of chronic medical conditions. I saw her in May.  She's doing well today. 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  She's had some right wrist pain for 4 days, started after a long and stressful drive. Hasn't tried anything for pain yet.   Otherwise, no concerns.    Please refer to Assessment and Plan below for full details in Problem-Based Charting.   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   Seborrheic dermatitis 08/01/2020   Other hyperlipidemia 10/21/2019   Sleep apnea, obstructive 10/21/2019   Mood disorder (HCC) 12/18/2017   Essential hypertension 11/25/2017   Type 2 diabetes mellitus without complications (HCC) 11/24/2017   Severe obesity (BMI >= 40) (HCC) 09/30/2017      Medications:  Current Outpatient Medications:    Semaglutide, 1 MG/DOSE, 4 MG/3ML SOPN, Inject 1 mg into the skin once a week., Disp: 3 mL, Rfl: 3   acyclovir ointment (ZOVIRAX) 5 %, Apply 1 Application topically as directed., Disp: 15 g, Rfl: 0   atorvastatin (LIPITOR) 20 MG tablet, Take 1 tablet (20 mg total) by mouth daily., Disp: 90 tablet, Rfl: 3   Buprenorphine HCl-Naloxone HCl 8-2 MG FILM, PLACE 1 FILM UNDER THE TONGUE MORNING AND AT BEDTIME, Disp: 60 Film, Rfl: 1   ciclopirox (PENLAC) 8 % solution, Apply topically at bedtime., Disp: 6.6 mL, Rfl: 3   cyclobenzaprine (FLEXERIL) 5 MG tablet,  TAKE 1 TABLET(5 MG) BY MOUTH THREE TIMES DAILY AS NEEDED FOR MUSCLE SPASMS, Disp: 30 tablet, Rfl: 3   diclofenac Sodium (VOLTAREN) 1 % GEL, Apply 4 grams topically 4 times daily., Disp: 100 g, Rfl: 3   hydrochlorothiazide (HYDRODIURIL) 25 MG tablet, Take 1 tablet (25 mg total) by mouth daily., Disp: 90 tablet, Rfl: 3   [START ON 02/13/2023] ketoconazole (NIZORAL) 2 % shampoo, Apply topically 2 (two) times a week., Disp: 120 mL, Rfl: 3   latanoprost (XALATAN) 0.005 % ophthalmic solution, PUT 1 DROP INTO BOTH EYES IN THE EVENING, Disp: 2.5 mL, Rfl: 1   losartan (COZAAR) 50 MG tablet, Take 1 tablet (50 mg total) by mouth daily., Disp: 90 tablet, Rfl: 3   Misc. Devices MISC, Salicylic acid 4.3% and Undecylenic acid.  Dispense 60 mL.  Please apply to affected nails daily as directed, Disp: , Rfl:    omeprazole (PRILOSEC) 40 MG capsule, Take 1 capsule (40 mg total) by mouth daily., Disp: 90 capsule, Rfl: 3   Prenatal Vit-DSS-Fe Fum-FA (PRENATAL 19) tablet, Take 1 tablet by mouth daily., Disp: , Rfl:    triamcinolone cream (KENALOG) 0.5 %, APPLY TOPICALLY 3 TIMES DAILY NOT FOR CHRONIC DAILY USE, Disp: 30 g, Rfl: 3   ursodiol (ACTIGALL) 300 MG capsule, Take one tab twice a day with food BEGINNING 14 DAYS AFTER SURGERY, Disp: 180 capsule, Rfl: 3   Allergies: No Known Allergies     Objective:   Vitals: Vitals:  02/12/23 0858  BP: 137/60  Pulse: 72  Temp: 98 F (36.7 C)  SpO2: 99%     Physical Exam: Physical Exam Constitutional:      Appearance: Normal appearance. She is obese.  Pulmonary:     Effort: Pulmonary effort is normal. No respiratory distress.  Genitourinary:    General: Normal vulva.     Vagina: No vaginal discharge.  Musculoskeletal:     Comments: R wrist without swelling or erythema, normal ROM  Neurological:     Mental Status: She is alert.      Data: Labs, imaging, and micro were reviewed in Epic. Refer to Assessment and Plan below for full details in Problem-Based  Charting.  Assessment & Plan:  Severe obesity (BMI >= 40) (HCC) - She is seeing Judy Pimple PA with [facility] - She did not like the side effects of phentermine, so is not taking this medicine. I have removed it from her medicine list - Increase Ozempic to 1mg  weekly  - Continue lifestyle modifications  Essential hypertension - Blood pressure is at goal today. Continue HCTZ 25mg  daily & Losartan 50mg  daily   Type 2 diabetes mellitus without complications (HCC) - A1C 5.9 today - Stop metformin - increase ozempic to 1mg  weekly - foot exam next time   Healthcare maintenance - Pap done today - She politely declined flu shot   Opioid use disorder in remission - Tox assure appropriate at last visit - Continue suboxone 8-2mg  BID  Acute pain of right wrist - No preceding trauma, exam is normal - Encouraged RICE for now, and call if not improved in 2 weeks. We both think this is related to stressful driving trip she took recently, and I anticipate this will improve        Patient will follow up in 6 months  [name], MD

## 2023-02-12 ENCOUNTER — Encounter: Payer: Self-pay | Admitting: Internal Medicine

## 2023-02-12 ENCOUNTER — Ambulatory Visit (INDEPENDENT_AMBULATORY_CARE_PROVIDER_SITE_OTHER): Payer: MEDICAID | Admitting: Internal Medicine

## 2023-02-12 ENCOUNTER — Other Ambulatory Visit (HOSPITAL_COMMUNITY)
Admission: RE | Admit: 2023-02-12 | Discharge: 2023-02-12 | Disposition: A | Discharge status: Home / Self Care | Payer: MEDICAID | Source: Ambulatory Visit | Attending: Internal Medicine | Admitting: Internal Medicine

## 2023-02-12 VITALS — BP 137/60 | HR 72 | Temp 98.0°F | Ht 68.0 in | Wt 284.4 lb

## 2023-02-12 DIAGNOSIS — Z124 Encounter for screening for malignant neoplasm of cervix: Secondary | ICD-10-CM | POA: Diagnosis not present

## 2023-02-12 DIAGNOSIS — E119 Type 2 diabetes mellitus without complications: Secondary | ICD-10-CM

## 2023-02-12 DIAGNOSIS — Z0000 Encounter for general adult medical examination without abnormal findings: Secondary | ICD-10-CM | POA: Diagnosis present

## 2023-02-12 DIAGNOSIS — M25531 Pain in right wrist: Secondary | ICD-10-CM | POA: Insufficient documentation

## 2023-02-12 DIAGNOSIS — Z7985 Long-term (current) use of injectable non-insulin antidiabetic drugs: Secondary | ICD-10-CM

## 2023-02-12 DIAGNOSIS — Z87891 Personal history of nicotine dependence: Secondary | ICD-10-CM

## 2023-02-12 DIAGNOSIS — I10 Essential (primary) hypertension: Secondary | ICD-10-CM | POA: Diagnosis not present

## 2023-02-12 DIAGNOSIS — Z6841 Body Mass Index (BMI) 40.0 and over, adult: Secondary | ICD-10-CM

## 2023-02-12 DIAGNOSIS — F1191 Opioid use, unspecified, in remission: Secondary | ICD-10-CM

## 2023-02-12 LAB — POCT GLYCOSYLATED HEMOGLOBIN (HGB A1C): Hemoglobin A1C: 5.9 % — AB (ref 4.0–5.6)

## 2023-02-12 LAB — GLUCOSE, CAPILLARY: Glucose-Capillary: 145 mg/dL — ABNORMAL HIGH (ref 70–99)

## 2023-02-12 MED ORDER — DICLOFENAC SODIUM 1 % EX GEL: 4.0000 g | Freq: Four times a day (QID) | CUTANEOUS | 3 refills | Status: AC

## 2023-02-12 MED ORDER — KETOCONAZOLE 2 % EX SHAM: MEDICATED_SHAMPOO | CUTANEOUS | 3 refills | Status: DC

## 2023-02-12 MED ORDER — HYDROCHLOROTHIAZIDE 25 MG PO TABS: 25.0000 mg | ORAL_TABLET | Freq: Every day | ORAL | 3 refills | Status: AC

## 2023-02-12 MED ORDER — METFORMIN HCL ER 500 MG PO TB24
500.0000 mg | ORAL_TABLET | Freq: Every day | ORAL | 3 refills | Status: DC
Start: 2023-02-12 — End: 2023-02-12

## 2023-02-12 MED ORDER — SEMAGLUTIDE (1 MG/DOSE) 4 MG/3ML ~~LOC~~ SOPN: 1.0000 mg | PEN_INJECTOR | SUBCUTANEOUS | 3 refills | Status: DC

## 2023-02-12 MED ORDER — BUPRENORPHINE HCL-NALOXONE HCL 8-2 MG SL FILM: ORAL_FILM | SUBLINGUAL | 1 refills | Status: DC

## 2023-02-12 MED ORDER — TRIAMCINOLONE ACETONIDE 0.5 % EX CREA: TOPICAL_CREAM | CUTANEOUS | 3 refills | Status: DC

## 2023-02-12 MED ORDER — ATORVASTATIN CALCIUM 20 MG PO TABS: 20.0000 mg | ORAL_TABLET | Freq: Every day | ORAL | 3 refills | Status: AC

## 2023-02-12 NOTE — Assessment & Plan Note (Signed)
-   Pap done today - She politely declined flu shot

## 2023-02-12 NOTE — Patient Instructions (Addendum)
Dear Ms Mork,  It was a pleasure seeing you today.  I have refilled your medicines. Your A1C looks great! Please stop taking Metformin. I have increased your Ozempic to 1mg  weekly - you'll need to pick up a new pen from your pharmacy.  I'll call you with the results of your pap.  I'll see you in 6 months  Sincerely, Dr. [name]

## 2023-02-12 NOTE — Assessment & Plan Note (Signed)
-   Tox assure appropriate at last visit - Continue suboxone 8-2mg  BID

## 2023-02-12 NOTE — Assessment & Plan Note (Signed)
-   She is seeing Judy Pimple PA with [facility] - She did not like the side effects of phentermine, so is not taking this medicine. I have removed it from her medicine list - Increase Ozempic to 1mg  weekly  - Continue lifestyle modifications

## 2023-02-12 NOTE — Assessment & Plan Note (Signed)
-   A1C 5.9 today - Stop metformin - increase ozempic to 1mg  weekly - foot exam next time

## 2023-02-12 NOTE — Assessment & Plan Note (Signed)
-   Blood pressure is at goal today. Continue HCTZ 25mg  daily & Losartan 50mg  daily

## 2023-02-12 NOTE — Assessment & Plan Note (Signed)
-   No preceding trauma, exam is normal - Encouraged RICE for now, and call if not improved in 2 weeks. We both think this is related to stressful driving trip she took recently, and I anticipate this will improve

## 2023-02-18 LAB — RENAL FUNCTION PANEL
Albumin: 3.8 g/dL (ref 3.8–4.9)
BUN/Creatinine Ratio: 8 — ABNORMAL LOW (ref 9–23)
BUN: 5 mg/dL — ABNORMAL LOW (ref 6–24)
CO2: 27 mmol/L (ref 20–29)
Calcium: 9.8 mg/dL (ref 8.7–10.2)
Chloride: 101 mmol/L (ref 96–106)
Creatinine, Ser: 0.66 mg/dL (ref 0.57–1.00)
Glucose: 122 mg/dL — ABNORMAL HIGH (ref 70–99)
Phosphorus: 4 mg/dL (ref 3.0–4.3)
Potassium: 4.3 mmol/L (ref 3.5–5.2)
Sodium: 138 mmol/L (ref 134–144)
eGFR: 104 mL/min/{1.73_m2} (ref >59)

## 2023-02-20 LAB — CYTOLOGY - PAP
Comment: NEGATIVE
Diagnosis: NEGATIVE
High risk HPV: NEGATIVE

## 2023-03-09 NOTE — Progress Notes (Signed)
Internal Medicine Clinic Attending  Case, documentation, and findings reviewed. I agree with the assessment, diagnosis, and plan of care as outlined in the AWV note.

## 2023-03-11 ENCOUNTER — Telehealth: Payer: Self-pay | Admitting: *Deleted

## 2023-03-11 NOTE — Telephone Encounter (Signed)
Call to patient message left to come in for appointment at 8:45 AM on tomorrow morning 03/12/2023 .Patient was asked to call and confirm  receipt of message.

## 2023-03-11 NOTE — Telephone Encounter (Signed)
Call to patient is still having pain in her hand.  Continues is painful.  Unable to pick up anything with her right hand.  Very sensitive to pain.  Would like to get a referral. Has been using gloves for compression.  Has also tried Voltaren, Icy Hot and other OTC roll on. 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

## 2023-03-12 ENCOUNTER — Ambulatory Visit (INDEPENDENT_AMBULATORY_CARE_PROVIDER_SITE_OTHER): Payer: MEDICAID | Admitting: Internal Medicine

## 2023-03-12 ENCOUNTER — Ambulatory Visit (HOSPITAL_COMMUNITY)
Admission: RE | Admit: 2023-03-12 | Discharge: 2023-03-12 | Disposition: A | Discharge status: Home / Self Care | Payer: MEDICAID | Source: Ambulatory Visit | Attending: Internal Medicine | Admitting: Internal Medicine

## 2023-03-12 DIAGNOSIS — M25531 Pain in right wrist: Secondary | ICD-10-CM | POA: Insufficient documentation

## 2023-03-12 NOTE — Progress Notes (Signed)
     [facility] Internal Medicine Residency Telephone Encounter Continuity Care Appointment  HPI:  This telephone encounter was created for Denise. [name] on 03/12/2023 for the following purpose/cc : right thumb pain.  I saw [name] on 8/28 and at that time, she was having 4 days of acute right thumb pain. This happened after a long and tough drive, no preceding trauma, so we did a trial of conservative measures. She is calling back because pain has gotten worse. We tried to do an in-person visit, but converted to telemedicine.  She has a severe, sharp, burning pain at the base of her right thumb. Pain is worse with any hand movement, especially thumb movement. No associated numbness. She has had associated weakness, could barely pick up a pot 2/2 pain. No trauma. She has tried several topical treatments and wearing a splint without improvement. It feels different to her past carpal tunnel pain, for which she had surgery with Ortho Dr. Magnus Ivan.    Past Medical History:  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HCC)    Chronic pain    Depression    Diabetes (HCC)    Hyperlipidemia    Hypertension    PONV (postoperative nausea and vomiting)    Sleep apnea      ROS:     Assessment / Plan / Recommendations:  Please see A&P under problem oriented charting for assessment of the patient's acute and chronic medical conditions.  As always, pt is advised that if symptoms worsen or new symptoms arise, they should go to an urgent care facility or to to ER for further evaluation.   Consent and Medical Decision Making:  This is a telephone encounter between [employer] and [name] on 03/12/2023 for right thumb pain. The visit was conducted with the patient located at home and [name] at [facility]. The patient's identity was confirmed using their DOB and current address. The patient has consented to being evaluated through a telephone encounter  and understands the associated risks (an examination cannot be done and the patient may need to come in for an appointment) / benefits (allows the patient to remain at home, decreasing exposure to coronavirus). I personally spent 8 minutes on medical discussion.

## 2023-03-12 NOTE — Assessment & Plan Note (Signed)
-   will obtain x-rays of right wrist & hand today at [facility] - Continue splint & topical treatments for now - will discuss other options for next steps pending x-ray results

## 2023-03-12 NOTE — Progress Notes (Signed)
Called patient back to discuss her pain, as x-ray reads are not back yet. First call went to voicemail, so I left a message instructing her to call clinic. Second call went through, but she could not hear me.   Here is my plan: - I will call patient back tomorrow once I have the x-ray reads - If no fracture, I think the pain sounds consistent with de Quervain tendinopathy, though it's hard to be certain through a telephone appointment. If no fracture, I recommend 2 week course of NSAIDS, which I will send to her pharmacy. I also recommend the wrist splint to immobilize her thumb. If pain persists, I recommend that she call her Hand Surgeon to see if he could do a steroid injection.

## 2023-03-13 ENCOUNTER — Telehealth: Payer: Self-pay

## 2023-03-13 ENCOUNTER — Other Ambulatory Visit: Payer: Self-pay | Admitting: Internal Medicine

## 2023-03-13 MED ORDER — NAPROXEN 500 MG PO TABS: 500.0000 mg | ORAL_TABLET | Freq: Two times a day (BID) | ORAL | 0 refills | Status: AC

## 2023-03-13 NOTE — Progress Notes (Signed)
Called patient to discuss plan. Radiologist has not read xrays yet, but no fracture on my initial read.   I think the pain sounds consistent with de Quervain tendinopathy, though it's hard to be certain through a telephone appointment.   I recommended 2 week course of NSAIDS, which I sent to pharmacy. I also recommended that she pick up a wrist splint that immobilizes her thumb.  If pain persists after 2 weeks, I recommended evaluation with in person appointment with [place] or Hand Surgeon, who may be able to do steroid injection if this is de Public relations account executive tendinopathy

## 2023-03-13 NOTE — Telephone Encounter (Signed)
Patient called she is requesting a call back regarding her xray results.

## 2023-03-24 ENCOUNTER — Ambulatory Visit: Payer: MEDICAID | Admitting: Orthopaedic Surgery

## 2023-03-31 ENCOUNTER — Ambulatory Visit: Payer: MEDICAID | Admitting: Orthopaedic Surgery

## 2023-03-31 ENCOUNTER — Encounter: Payer: Self-pay | Admitting: Orthopaedic Surgery

## 2023-03-31 DIAGNOSIS — M79641 Pain in right hand: Secondary | ICD-10-CM | POA: Diagnosis not present

## 2023-03-31 MED ORDER — CELECOXIB 200 MG PO CAPS: 200.0000 mg | ORAL_CAPSULE | Freq: Two times a day (BID) | ORAL | 1 refills | Status: DC | PRN

## 2023-03-31 NOTE — Addendum Note (Signed)
Addended by: Mardene Celeste B on: 03/31/2023 02:40 PM   Modules accepted: Orders

## 2023-03-31 NOTE — Progress Notes (Signed)
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  Recently she has had tenderness and pain on the dorsal aspect of her right hand and wrist as well as her thumb.  She denies any specific injuries but she does work quite a bit and drive quite a bit.  She is to be a diabetic but she is not anymore.  She tries to limit anti-inflammatories due to the detrimental effects that has on her stomach lining.  She denies any numbness and tingling.  She does have a lot of sensitivity to pain on the dorsum of her wrist and the thumb with ulnar deviation of the wrist but also flexion of the wrist.  There is no swelling that I can see.  There is no redness.  Likely this is a severe tendinitis of the tendons.  X-rays on the canopy system of her hand and wrist were negative.  I was able to review these and I did not see any injuries or significant arthritic changes at all.  This is likely a severe tendinitis and inflammation.  I would like to put her on Celebrex as an anti-inflammatory due to his GI protective effects.  Will also try a thumb spica splint to take on and off as comfort allows.  I would like to send her to outpatient hand therapy for any modalities that can help decrease her pain.  We would then see her back in 4 weeks to see how she is doing overall.  She agrees with the treatment plan.

## 2023-04-01 ENCOUNTER — Telehealth: Payer: Self-pay | Admitting: *Deleted

## 2023-04-01 NOTE — Telephone Encounter (Signed)
Call from patient states has been having some vaginal itching.  Not a lot of discharge, but has an odor.  No available appointments at this time.  Will see if something comes open today while she has transportation.  Will be willing to go to Urgent Care if nothing becomes available.

## 2023-04-02 ENCOUNTER — Encounter: Payer: MEDICAID | Admitting: Student

## 2023-04-02 ENCOUNTER — Telehealth: Payer: Self-pay

## 2023-04-02 NOTE — Telephone Encounter (Signed)
Prior Authorization for patient (Ozempic 1 MG) came through on cover my meds was submitted with last office notes and labs awaiting approval or denial.  KEY:[id number]

## 2023-04-02 NOTE — Telephone Encounter (Signed)
Decision:Approved  [name] (Key: [id number]) Ozempic (1 MG/DOSE) 4MG /3ML pen-injectors Form [employer] Electronic Prior Authorization Form Created Message from Plan Approved. OZEMPIC (1 MG/DOSE) Soln Pen-inj 4MG /3ML is approved from 03/28/2023 to 04/01/2024. All strengths of the drug are approved.. Authorization Expiration Date: April 01, 2024.

## 2023-04-04 ENCOUNTER — Other Ambulatory Visit: Payer: Self-pay

## 2023-04-04 ENCOUNTER — Ambulatory Visit: Payer: MEDICAID | Attending: Orthopaedic Surgery | Admitting: Occupational Therapy

## 2023-04-04 ENCOUNTER — Encounter: Payer: Self-pay | Admitting: Occupational Therapy

## 2023-04-04 DIAGNOSIS — G5601 Carpal tunnel syndrome, right upper limb: Secondary | ICD-10-CM

## 2023-04-04 DIAGNOSIS — M6281 Muscle weakness (generalized): Secondary | ICD-10-CM | POA: Diagnosis present

## 2023-04-04 DIAGNOSIS — Z9889 Other specified postprocedural states: Secondary | ICD-10-CM

## 2023-04-04 DIAGNOSIS — M79641 Pain in right hand: Secondary | ICD-10-CM | POA: Insufficient documentation

## 2023-04-04 DIAGNOSIS — R208 Other disturbances of skin sensation: Secondary | ICD-10-CM | POA: Insufficient documentation

## 2023-04-04 DIAGNOSIS — R200 Anesthesia of skin: Secondary | ICD-10-CM

## 2023-04-04 DIAGNOSIS — R278 Other lack of coordination: Secondary | ICD-10-CM | POA: Diagnosis present

## 2023-04-04 NOTE — Therapy (Signed)
OUTPATIENT OCCUPATIONAL THERAPY ORTHO EVALUATION  Patient Name: [name] MRN: [id number] DOB:February 06, 1966, 57 y.o., female Today's Date: 04/04/2023  PCP: [name], MD  REFERRING PROVIDER: Kathryne Hitch, MD   END OF SESSION:  OT End of Session - 04/04/23 1244     Visit Number 1    Number of Visits 6    Date for OT Re-Evaluation 05/30/23    Authorization Type [employer] - Auth Required, VL 27    OT Start Time [phone]    OT Stop Time 0802    OT Time Calculation (min) 39 min    Activity Tolerance Patient tolerated treatment well    Behavior During Therapy WFL for tasks assessed/performed             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HCC)    Chronic pain    Depression    Diabetes (HCC)    Hyperlipidemia    Hypertension    PONV (postoperative nausea and vomiting)    Sleep apnea    Past Surgical History:  Procedure Laterality Date   ABDOMINAL HYSTERECTOMY  1999   CARPAL TUNNEL RELEASE Right 10/25/2021   Procedure: RIGHT CARPAL TUNNEL RELEASE;  Surgeon: Kathryne Hitch, MD;  Location: [facility];  Service: Orthopedics;  Laterality: Right;   FRACTURE SURGERY     LUNG LOBECTOMY  2010   right lower lobe   ROUX-EN-Y GASTRIC BYPASS  10/30/2020   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   Seborrheic dermatitis 08/01/2020   Other hyperlipidemia 10/21/2019   Sleep apnea, obstructive 10/21/2019   Mood disorder (HCC) 12/18/2017   Essential hypertension 11/25/2017   Type 2 diabetes  mellitus without complications (HCC) 11/24/2017   Severe obesity (BMI >= 40) (HCC) 09/30/2017    ONSET DATE: 03/31/23 (referral date), 04/04/23 (updated referral date)  REFERRING DIAG:  M79.641 (ICD-10-CM) - Pain in right hand  Z98.890 (ICD-10-CM) - Status post carpal tunnel release  R20.0 (ICD-10-CM) - Numbness of left hand  R20.0 (ICD-10-CM) - Numbness of right hand  G56.01 (ICD-10-CM) - Carpal tunnel syndrome, right upper limb    THERAPY DIAG:  Muscle weakness (generalized)  Other lack of coordination  Other disturbances of skin sensation  Pain in right hand  Rationale for Evaluation and Treatment: Rehabilitation  SUBJECTIVE:   SUBJECTIVE STATEMENT: Pt wearing thumb spica splint on R hand. Pt reports sharp pain of R hand beginning second week of August. Pt reports it was "more thumb" at first and then spread to back of hand. Pt has been completing RICE treatment at home to reduce pain. Pt reports X-ray showed no fractures. Pt reports having carpal tunnel surgery last year (2023). Pt taking Celebrix, which is helping to alleviate pain. Pt reports wearing brace since Monday 03/31/23. Pt reports R hand seems to be getting weak.  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 Recently she has had tenderness and pain on the dorsal aspect of her right hand and wrist as well as her thumb. She denies any specific injuries but she does work quite a bit and drive quite a bit. She is to be a diabetic but she is not anymore. She tries to limit anti-inflammatories due to the detrimental effects that has on her stomach lining. She denies any numbness and tingling. She does have a lot of sensitivity to pain on the dorsum of her wrist and the  thumb with ulnar deviation of the wrist but also flexion of the wrist. There is no swelling that I can see. There is no redness. Likely this is a severe tendinitis of the tendons... Will also try a thumb spica splint to take on and off as comfort allows. I would like to send her to outpatient hand therapy for any modalities that can help decrease her pain."   PRECAUTIONS: None  RED FLAGS: None   WEIGHT BEARING RESTRICTIONS: No  PAIN:  Are you having pain? Yes: NPRS scale: 5/10 Pain location: back of hand, along dorsal thumb Pain description: aching Aggravating factors: using the hand Relieving factors: guarding, using L hand instead  FALLS: Has patient fallen in last 6 months? No  LIVING ENVIRONMENT: Lives with: lives with their family and lives with their daughter Lives in: House/apartment Stairs: Yes: External: 2 steps; on right going up Has following equipment at home: Retail banker - 4 wheeled - pt uses these devices as needed d/t knee/ankle pain, but not currently using them  PLOF: Independent, drives a truck for work  PATIENT GOALS: "to alleviate the discomfort/pain"  NEXT MD VISIT: pt reports "not sure" but stated intent to call MD office to confirm  OBJECTIVE:  Note: Objective measures were completed at Evaluation unless otherwise noted.  HAND DOMINANCE: Right  ADLs: Overall ADLs:  Transfers/ambulation related to ADLs: Eating: minimal difficulty Grooming: difficulty with brushing teeth d/t dominant hand affected, completes task UB Dressing: ind, does not generally have buttons, has not attempted zippers LB Dressing: minimal difficulty - wears slide-on shoes, pants "a little" difficult to complete Toileting: difficulty with wiping, ind though "not comfortably" Bathing: no difficulty Tub Shower transfers: ind Equipment: none  Cooking: Pt cooks with cast iron pan and reports difficulty with carrying the pan.  Handwriting: Pt reports increased  difficulty with writing.  FUNCTIONAL OUTCOME MEASURES: Upper Extremity Functional Scale (UEFS): 36.4 points    UPPER EXTREMITY ROM:     Active ROM Right eval Left eval  Shoulder flexion [facility]  Shoulder abduction    Shoulder adduction    Shoulder extension    Shoulder internal rotation    Shoulder external rotation    Elbow flexion    Elbow extension        Wrist extension 25* 29*  Wrist flexion 51* 65*  Wrist radial deviation Some limitation, pt reports d/t pain WFL  Wrist ulnar deviation Some limitation, pt reports d/t pain WFL  Wrist supination [facility] WFL  Wrist pronation WFL WFL  (Blank rows = not tested)  Pt reports some discomfort with wrist ext/flex.  Active ROM Right eval Left eval  Thumb MCP (0-60) WFL                                     Thumb IP (0-80)   Thumb  Radial abd/add (0-55)   Thumb Palmar abd/add (0-45)   Thumb Opposition to Small Finger   Index MCP (0-90)   Index PIP (0-100)   Index DIP (0-70)   Long MCP (0-90)   Long PIP (0-100)   Long DIP (0-70)   Ring MCP (0-90)   Ring PIP (0-100)   Ring DIP (0-70)   Little MCP (0-90)   Little PIP (0-100)   Little DIP (0-70)   (Blank rows = not tested)  Note: Pt reports pain at dorsal thumb and dorsal wrist when releasing composite fist.  HAND FUNCTION: Grip strength: Right: 29.9 lbs; Left: 60.1 lbs  COORDINATION: 9 Hole Peg test: Right: 29 sec; Left: 28 sec  Note: OT noted pt appeared guarded and avoided extra movements of RUE when possible. Pt reports using guarded movements to avoid pain.  SENSATION: Pt reported moderate "pins and needles" sensation with certain movements  EDEMA: none noted  COGNITION: Overall cognitive status: Within functional limits for tasks assessed   OBSERVATIONS: Pt appeared well-kept and agreeable. Pt wore thumb spica splint on R hand and R hand skin appeared intact. No redness or significant edema noted. Pt appeared guarded with movements of R  hand with pt reporting pain with wrist movements.   TODAY'S TREATMENT:                                                                                                                              DATE:   6 minutes: OT educated pt on OT role, POC, pain management by taking breaks PRN, UE anatomy, continue to wear thumb spica splint, tendon glides. Pt acknowledged understanding.   OT initiated HEP tendon glides. Pt demo'd understanding. Handout provided.   PATIENT EDUCATION: Education details: See above Person educated: Patient Education method: Chief Technology Officer Education comprehension: verbalized understanding, returned demonstration, and verbal cues required  HOME EXERCISE PROGRAM: 04/04/23 - tendon glides - see pt instructions  GOALS: Goals reviewed with patient? Yes  SHORT TERM GOALS: Target date: 05/02/23  Patient will demonstrate at least 5 lbs increase of RUE grip strength as needed to open jars and other containers.  Baseline: Right: 29.9 lbs Goal status: INITIAL  2.  Patient will ind demonstrate updated RUE HEP. Baseline:  Goal status: INITIAL  3.  Pt will report no more than 2 out of 10 pain of RUE at rest. Baseline:  5 out of 10 pain at rest Goal status: INITIAL  4.  Pt will independently recall at least 3 total joint protection, ergonomic, and body mechanic principles.   Baseline: new to outpt OT Goal status: INITIAL  LONG TERM GOALS: Target date: 05/30/23  Patient will demonstrate at least 15 lbs increase RUE grip strength as needed to open jars and other containers.  Baseline: Right: 29.9 lbs Goal status: INITIAL  2.  Pt will report no more than mild difficulty with handwriting task of writing a paragraph with at least 80% legibility. Baseline:  Pt reports increased difficulty with handwriting. Goal status: INITIAL  3.  Pt will report no more than mild difficulty with carrying a pan for cooking tasks. Baseline: Pt reports difficulty with  carrying a cast iron pan. Goal status: INITIAL  4.  Pt will report no more than mild difficulty using a knife to cut food for cooking tasks. Baseline: Per [employer], severe difficulty using a knife to cut food. Goal status: INITIAL  5.  Pt will complete 9-hole peg task with RUE without any increase in pain. Baseline: 29 sec. Pt reports using guarded movements to avoid pain. Goal status: INITIAL    ASSESSMENT:  CLINICAL IMPRESSION: Patient is a 57 y.o. female who was seen today for occupational therapy evaluation for Pain in right hand. Hx includes anxiety, arthritis, chronic pain, depression, diabetes, hyperlipidemia, HTN, sleep apnea. Patient currently presents below baseline level of functioning demonstrating functional deficits and impairments as noted below. Pt would benefit from skilled OT services in the outpatient setting to work on impairments as noted below to help pt return to PLOF as able.     PERFORMANCE DEFICITS: in functional skills including ADLs, IADLs, coordination, dexterity, proprioception, sensation, ROM, strength, pain, flexibility, Fine motor control, Gross motor control, mobility, body mechanics, endurance, skin integrity, and UE functional use, cognitive skills including  none , and psychosocial skills including environmental adaptation.   IMPAIRMENTS: are limiting patient from ADLs, IADLs, and work.   COMORBIDITIES: may have co-morbidities  that affects occupational performance. Patient will benefit from skilled OT to address above impairments and improve overall function.  MODIFICATION OR ASSISTANCE TO COMPLETE EVALUATION: No modification of tasks or assist necessary to complete an evaluation.  OT OCCUPATIONAL PROFILE AND HISTORY: Problem focused assessment: Including review of records relating to presenting problem.  CLINICAL DECISION MAKING: LOW - limited treatment options, no task modification necessary  REHAB POTENTIAL: Good  EVALUATION COMPLEXITY:  Low      PLAN:  OT FREQUENCY: 1x/week   OT DURATION: 6 weeks (dates extended to allow for scheduling)  PLANNED INTERVENTIONS: 97168 OT Re-evaluation, 97535 self care/ADL training, 16109 therapeutic exercise, 97530 therapeutic activity, 97112 neuromuscular re-education, 97140 manual therapy, 97035 ultrasound, 97018 paraffin, 60454 fluidotherapy, 97010 moist heat, 97010 cryotherapy, 97032 electrical stimulation (manual), 97014 electrical stimulation unattended, 97760 Orthotics management and training, 09811 Splinting (initial encounter), [id number] Subsequent splinting/medication, manual lymph drainage, scar mobilization, passive range of motion, functional mobility training, energy conservation, patient/family education, and DME and/or AE instructions  RECOMMENDED OTHER SERVICES: N/a  CONSULTED AND AGREED WITH PLAN OF CARE: Patient  PLAN FOR NEXT SESSION:  Trial fluidotherapy with tendon glides and/or ultrasound modality to reduce pain Review options for anti-vibration or no-impact 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     Check all conditions that are expected to impact treatment: {Conditions expected to impact treatment:Musculoskeletal disorders   If treatment provided at initial evaluation, no treatment charged due to lack of authorization.

## 2023-04-06 ENCOUNTER — Encounter (HOSPITAL_BASED_OUTPATIENT_CLINIC_OR_DEPARTMENT_OTHER): Payer: Self-pay | Admitting: Emergency Medicine

## 2023-04-06 ENCOUNTER — Emergency Department (HOSPITAL_BASED_OUTPATIENT_CLINIC_OR_DEPARTMENT_OTHER)
Admission: EM | Admit: 2023-04-06 | Discharge: 2023-04-06 | Disposition: A | Discharge status: Home / Self Care | Payer: MEDICAID | Attending: Emergency Medicine | Admitting: Emergency Medicine

## 2023-04-06 DIAGNOSIS — B9689 Other specified bacterial agents as the cause of diseases classified elsewhere: Secondary | ICD-10-CM

## 2023-04-06 DIAGNOSIS — N898 Other specified noninflammatory disorders of vagina: Secondary | ICD-10-CM | POA: Diagnosis present

## 2023-04-06 DIAGNOSIS — N760 Acute vaginitis: Secondary | ICD-10-CM | POA: Diagnosis not present

## 2023-04-06 LAB — URINALYSIS, ROUTINE W REFLEX MICROSCOPIC
Bacteria, UA: NONE SEEN
Bilirubin Urine: NEGATIVE
Glucose, UA: NEGATIVE mg/dL
Ketones, ur: NEGATIVE mg/dL
Leukocytes,Ua: NEGATIVE
Nitrite: NEGATIVE
Protein, ur: NEGATIVE mg/dL
Specific Gravity, Urine: 1.018 (ref 1.005–1.030)
pH: 7.5 (ref 5.0–8.0)

## 2023-04-06 LAB — WET PREP, GENITAL
Sperm: NONE SEEN
Trich, Wet Prep: NONE SEEN
WBC, Wet Prep HPF POC: >=10 — AB (ref <10)
Yeast Wet Prep HPF POC: NONE SEEN

## 2023-04-06 MED ORDER — METRONIDAZOLE 500 MG PO TABS: 500.0000 mg | ORAL_TABLET | Freq: Two times a day (BID) | ORAL | 0 refills | Status: DC

## 2023-04-06 NOTE — ED Triage Notes (Signed)
Reports vaginal odor and itching x 1 week. Denies abnormal discharge.

## 2023-04-06 NOTE — Discharge Instructions (Addendum)
Your wet prep tested positive for bacterial vaginosis which is a naturally occurring bacteria in the vagina that sometimes overgrowth to cause your symptoms.  I am prescribing a course of metronidazole antibiotic which you should take twice a day for the next 7 days.  Your testing for gonorrhea and chlamydia are pending and should result in the next few days.  Please follow-up your results on MyChart and see your PCP as needed.

## 2023-04-06 NOTE — ED Provider Notes (Signed)
[place] [facility] DEPARTMENT AT [facility] Provider Note   CSN: [id number] Arrival date & time: 04/06/23  1191     History  Chief Complaint  Patient presents with   Vaginal Itching    [name] is a 57 y.o. female.  57 y.o. female presenting with vaginal itching and odor for 2 weeks. She denies new sexual partners. Remote history of STIs that were appropriately treated at the time. She denies vaginal pain, bleeding, fever or chills. Denies dysuria, hematuria, flank pain, suprapubic pain.   The history is provided by the patient. No language interpreter was used.  Vaginal Itching Pertinent negatives include no chest pain, no abdominal pain, no headaches and no shortness of breath.       Home Medications Prior to Admission medications   Medication Sig Start Date End Date Taking? Authorizing Provider  metroNIDAZOLE (FLAGYL) 500 MG tablet Take 1 tablet (500 mg total) by mouth 2 (two) times daily. 04/06/23  Yes Glendale Chard, DO  acyclovir ointment (ZOVIRAX) 5 % Apply 1 Application topically as directed. 08/01/22   [name], MD  atorvastatin (LIPITOR) 20 MG tablet Take 1 tablet (20 mg total) by mouth daily. 02/12/23 02/12/24  [name], MD  Buprenorphine HCl-Naloxone HCl 8-2 MG FILM PLACE 1 FILM UNDER THE TONGUE MORNING AND AT BEDTIME 02/12/23   [name], MD  celecoxib (CELEBREX) 200 MG capsule Take 1 capsule (200 mg total) by mouth 2 (two) times daily between meals as needed. 03/31/23   Kathryne Hitch, MD  ciclopirox (PENLAC) 8 % solution Apply topically at bedtime. 05/07/22   McDonald, Rachelle Hora, DPM  cyclobenzaprine (FLEXERIL) 5 MG tablet TAKE 1 TABLET(5 MG) BY MOUTH THREE TIMES DAILY AS NEEDED FOR MUSCLE SPASMS 10/09/22   [name], MD  diclofenac Sodium (VOLTAREN) 1 % GEL Apply 4 grams topically 4 times daily. 02/12/23   [name], MD  hydrochlorothiazide (HYDRODIURIL) 25 MG tablet Take 1 tablet (25 mg total) by mouth daily. 02/12/23  02/12/24  [name], MD  ketoconazole (NIZORAL) 2 % shampoo Apply topically 2 (two) times a week. 02/13/23   [name], MD  latanoprost (XALATAN) 0.005 % ophthalmic solution PUT 1 DROP INTO BOTH EYES IN THE EVENING 01/10/23   [name], MD  losartan (COZAAR) 50 MG tablet Take 1 tablet (50 mg total) by mouth daily. 10/16/22 10/16/23  [name], MD  Misc. Devices MISC Salicylic acid 4.3% and Undecylenic acid.  Dispense 60 mL.  Please apply to affected nails daily as directed 03/22/21   [provider]  omeprazole (PRILOSEC) 40 MG capsule Take 1 capsule (40 mg total) by mouth daily. 10/16/22 10/16/23  [name], MD  Prenatal Vit-DSS-Fe Fum-FA (PRENATAL 19) tablet Take 1 tablet by mouth daily. 02/20/21   [provider]  Semaglutide, 1 MG/DOSE, 4 MG/3ML SOPN Inject 1 mg into the skin once a week. 02/12/23   [name], MD  triamcinolone cream (KENALOG) 0.5 % APPLY TOPICALLY 3 TIMES DAILY NOT FOR CHRONIC DAILY USE 02/12/23   [name], MD  ursodiol (ACTIGALL) 300 MG capsule Take one tab twice a day with food BEGINNING 14 DAYS AFTER SURGERY 10/16/22   [name], MD      Allergies    Patient has no known allergies.    Review of Systems   Review of Systems  Constitutional:  Negative for activity change, appetite change, chills, fatigue and fever.  Respiratory:  Negative for shortness of breath.   Cardiovascular:  Negative for chest  pain and leg swelling.  Gastrointestinal:  Negative for abdominal pain, constipation, diarrhea, nausea and vomiting.  Genitourinary:  Negative for decreased urine volume, pelvic pain and urgency.  Musculoskeletal:  Negative for arthralgias and back pain.  Neurological:  Negative for dizziness, syncope, light-headedness, numbness and headaches.    Physical Exam Updated Vital Signs BP (!) 149/70 (BP Location: Right Arm)   Pulse 78   Temp 97.9 F (36.6 C) (Oral)   Resp 18   Ht 5\' 8"  (1.727 m)   Wt 124.3 kg   SpO2 100%   BMI 41.66  kg/m  Physical Exam Constitutional:      General: She is not in acute distress.    Appearance: Normal appearance. She is obese. She is not ill-appearing.  HENT:     Head: Normocephalic and atraumatic.  Cardiovascular:     Rate and Rhythm: Normal rate and regular rhythm.     Pulses: Normal pulses.     Heart sounds: Normal heart sounds.  Pulmonary:     Effort: Pulmonary effort is normal. No respiratory distress.  Abdominal:     General: Abdomen is flat. Bowel sounds are normal.     Palpations: Abdomen is soft.  Genitourinary:    General: Normal vulva.     Vagina: No vaginal discharge.     Comments: MA chaperone present for exam Skin:    General: Skin is warm and dry.     Capillary Refill: Capillary refill takes less than 2 seconds.  Neurological:     General: No focal deficit present.     Mental Status: She is alert and oriented to person, place, and time. Mental status is at baseline.  Psychiatric:        Mood and Affect: Mood normal.        Behavior: Behavior normal.        Thought Content: Thought content normal.        Judgment: Judgment normal.     ED Results / Procedures / Treatments   Labs (all labs ordered are listed, but only abnormal results are displayed) Labs Reviewed  WET PREP, GENITAL - Abnormal; Notable for the following components:      Result Value   Clue Cells Wet Prep HPF POC PRESENT (*)    WBC, Wet Prep HPF POC >=10 (*)    All other components within normal limits  URINALYSIS, ROUTINE W REFLEX MICROSCOPIC - Abnormal; Notable for the following components:   Hgb urine dipstick TRACE (*)    All other components within normal limits  GC/CHLAMYDIA PROBE AMP ([place]) NOT AT [facility]    EKG None  Radiology No results found.  Procedures Procedures    Medications Ordered in ED Medications - No data to display  ED Course/ Medical Decision Making/ A&P                                 Medical Decision Making 57 y.o. female presenting with  vaginal itching and odor. On presentation VSS. On exam she is well appearing in NAD, MA chaperone present for exam, vulva normal without signs of acute infection or irritation. Obtained wet prep and GC testing. Patient declined HIV and RPR testing.   On reassessment, wet prep resulted positive for BV.  Will treat patient with 7-day course of metronidazole.  GC labs pending at time of discharge.  Per shared decision making, patient elected to not empirically treat and follow-up results  on MyChart and with her PCP.  Amount and/or Complexity of Data Reviewed Labs: ordered.  Risk Prescription drug management.          Final Clinical Impression(s) / ED Diagnoses Final diagnoses:  BV (bacterial vaginosis)    Rx / DC Orders ED Discharge Orders          Ordered    metroNIDAZOLE (FLAGYL) 500 MG tablet  2 times daily        04/06/23 1019              Glendale Chard, DO 04/06/23 1023    Alvira Monday, MD 04/07/23 2230

## 2023-04-07 ENCOUNTER — Ambulatory Visit: Payer: MEDICAID | Admitting: Occupational Therapy

## 2023-04-07 LAB — GC/CHLAMYDIA PROBE AMP (~~LOC~~) NOT AT ARMC
Chlamydia: NEGATIVE
Comment: NEGATIVE
Comment: NORMAL
Neisseria Gonorrhea: NEGATIVE

## 2023-04-14 ENCOUNTER — Ambulatory Visit: Payer: MEDICAID | Admitting: Occupational Therapy

## 2023-04-14 ENCOUNTER — Ambulatory Visit: Payer: Self-pay | Admitting: Occupational Therapy

## 2023-04-14 ENCOUNTER — Other Ambulatory Visit: Payer: Self-pay

## 2023-04-14 DIAGNOSIS — M79641 Pain in right hand: Secondary | ICD-10-CM

## 2023-04-14 DIAGNOSIS — R278 Other lack of coordination: Secondary | ICD-10-CM

## 2023-04-14 DIAGNOSIS — R208 Other disturbances of skin sensation: Secondary | ICD-10-CM

## 2023-04-14 DIAGNOSIS — M6281 Muscle weakness (generalized): Secondary | ICD-10-CM

## 2023-04-14 MED ORDER — BUPRENORPHINE HCL-NALOXONE HCL 8-2 MG SL FILM: ORAL_FILM | SUBLINGUAL | 1 refills | Status: DC

## 2023-04-14 NOTE — Therapy (Signed)
Patient Name: [name] MRN: [id number] DOB:[birth year], 57 y.o., female Today's Date: 04/14/2023  Pt arrived though pt and OT agreed to reschedule appointment from 8 AM today to 3:30 PM d/t decreased time for OT session. However, pt later called clinic to cancel today's appointment.  Wynetta Emery, OT 04/14/2023, 3:53 PM

## 2023-04-16 ENCOUNTER — Ambulatory Visit: Payer: MEDICAID | Admitting: Neurology

## 2023-04-17 ENCOUNTER — Telehealth: Payer: Self-pay | Admitting: Neurology

## 2023-04-17 ENCOUNTER — Ambulatory Visit: Payer: MEDICAID | Admitting: Neurology

## 2023-04-17 NOTE — Telephone Encounter (Signed)
NS for sleep apnea FU.

## 2023-04-21 ENCOUNTER — Encounter: Payer: MEDICAID | Admitting: Occupational Therapy

## 2023-04-23 ENCOUNTER — Ambulatory Visit: Payer: MEDICAID | Attending: Internal Medicine | Admitting: Occupational Therapy

## 2023-04-23 ENCOUNTER — Telehealth: Payer: Self-pay | Admitting: Occupational Therapy

## 2023-04-23 DIAGNOSIS — M6281 Muscle weakness (generalized): Secondary | ICD-10-CM | POA: Insufficient documentation

## 2023-04-23 DIAGNOSIS — R278 Other lack of coordination: Secondary | ICD-10-CM | POA: Insufficient documentation

## 2023-04-23 DIAGNOSIS — M79641 Pain in right hand: Secondary | ICD-10-CM | POA: Insufficient documentation

## 2023-04-23 DIAGNOSIS — R208 Other disturbances of skin sensation: Secondary | ICD-10-CM | POA: Insufficient documentation

## 2023-04-23 NOTE — Telephone Encounter (Signed)
Therapist called on 04/23/23 and spoke directly to patient re: no show on 04/23/23. Pt was out of town and stated she had a lot going on. Pt was reminded of next appointment on 04/28/23 however, pt hung up before therapist could speak further about our no show policy and requiring 24 hr cancellation

## 2023-04-28 ENCOUNTER — Other Ambulatory Visit: Payer: Self-pay

## 2023-04-28 ENCOUNTER — Ambulatory Visit: Payer: MEDICAID | Admitting: Orthopaedic Surgery

## 2023-04-28 ENCOUNTER — Ambulatory Visit: Payer: MEDICAID | Admitting: Occupational Therapy

## 2023-04-28 DIAGNOSIS — R208 Other disturbances of skin sensation: Secondary | ICD-10-CM | POA: Diagnosis present

## 2023-04-28 DIAGNOSIS — M6281 Muscle weakness (generalized): Secondary | ICD-10-CM

## 2023-04-28 DIAGNOSIS — M79641 Pain in right hand: Secondary | ICD-10-CM | POA: Diagnosis present

## 2023-04-28 DIAGNOSIS — R278 Other lack of coordination: Secondary | ICD-10-CM | POA: Diagnosis present

## 2023-04-28 NOTE — Therapy (Signed)
OUTPATIENT OCCUPATIONAL THERAPY ORTHO Treatment  Patient Name: [name] MRN: [id number] DOB:08/07/1965, 57 y.o., female Today's Date: 04/28/2023  PCP: [name], MD  REFERRING PROVIDER: Kathryne Hitch, MD   END OF SESSION:  OT End of Session - 04/28/23 1245     Visit Number 2    Number of Visits 6    Date for OT Re-Evaluation 05/30/23    Authorization Type [employer] - Auth Required, VL [id number]    OT Start Time 0805    OT Stop Time [phone]    OT Time Calculation (min) 38 min    Activity Tolerance Patient tolerated treatment well    Behavior During Therapy WFL for tasks assessed/performed              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HCC)    Chronic pain    Depression    Diabetes (HCC)    Hyperlipidemia    Hypertension    PONV (postoperative nausea and vomiting)    Sleep apnea    Past Surgical History:  Procedure Laterality Date   ABDOMINAL HYSTERECTOMY  1999   CARPAL TUNNEL RELEASE Right 10/25/2021   Procedure: RIGHT CARPAL TUNNEL RELEASE;  Surgeon: Kathryne Hitch, MD;  Location: [facility];  Service: Orthopedics;  Laterality: Right;   FRACTURE SURGERY     LUNG LOBECTOMY  2010   right lower lobe   ROUX-EN-Y GASTRIC BYPASS  10/30/2020   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   Seborrheic dermatitis 08/01/2020   Other hyperlipidemia 10/21/2019   Sleep apnea, obstructive 10/21/2019   Mood disorder (HCC) 12/18/2017   Essential hypertension 11/25/2017   Type 2 diabetes  mellitus without complications (HCC) 11/24/2017   Severe obesity (BMI >= 40) (HCC) 09/30/2017    ONSET DATE: 03/31/23 (referral date), 04/04/23 (updated referral date)  REFERRING DIAG:  M79.641 (ICD-10-CM) - Pain in right hand  Z98.890 (ICD-10-CM) - Status post carpal tunnel release  R20.0 (ICD-10-CM) - Numbness of left hand  R20.0 (ICD-10-CM) - Numbness of right hand  G56.01 (ICD-10-CM) - Carpal tunnel syndrome, right upper limb    THERAPY DIAG:  Muscle weakness (generalized)  Other lack of coordination  Pain in right hand  Other disturbances of skin sensation  Rationale for Evaluation and Treatment: Rehabilitation  SUBJECTIVE:   SUBJECTIVE STATEMENT: Pt reports rushing this morning so not wearing splints today. Pt reports not taking medication this morning. Pt reports "tenderness" sensitivity at dorsal aspect of R hand. Pt reports R hand pain is "nowhere near" where it was when she came for OT eval.  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 Recently she has had tenderness and pain on the dorsal aspect of her right hand and wrist as well as her thumb. She denies any specific injuries but she does work quite a bit  and drive quite a bit. She is to be a diabetic but she is not anymore. She tries to limit anti-inflammatories due to the detrimental effects that has on her stomach lining. She denies any numbness and tingling. She does have a lot of sensitivity to pain on the dorsum of her wrist and the thumb with ulnar deviation of the wrist but also flexion of the wrist. There is no swelling that I can see. There is no redness. Likely this is a severe tendinitis of the tendons... Will also try a thumb spica splint to take on and  off as comfort allows. I would like to send her to outpatient hand therapy for any modalities that can help decrease her pain."   PRECAUTIONS: None  RED FLAGS: None   WEIGHT BEARING RESTRICTIONS: No  PAIN:  Are you having pain? No ("0.5" out of 10)   FALLS: Has patient fallen in last 6 months? No  LIVING ENVIRONMENT: Lives with: lives with their family and lives with their daughter Lives in: House/apartment Stairs: Yes: External: 2 steps; on right going up Has following equipment at home: Retail banker - 4 wheeled - pt uses these devices as needed d/t knee/ankle pain, but not currently using them  PLOF: Independent, drives a truck for work  PATIENT GOALS: "to alleviate the discomfort/pain"  NEXT MD VISIT: pt reports "not sure" but stated intent to call MD office to confirm  OBJECTIVE:  Note: Objective measures were completed at Evaluation unless otherwise noted.  HAND DOMINANCE: Right  ADLs: Overall ADLs:  Transfers/ambulation related to ADLs: Eating: minimal difficulty Grooming: difficulty with brushing teeth d/t dominant hand affected, completes task UB Dressing: ind, does not generally have buttons, has not attempted zippers LB Dressing: minimal difficulty - wears slide-on shoes, pants "a little" difficult to complete Toileting: difficulty with wiping, ind though "not comfortably" Bathing: no difficulty Tub Shower transfers: ind Equipment: none  Cooking: Pt cooks with cast iron pan and reports difficulty with carrying the pan.  Handwriting: Pt reports increased difficulty with writing.  FUNCTIONAL OUTCOME MEASURES: Upper Extremity Functional Scale (UEFS): 36.4 points    UPPER EXTREMITY ROM:     Active ROM Right eval Left eval  Shoulder flexion [facility]  Shoulder abduction    Shoulder adduction    Shoulder extension    Shoulder internal rotation    Shoulder external rotation    Elbow flexion    Elbow extension        Wrist  extension 25* 29*  Wrist flexion 51* 65*  Wrist radial deviation Some limitation, pt reports d/t pain WFL  Wrist ulnar deviation Some limitation, pt reports d/t pain WFL  Wrist supination [facility] WFL  Wrist pronation WFL WFL  (Blank rows = not tested)  Pt reports some discomfort with wrist ext/flex.  Active ROM Right eval Left eval  Thumb MCP (0-60) WFL                                     Thumb IP (0-80)   Thumb Radial abd/add (0-55)   Thumb Palmar abd/add (0-45)   Thumb Opposition to Small Finger   Index MCP (0-90)   Index PIP (0-100)   Index DIP (0-70)   Long MCP (0-90)   Long PIP (0-100)   Long DIP (0-70)   Ring MCP (0-90)   Ring PIP (0-100)   Ring DIP (0-70)  Little MCP (0-90)   Little PIP (0-100)   Little DIP (0-70)   (Blank rows = not tested)  Note: Pt reports pain at dorsal thumb and dorsal wrist when releasing composite fist.  HAND FUNCTION: Grip strength: Right: 29.9 lbs; Left: 60.1 lbs  COORDINATION: 9 Hole Peg test: Right: 29 sec; Left: 28 sec  Note: OT noted pt appeared guarded and avoided extra movements of RUE when possible. Pt reports using guarded movements to avoid pain.  SENSATION: Pt reported moderate "pins and needles" sensation with certain movements  EDEMA: none noted  COGNITION: Overall cognitive status: Within functional limits for tasks assessed   OBSERVATIONS: Pt appeared well-kept and agreeable. Pt wore thumb spica splint on R hand and R hand skin appeared intact. No redness or significant edema noted. Pt appeared guarded with movements of R hand with pt reporting pain with wrist movements.   TODAY'S TREATMENT:                                                                                                                              DATE:   TherAct Fluidotherapy with tendon glides - 10 minutes timed -  to decrease pain, desensitization, preparation for therapeutic tasks. OT reviewed tendon glides, pt demo'd  understanding. Additional tendon glides handout provided. OT educated pt on carpal tunnel joint protection and anti-vibration gloves. Handout provided, see pt instructions. Pt verbalized understanding.  OT educated pt on anti-vibration gloves options. Pt reported already owning a type of padded glove from [employer]. OT showed additional examples online. See pt instructions for screenshot of one example. OT reiterated importance of finding a glove which allows pt to feel safe when driving with good grasp of steering wheel and to avoid wearing gloves if pt feels unsafe driving. Pt verbalized understanding.  OT educated pt on joint protection during typing tasks (e.g. keeping wrist neutral, placing small towel under wrists when typing). Pt verbalized understanding.  OT educated pt on splint wear schedule: Wear anti-vibration or padded gloves when driving, and wear splint between driving shifts. Pt agreed and verbalized understanding.  TherEx Initiated Median nerve glides HEP - hold 10 second stretches -  to increase nerve gliding, muscle lengthening, decrease pain. Handout provided, see pt instructions.  Pt reported slight increase in pain (2 out of 10) with first exercise, therefore OT instructed pt to limit pain increase to no more than 1-2 levels in pain, decrease stretch or move to next exercise if painful. Pt verbalized understanding.    PATIENT EDUCATION: Education details: See above Person educated: Patient Education method: Chief Technology Officer Education comprehension: verbalized understanding, returned demonstration, verbal cues required, and needs further education  HOME EXERCISE PROGRAM: 04/04/23 - tendon glides - see pt instructions 04/28/23 - anti-vibration work gloves, median nerve glides, carpal tunnel joint protection (see pt instructions)  GOALS: Goals reviewed with patient? Yes  SHORT TERM GOALS: Target date: 05/02/23  Patient will demonstrate at least 5 lbs increase of  RUE grip strength as needed to open jars and other containers.  Baseline: Right: 29.9 lbs Goal status: INITIAL  2.  Patient will ind demonstrate updated RUE HEP. Baseline:  Goal status: INITIAL  3.  Pt will report no more than 2 out of 10 pain of RUE at rest. Baseline:  5 out of 10 pain at rest Goal status: INITIAL  4.  Pt will independently recall at least 3 total joint protection, ergonomic, and body mechanic principles.   Baseline: new to outpt OT Goal status: INITIAL  LONG TERM GOALS: Target date: 05/30/23  Patient will demonstrate at least 15 lbs increase RUE grip strength as needed to open jars and other containers.  Baseline: Right: 29.9 lbs Goal status: INITIAL  2.  Pt will report no more than mild difficulty with handwriting task of writing a paragraph with at least 80% legibility. Baseline: Pt reports increased difficulty with handwriting. Goal status: INITIAL  3.  Pt will report no more than mild difficulty with carrying a pan for cooking tasks. Baseline: Pt reports difficulty with carrying a cast iron pan. Goal status: INITIAL  4.  Pt will report no more than mild difficulty using a knife to cut food for cooking tasks. Baseline: Per [employer], severe difficulty using a knife to cut food. Goal status: INITIAL  5.  Pt will complete 9-hole peg task with RUE without any increase in pain. Baseline: 29 sec. Pt reports using guarded movements to avoid pain. Goal status: INITIAL    ASSESSMENT:  CLINICAL IMPRESSION: Pt reported significantly decreased pain today compared to OT evaluation. Pt tolerated tasks well and verbalized understanding of education. Continuing education regarding joint protection/ergonomics recommended. Pt would benefit from skilled OT services in the outpatient setting to work on impairments as noted below to help pt return to PLOF as able.     PERFORMANCE DEFICITS: in functional skills including ADLs, IADLs, coordination, dexterity,  proprioception, sensation, ROM, strength, pain, flexibility, Fine motor control, Gross motor control, mobility, body mechanics, endurance, skin integrity, and UE functional use, cognitive skills including  none , and psychosocial skills including environmental adaptation.   IMPAIRMENTS: are limiting patient from ADLs, IADLs, and work.   COMORBIDITIES: may have co-morbidities  that affects occupational performance. Patient will benefit from skilled OT to address above impairments and improve overall function.  MODIFICATION OR ASSISTANCE TO COMPLETE EVALUATION: No modification of tasks or assist necessary to complete an evaluation.  OT OCCUPATIONAL PROFILE AND HISTORY: Problem focused assessment: Including review of records relating to presenting problem.  CLINICAL DECISION MAKING: LOW - limited treatment options, no task modification necessary  REHAB POTENTIAL: Good  EVALUATION COMPLEXITY: Low      PLAN:  OT FREQUENCY: 1x/week   OT DURATION: 6 weeks (dates extended to allow for scheduling)  PLANNED INTERVENTIONS: 97168 OT Re-evaluation, 97535 self care/ADL training, 16109 therapeutic exercise, 97530 therapeutic activity, 97112 neuromuscular re-education, 97140 manual therapy, 97035 ultrasound, 97018 paraffin, 60454 fluidotherapy, 97010 moist heat, 97010 cryotherapy, 97032 electrical stimulation (manual), 97014 electrical stimulation unattended, 97760 Orthotics management and training, 09811 Splinting (initial encounter), [id number] Subsequent splinting/medication, manual lymph drainage, scar mobilization, passive range of motion, functional mobility training, energy conservation, patient/family education, and DME and/or AE instructions  RECOMMENDED OTHER SERVICES: N/a  CONSULTED AND AGREED WITH PLAN OF CARE: Patient  PLAN FOR NEXT SESSION:  Continue with fluidotherapy with tendon glides to reduce pain Did pt order an anti-vibration glove for work? How did it go? Initiate additional wrist  flex/ext and digit stretches to  reduce symptoms Initiate RUE theraputty HEP (yellow or theraputty) for UE strengthening   Wynetta Emery, OT 04/28/2023, 12:51 PM   For all possible CPT codes, reference the Planned Interventions line above.     Check all conditions that are expected to impact treatment: {Conditions expected to impact treatment:Musculoskeletal disorders   If treatment provided at initial evaluation, no treatment charged due to lack of authorization.

## 2023-04-29 MED ORDER — LATANOPROST 0.005 % OP SOLN: OPHTHALMIC | 1 refills | Status: DC

## 2023-04-30 ENCOUNTER — Encounter: Payer: Self-pay | Admitting: Orthopaedic Surgery

## 2023-04-30 ENCOUNTER — Ambulatory Visit (INDEPENDENT_AMBULATORY_CARE_PROVIDER_SITE_OTHER): Payer: MEDICAID | Admitting: Orthopaedic Surgery

## 2023-04-30 DIAGNOSIS — M79641 Pain in right hand: Secondary | ICD-10-CM

## 2023-04-30 NOTE — Progress Notes (Signed)
The patient comes in for follow-up after having physical therapy on her right hand and wrist.  She has a remote history of a carpal tunnel release back in May 2023.  She had been having a lot of pain around the base of her thumb and her wrist in general.  She says hand therapy and left wrist splint have helped her significantly as well as the Celebrex.  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  On exam her range of motion is much improved with the right wrist and thumb.  Her pain is minimal at this standpoint.  She has good pinch and grip strength.  She said she is good in terms of her Celebrex and will call if she needs a refill.  She is improved enough that we can have her follow-up as needed.  All question concerns were answered and addressed.

## 2023-05-05 ENCOUNTER — Ambulatory Visit: Payer: MEDICAID | Admitting: Occupational Therapy

## 2023-05-05 ENCOUNTER — Telehealth: Payer: Self-pay | Admitting: Occupational Therapy

## 2023-05-05 NOTE — Telephone Encounter (Signed)
Pt no-showed for today's OT appointment. Therefore, OT called pt's listed mobile number [phone]). Per pt report: Pt reported receiving a call from [facility] on Friday 05/02/23 at which time pt provided update that she would not be attending OT appointment today.  OT provided time of upcoming OT appointment on 05/12/23. Pt verbalized understanding of time/date and confirmed intent to come to next OT appointment.

## 2023-05-12 ENCOUNTER — Ambulatory Visit: Payer: MEDICAID | Admitting: Occupational Therapy

## 2023-05-12 DIAGNOSIS — M6281 Muscle weakness (generalized): Secondary | ICD-10-CM

## 2023-05-12 DIAGNOSIS — M79641 Pain in right hand: Secondary | ICD-10-CM

## 2023-05-12 DIAGNOSIS — R278 Other lack of coordination: Secondary | ICD-10-CM

## 2023-05-12 DIAGNOSIS — R208 Other disturbances of skin sensation: Secondary | ICD-10-CM

## 2023-05-12 NOTE — Therapy (Signed)
OUTPATIENT OCCUPATIONAL THERAPY ORTHO Treatment / Discharge  Patient Name: [name] MRN: [id number] DOB:1965/07/30, 57 y.o., female Today's Date: 05/12/2023  PCP: [name], MD  REFERRING PROVIDER: Kathryne Hitch, MD   OCCUPATIONAL THERAPY DISCHARGE SUMMARY  Visits from Start of Care: 3  Current functional level related to goals / functional outcomes: Pt met 100% of LTG and STG.   Remaining deficits: Pt reports mild discomfort/pain during some functional tasks though pt managing well with joint protection strategies, exercises, medication, and wrist braces.   Education / Equipment: HEP, joint protection, task modification, A/E  Patient agrees to discharge. Patient goals were met. Patient is being discharged due to meeting the stated rehab goals and pt being pleased with current functional level.     END OF SESSION:  OT End of Session - 05/12/23 0838     Visit Number 3    Number of Visits 6    Date for OT Re-Evaluation 05/30/23    Authorization Type [employer] - Auth Required, VL [id number]    OT Start Time 0801    OT Stop Time [phone]    OT Time Calculation (min) 32 min    Activity Tolerance Patient tolerated treatment well    Behavior During Therapy WFL for tasks assessed/performed               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HCC)    Chronic pain    Depression    Diabetes (HCC)    Hyperlipidemia    Hypertension    PONV (postoperative nausea and vomiting)    Sleep apnea    Past Surgical History:  Procedure Laterality Date   ABDOMINAL HYSTERECTOMY  1999   CARPAL TUNNEL RELEASE Right 10/25/2021   Procedure: RIGHT CARPAL TUNNEL RELEASE;  Surgeon: Kathryne Hitch, MD;  Location: [facility];  Service: Orthopedics;  Laterality: Right;   FRACTURE SURGERY     LUNG LOBECTOMY  2010   right lower lobe   ROUX-EN-Y GASTRIC BYPASS  10/30/2020   Patient Active Problem List    Diagnosis Date Noted   Acute pain of right wrist 02/12/2023   Pain in left shoulder 05/22/2022   Heat intolerance 11/28/2021   Vision changes 11/28/2021   Carpal tunnel syndrome, right upper limb 09/19/2021   Osteoarthritis of right shoulder 08/08/2021   Opioid use disorder in remission 07/04/2021   Healthcare maintenance 07/04/2021   Onychomycosis 12/12/2020   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   Seborrheic dermatitis 08/01/2020   Other hyperlipidemia 10/21/2019   Sleep apnea, obstructive 10/21/2019   Mood disorder (HCC) 12/18/2017   Essential hypertension 11/25/2017   Type 2 diabetes mellitus without complications (HCC) 11/24/2017   Severe obesity (BMI >= 40) (HCC) 09/30/2017    ONSET DATE: 03/31/23 (referral date), 04/04/23 (updated referral date)  REFERRING DIAG:  M79.641 (ICD-10-CM) - Pain in right hand  Z98.890 (ICD-10-CM) - Status post carpal tunnel release  R20.0 (ICD-10-CM) - Numbness of left hand  R20.0 (ICD-10-CM) - Numbness of right hand  G56.01 (ICD-10-CM) - Carpal tunnel syndrome, right upper limb    THERAPY DIAG:  Muscle weakness (generalized)  Other lack of coordination  Pain in right hand  Other disturbances of skin sensation  Rationale for Evaluation and Treatment: Rehabilitation  SUBJECTIVE:   SUBJECTIVE STATEMENT: Pt denies recent falls and changes to medication. Pt reports very mild pain of BUE (noted below).  Pt returned to work  recently. Pt reports things are going well and reports some discomfort of hands if moving incorrectly. Pt reports taking medication or using braces relieve the pain/discomfort.  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 Recently she has had tenderness and pain on the dorsal aspect of her right hand and wrist as well as her thumb. She denies any specific injuries but she does work quite a bit and drive quite a bit. She is to be a diabetic but she is not anymore. She tries to limit anti-inflammatories due to the detrimental effects that has on her stomach lining. She denies any numbness and tingling. She does have a lot of sensitivity to pain on the dorsum of her wrist and the thumb with ulnar deviation of the wrist but also flexion of the wrist. There is no swelling that I can see. There is no redness. Likely this is a severe tendinitis of the tendons... Will also try a thumb spica splint to take on and off as comfort allows. I would like to send her to outpatient hand therapy for any modalities that can help decrease her pain."   PRECAUTIONS: None  RED FLAGS: None   WEIGHT BEARING RESTRICTIONS: No  PAIN:  Are you having pain? No ("0.5" out of 10)   FALLS: Has patient fallen in last 6 months? No  LIVING ENVIRONMENT: Lives with: lives with their family and lives with their daughter Lives in: House/apartment Stairs: Yes: External: 2 steps; on right going up Has following equipment at home: Retail banker - 4 wheeled - pt uses these devices as needed d/t knee/ankle pain, but not currently using them  PLOF: Independent, drives a truck for work  PATIENT GOALS: "to alleviate the discomfort/pain"  NEXT MD VISIT: pt reports "not sure" but stated intent to call MD office to confirm  OBJECTIVE:  Note: Objective measures were completed at Evaluation unless otherwise noted.  HAND DOMINANCE: Right  ADLs: Overall ADLs:  Transfers/ambulation related to ADLs: Eating: minimal difficulty Grooming: difficulty with brushing teeth d/t dominant hand affected, completes task UB Dressing: ind, does not  generally have buttons, has not attempted zippers LB Dressing: minimal difficulty - wears slide-on shoes, pants "a little" difficult to complete Toileting: difficulty with wiping, ind though "not comfortably" Bathing: no difficulty Tub Shower transfers: ind Equipment: none  Cooking: Pt cooks with cast iron pan and reports difficulty with carrying the pan.  Handwriting: Pt reports increased difficulty with writing.  FUNCTIONAL OUTCOME MEASURES: Quick Dash: 36.4% deficit     05/12/23 update - Quick Dash: 20.5% deficit     UPPER EXTREMITY ROM:     Active ROM Right eval Left eval  Shoulder flexion [facility]  Shoulder abduction    Shoulder adduction    Shoulder extension    Shoulder internal rotation    Shoulder external rotation    Elbow flexion    Elbow extension        Wrist extension 25* 29*  Wrist flexion 51* 65*  Wrist radial deviation Some limitation, pt reports d/t pain WFL  Wrist ulnar deviation Some limitation, pt reports d/t pain WFL  Wrist supination  [facility] WFL  Wrist pronation WFL WFL  (Blank rows = not tested)  Pt reports some discomfort with wrist ext/flex.  Active ROM Right eval Left eval  Thumb MCP (0-60) WFL                                     Thumb IP (0-80)   Thumb Radial abd/add (0-55)   Thumb Palmar abd/add (0-45)   Thumb Opposition to Small Finger   Index MCP (0-90)   Index PIP (0-100)   Index DIP (0-70)   Long MCP (0-90)   Long PIP (0-100)   Long DIP (0-70)   Ring MCP (0-90)   Ring PIP (0-100)   Ring DIP (0-70)   Little MCP (0-90)   Little PIP (0-100)   Little DIP (0-70)   (Blank rows = not tested)  Note: Pt reports pain at dorsal thumb and dorsal wrist when releasing composite fist.  HAND FUNCTION: Grip strength: Right: 29.9 lbs; Left: 60.1 lbs  COORDINATION: 9 Hole Peg test: Right: 29 sec; Left: 28 sec  Note: OT noted pt appeared guarded and avoided extra movements of RUE when possible. Pt reports using  guarded movements to avoid pain.  SENSATION: Pt reported moderate "pins and needles" sensation with certain movements  EDEMA: none noted  COGNITION: Overall cognitive status: Within functional limits for tasks assessed   OBSERVATIONS: Pt appeared well-kept and agreeable. Pt wore thumb spica splint on R hand and R hand skin appeared intact. No redness or significant edema noted. Pt appeared guarded with movements of R hand with pt reporting pain with wrist movements.   TODAY'S TREATMENT:                                                                                                                              DATE:   TherAct Reviewed HEP. OT provided pt with additional copy of median nerve glides and tendon glides per pt instructions from 04/28/23.  OT provided additional education regarding joint protection strategies to decrease symptoms and to improve attention to joint protection/body mechanics. Pt verbalized understanding. OT provided pt with Dycem material to improve ability to open jars/containers and to improve joint protection. OT educated pt on purpose of Dycem and joint protection. Pt verbalized understanding and then demo'd understanding of using Dycem to open containers.  Assessed progress towards goals, see below. Pt made excellent progress towards goals as evidenced by meeting 100% of LTG and STG.   OT educated pt on potential D/C from OT today d/t great progress towards goals. Pt acknowledged understanding and agreed with OT D/C. Pt confirmed there were no additional questions or concerns.    PATIENT EDUCATION: Education details: See above Person educated: Patient Education method: Chief Technology Officer Education comprehension: verbalized understanding, returned demonstration, verbal cues required, and needs further education  HOME EXERCISE PROGRAM: 04/04/23 - tendon glides -  see pt instructions 04/28/23 - anti-vibration work gloves, median nerve glides, carpal  tunnel joint protection (see pt instructions)  GOALS: Goals reviewed with patient? Yes  SHORT TERM GOALS: Target date: 05/02/23  Patient will demonstrate at least 5 lbs increase of RUE grip strength as needed to open jars and other containers.  Baseline: Right: 29.9 lbs 05/12/23 - Right: 56.8 lbs Goal status: 05/12/23 - MET  2.  Patient will ind demonstrate updated RUE HEP. Baseline:  05/12/23 - Ind with visual handouts. OT educated pt to avoid motions which cause increased pain or discomfort. Pt verbalized understanding. Goal status: 05/12/23 - MET  3.  Pt will report no more than 2 out of 10 pain of RUE at rest. Baseline:  5 out of 10 pain at rest 05/12/23 - Pt reports pain around "0.5 out of 10" on average. Pt reports pain at 3 out of 10 at worst d/t fatigue at end of driving tasks. Goal status: 05/12/23 - MET  4.  Pt will independently recall at least 3 total joint protection, ergonomic, and body mechanic principles.   Baseline: new to outpt OT 05/12/23 - Pt verbalized wearing anti-vibration glove, completing HEP, taking breaks, and keeping wrist in neutral. Goal status: 05/12/23 - MET  LONG TERM GOALS: Target date: 05/30/23  Patient will demonstrate at least 15 lbs increase RUE grip strength as needed to open jars and other containers.  Baseline: Right: 29.9 lbs 05/12/23 - Right: 56.8 lbs Goal status: 05/12/23 - MET  2.  Pt will report no more than mild difficulty with handwriting task of writing a paragraph with at least 80% legibility. Baseline: Pt reports increased difficulty with handwriting. 05/12/23 - Pt demo'd 100% legibility when writing 3 simple sentences. Pt reported "extremely mild" difficulty with handwriting task. Goal status: 05/12/23 - MET  3.  Pt will report no more than mild difficulty with carrying a pan for cooking tasks. Baseline: Pt reports difficulty with carrying a cast iron pan. 05/12/23 - Pt reported lifting cast iron pan without any difficulty.  OT educated pt on joint protection by carrying heavy pots/pans with 2 hands PRN using oven mit or potholder. Pt verbalized understanding. Goal status: 05/12/23 - MET  4.  Pt will report no more than mild difficulty using a knife to cut food for cooking tasks. Baseline: Per QuickDASH, severe difficulty using a knife to cut food. 05/12/23 - Pt reported mild difficulty with cutting foods using a knife. Goal status: 05/12/23 - MET  5.  Pt will complete 9-hole peg task with RUE without any increase in pain. Baseline: 29 sec. Pt reports using guarded movements to avoid pain. 05/12/23 - Right: 23 seconds, Pt reported no increase in pain. Goal status: 05/12/23 - MET    ASSESSMENT:  CLINICAL IMPRESSION: Pt made excellent progress towards goals as evidenced by meeting 100% of LTG and STG. Pt demo'd improved RUE strength, improved RUE coordination, decreased BUE pain, and improved QuickDASH score. D/t great progress towards goals and pt being pleased with current functional level, pt to D/C from OT today. Pt acknowledged understanding of OT D/C and agreed with OT D/C. Pt confirmed there were no additional questions or concerns.      PERFORMANCE DEFICITS: in functional skills including ADLs, IADLs, coordination, dexterity, proprioception, sensation, ROM, strength, pain, flexibility, Fine motor control, Gross motor control, mobility, body mechanics, endurance, skin integrity, and UE functional use, cognitive skills including  none , and psychosocial skills including environmental adaptation.   IMPAIRMENTS: are limiting patient from  ADLs, IADLs, and work.   COMORBIDITIES: may have co-morbidities  that affects occupational performance. Patient will benefit from skilled OT to address above impairments and improve overall function.  MODIFICATION OR ASSISTANCE TO COMPLETE EVALUATION: No modification of tasks or assist necessary to complete an evaluation.  OT OCCUPATIONAL PROFILE AND HISTORY: Problem  focused assessment: Including review of records relating to presenting problem.  CLINICAL DECISION MAKING: LOW - limited treatment options, no task modification necessary  REHAB POTENTIAL: Good  EVALUATION COMPLEXITY: Low      PLAN:  OT FREQUENCY: 1x/week   OT DURATION: 6 weeks (dates extended to allow for scheduling)  PLANNED INTERVENTIONS: 97168 OT Re-evaluation, 97535 self care/ADL training, 78295 therapeutic exercise, 97530 therapeutic activity, 97112 neuromuscular re-education, 97140 manual therapy, 97035 ultrasound, 97018 paraffin, 62130 fluidotherapy, 97010 moist heat, 97010 cryotherapy, 97032 electrical stimulation (manual), 97014 electrical stimulation unattended, 97760 Orthotics management and training, 86578 Splinting (initial encounter), [id number] Subsequent splinting/medication, manual lymph drainage, scar mobilization, passive range of motion, functional mobility training, energy conservation, patient/family education, and DME and/or AE instructions  RECOMMENDED OTHER SERVICES: N/a  CONSULTED AND AGREED WITH PLAN OF CARE: Patient  PLAN FOR NEXT SESSION:  N/A - Pt to D/C from OT today   Wynetta Emery, OT 05/12/2023, 8:43 AM   For all possible CPT codes, reference the Planned Interventions line above.     Check all conditions that are expected to impact treatment: {Conditions expected to impact treatment:Musculoskeletal disorders   If treatment provided at initial evaluation, no treatment charged due to lack of authorization.

## 2023-06-01 ENCOUNTER — Other Ambulatory Visit: Payer: Self-pay | Admitting: Orthopaedic Surgery

## 2023-06-30 ENCOUNTER — Other Ambulatory Visit: Payer: Self-pay

## 2023-06-30 MED ORDER — LATANOPROST 0.005 % OP SOLN: OPHTHALMIC | 3 refills | Status: DC

## 2023-06-30 NOTE — Telephone Encounter (Signed)
 Medication sent to pharmacy

## 2023-07-16 ENCOUNTER — Other Ambulatory Visit: Payer: Self-pay

## 2023-07-16 MED ORDER — BUPRENORPHINE HCL-NALOXONE HCL 8-2 MG SL FILM: ORAL_FILM | SUBLINGUAL | 1 refills | Status: DC

## 2023-07-21 ENCOUNTER — Encounter: Payer: Self-pay | Admitting: Anesthesiology

## 2023-07-21 ENCOUNTER — Telehealth: Payer: Self-pay | Admitting: Neurology

## 2023-07-21 NOTE — Telephone Encounter (Signed)
 Pt called to check for earlier appointment

## 2023-07-23 NOTE — Progress Notes (Signed)
 Patient: [name] Date of Birth: 15-Oct-1965  Reason for Visit: Follow up for CPAP History from: Patient Primary Neurologist: Buck  ASSESSMENT AND PLAN 58 y.o. year old female   1.  OSA on CPAP -Started new CPAP machine in August 2024. -Discussed the importance of nightly usage for minimum of 4 hours -Will continue current settings. Gets awesome sleep with CPAP.  -Follow-up with DME about timer recording, should be recording on 24 hour period  -Follow-up in 1 year or sooner if needed  April 2023 PSG: This study demonstrates overall mild obstructive sleep apnea,  severe in REM sleep with a total AHI of 10.5/hour, REM AHI of  31.3/hour, and O2 nadir of 76%   HISTORY OF PRESENT ILLNESS: Today 07/24/23 Got a new CPAP machine 01/24/23. Using [facility]. Struggling to use > 4 hours. Works as a naval architect. Puts her CPAP on around 9:30 PM, claims wears until 3 or 4 AM. Mentions was told by her DME that the clock starts over every 12 hours, making it very difficult for her compliance to be satisfactory. With CPAP she sleeps awesome, it provides the best sleep. More energy, focus. ESS 10.  90-day CPAP report shows 79%, > 4 hours 26% 4-20 cm water. Leak 25.6, AHI 3.3.  01/09/23 SS: Still room for improvement with CPAP. 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 She doesn't sleep well, her mind races, worries. With CPAP, she sleeps better. Would like to see if she is eligible for new CPAP machine, a more compact one? Make easier to take with her when she travels. Has a new mask, full face mask. Can't say she has more energy during the day, mostly due to stress. ESS 12. She was late today due to rain. 10/04/22-01/01/23 50/90 days 56%, 15 days > 4 hours 17%. Days used 3 hours 14 mins. 4-20 cm water EPR off. Leak 33.1. AHI 2.9.  06/23/22 SS: Here today for CPAP revisit.  Has not consistently been using her CPAP, due to travel, not sleeping well.  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  She has not been sleeping well, reports depression about finding a new job, instead of sleeping has been eating.  She is motivated to get back on track.  She clearly has more restorative sleep with CPAP use.  ESS 14.  Review of CPAP data 06/03/22-07/02/22 shows usage 12/7 days at 40%, greater than 4 hours 3 days at 10%.  Average usage days used 2 hours and 53 minutes.  Minimum pressure 4, maximum pressure 20.  Leak 34.8, AHI 8.8.  She is using a fullface mask.  HISTORY  [name] is a 58 year old right-handed woman with an underlying medical history of diabetes, hypertension, arthritis, chronic pain, history of aspergillosis, status post right lower lung lobectomy and obesity with BMI of over 40 (with weight loss achieved after gastric bypass surgery in May 2022), who presents for follow-up consultation of her sleep apnea, after interim sleep testing and re-starting autoPAP therapy. The patient is unaccompanied today.  I first met her at the request of her primary care physician on 09/12/2021, at which time she reported a prior diagnosis of obstructive sleep apnea.  She had not been using her current PAP machine.  She was advised to proceed with reevaluation with a sleep study.  She had a baseline sleep study on 09/17/2021 which showed a sleep latency of 94 minutes, REM latency 71 minutes, sleep  efficiency 48.7%.  She had 15.5% of REM sleep, 14.6% of slow-wave sleep and 68.4% of stage II sleep.  Total AHI was 10.5/h, rising to 31.3/h during REM sleep, average oxygen saturation 96%, nadir 76% with time below 89% saturation of 30 minutes.  She was advised to restart AutoPap therapy based on her REM related desaturations.  She restarted treatment in April 2023.  Today, 12/20/2021: I reviewed her AutoPap compliance data for the past 3 months, during which time usage is only documented for 12 days out of 90 days, starting at 07/18/2018 if with some interruptions.  Average AHI 1.1/h, 95th percentile of  AutoPap pressure at 13.6 cm with a Default range of 4-20, leak acceptable with the 95th percentile at 9.7 L/min.  She reports using her machine since her sleep study.  She has not taken her machine into her current DME company, [employer], she did get supplies from them but not this machine, this machine had been issued to her before, she is not sure exactly when.  She has a ResMed AirSense 10 AutoSet machine.  She uses a fullface mask and benefits from treatment.   REVIEW OF SYSTEMS: Out of a complete 14 system review of symptoms, the patient complains only of the following symptoms, and all other reviewed systems are negative.  See HPI  ALLERGIES: No Known Allergies  HOME MEDICATIONS: Outpatient Medications Prior to Visit  Medication Sig Dispense Refill   acyclovir  ointment (ZOVIRAX ) 5 % Apply 1 Application topically as directed. 15 g 0   atorvastatin  (LIPITOR) 20 MG tablet Take 1 tablet (20 mg total) by mouth daily. 90 tablet 3   Buprenorphine  HCl-Naloxone  HCl 8-2 MG FILM PLACE 1 FILM UNDER THE TONGUE MORNING AND AT BEDTIME 60 Film 1   celecoxib  (CELEBREX ) 200 MG capsule TAKE 1 CAPSULE(200 MG) BY MOUTH TWICE DAILY BETWEEN MEALS AS NEEDED 60 capsule 1   cyclobenzaprine  (FLEXERIL ) 5 MG tablet TAKE 1 TABLET(5 MG) BY MOUTH THREE TIMES DAILY AS NEEDED FOR MUSCLE SPASMS 30 tablet 3   diclofenac  Sodium (VOLTAREN ) 1 % GEL Apply 4 grams topically 4 times daily. 100 g 3   hydrochlorothiazide  (HYDRODIURIL ) 25 MG tablet Take 1 tablet (25 mg total) by mouth daily. 90 tablet 3   ketoconazole  (NIZORAL ) 2 % shampoo Apply topically 2 (two) times a week. 120 mL 3   latanoprost  (XALATAN ) 0.005 % ophthalmic solution PUT 1 DROP INTO BOTH EYES IN THE EVENING 2.5 mL 3   losartan  (COZAAR ) 50 MG tablet Take 1 tablet (50 mg total) by mouth daily. 90 tablet 3   Misc. Devices MISC Salicylic acid 4.3% and Undecylenic acid.  Dispense 60 mL.  Please apply to affected nails daily as directed     omeprazole  (PRILOSEC) 40 MG  capsule Take 1 capsule (40 mg total) by mouth daily. 90 capsule 3   Prenatal Vit-DSS-Fe Fum-FA (PRENATAL 19) tablet Take 1 tablet by mouth daily.     Semaglutide , 1 MG/DOSE, 4 MG/3ML SOPN Inject 1 mg into the skin once a week. 3 mL 3   triamcinolone  cream (KENALOG ) 0.5 % APPLY TOPICALLY 3 TIMES DAILY NOT FOR CHRONIC DAILY USE 30 g 3   ursodiol  (ACTIGALL ) 300 MG capsule Take one tab twice a day with food BEGINNING 14 DAYS AFTER SURGERY 180 capsule 3   ciclopirox  (PENLAC ) 8 % solution Apply topically at bedtime. (Patient not taking: Reported on 07/24/2023) 6.6 mL 3   metroNIDAZOLE  (FLAGYL ) 500 MG tablet Take 1 tablet (500 mg total) by mouth 2 (  two) times daily. (Patient not taking: Reported on 07/24/2023) 14 tablet 0   No facility-administered medications prior to visit.    PAST MEDICAL HISTORY: 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HCC)    Chronic pain    Depression    Diabetes (HCC)    Hyperlipidemia    Hypertension    PONV (postoperative nausea and vomiting)    Sleep apnea     PAST SURGICAL HISTORY: Past Surgical History:  Procedure Laterality Date   ABDOMINAL HYSTERECTOMY  1999   CARPAL TUNNEL RELEASE Right 10/25/2021   Procedure: RIGHT CARPAL TUNNEL RELEASE;  Surgeon: Vernetta Lonni GRADE, MD;  Location: [facility];  Service: Orthopedics;  Laterality: Right;   FRACTURE SURGERY     LUNG LOBECTOMY  2010   right lower lobe   ROUX-EN-Y GASTRIC BYPASS  10/30/2020    FAMILY HISTORY: Family History  Problem Relation Age of Onset   COPD Mother    Alcohol abuse Father    Pancreatic cancer Father    Diabetes Maternal Grandmother    Hyperlipidemia Maternal Grandmother    Hypertension Maternal Grandmother     SOCIAL HISTORY: Social History   Socioeconomic History   Marital status: Single    Spouse name: Not on file   Number of children: 3   Years of education: Not on file   Highest education level: Not on file   Occupational History   Not on file  Tobacco Use   Smoking status: Some Days    Current packs/day: 0.00    Types: Cigarettes    Last attempt to quit: 07/25/2013    Years since quitting: 10.0   Smokeless tobacco: Never  Vaping Use   Vaping status: Never Used  Substance and Sexual Activity   Alcohol use: Yes    Alcohol/week: 1.0 - 2.0 standard drink of alcohol    Types: 1 - 2 Glasses of wine per week    Comment: rare   Drug use: No   Sexual activity: Yes    Birth control/protection: Surgical  Other Topics Concern   Not on file  Social History Narrative   Not on file   Social Drivers of Health   Financial Resource Strain: Medium Risk (08/29/2022)   Received from [facility], [facility]   Overall Financial Resource Strain (CARDIA)    Difficulty of Paying Living Expenses: Somewhat hard  Food Insecurity: Food Insecurity Present (08/29/2022)   Received from [facility], [facility]   Hunger Vital Sign    Worried About Running Out of Food in the Last Year: Sometimes true    Ran Out of Food in the Last Year: Sometimes true  Transportation Needs: No Transportation Needs (08/29/2022)   Received from [employer], [facility]   PRAPARE - Transportation    Lack of Transportation (Medical): No    Lack of Transportation (Non-Medical): No  Physical Activity: Insufficiently Active (08/29/2022)   Received from [facility], [facility]   Exercise Vital Sign    Days of Exercise per Week: 4 days    Minutes of Exercise per Session: 20 min  Stress: Stress Concern Present (08/29/2022)   Received from [facility], [facility] - Occupational Stress Questionnaire    Feeling of Stress : To some extent  Social Connections: Moderately Integrated (08/29/2022)   Received from [facility], [facility]   Social Network    How would you  rate your social network (family, work, friends)?: Adequate participation with social networks  Intimate  Partner Violence: Not At Risk (08/29/2022)   Received from [facility], [facility]   HITS    Over the last 12 months how often did your partner physically hurt you?: Never    Over the last 12 months how often did your partner insult you or talk down to you?: Rarely    Over the last 12 months how often did your partner threaten you with physical harm?: Never    Over the last 12 months how often did your partner scream or curse at you?: Rarely   PHYSICAL EXAM  Vitals:   07/24/23 0953  BP: 132/68  Pulse: 90  Weight: 279 lb (126.6 kg)  Height: 5' 8 (1.727 m)   Body mass index is 42.42 kg/m.  Generalized: Well developed, in no acute distress  Neurological examination  Mentation: Alert oriented to time, place, history taking. Follows all commands speech and language fluent Cranial nerve II-XII: Pupils were equal round reactive to light. Extraocular movements were full, visual field were full on confrontational test. Facial sensation and strength were normal. Head turning and shoulder shrug  were normal and symmetric. Motor: The motor testing reveals 5 over 5 strength of all 4 extremities. Good symmetric motor tone is noted throughout.   Gait and station: Gait is normal.   DIAGNOSTIC DATA (LABS, IMAGING, TESTING) - I reviewed patient records, labs, notes, testing and imaging myself where available.  Lab Results  Component Value Date   WBC 12.8 (H) 09/22/2013   HGB 13.0 09/22/2013   HCT 39.7 09/22/2013   MCV 90.8 09/22/2013   PLT 327 09/22/2013      Component Value Date/Time   NA 138 02/12/2023 0932   K 4.3 02/12/2023 0932   CL 101 02/12/2023 0932   CO2 27 02/12/2023 0932   GLUCOSE 122 (H) 02/12/2023 0932   GLUCOSE 147 (H) 10/24/2021 1500   BUN 5 (L) 02/12/2023 0932   CREATININE 0.66 02/12/2023 0932   CALCIUM  9.8 02/12/2023 0932   PROT 6.9 07/04/2021 1003   ALBUMIN 3.8 02/12/2023 0932   AST 26 07/04/2021 1003   ALT 24 07/04/2021 1003   ALKPHOS 86 07/04/2021 1003    BILITOT 0.4 07/04/2021 1003   GFRNONAA >60 10/24/2021 1500   GFRAA 118 11/25/2017 0959   Lab Results  Component Value Date   CHOL 149 07/04/2021   HDL 62 07/04/2021   LDLCALC 67 07/04/2021   TRIG 114 07/04/2021   CHOLHDL 2.4 07/04/2021   Lab Results  Component Value Date   HGBA1C 5.9 (A) 02/12/2023   No results found for: [id number] Lab Results  Component Value Date   TSH 0.451 11/28/2021    Lauraine Born, AGNP-C, DNP 07/24/2023, 10:12 AM [facility] [address] [place], [place] [address] [phone]

## 2023-07-24 ENCOUNTER — Encounter: Payer: Self-pay | Admitting: Neurology

## 2023-07-24 ENCOUNTER — Ambulatory Visit: Payer: MEDICAID | Admitting: Neurology

## 2023-07-24 VITALS — BP 132/68 | HR 90 | Ht 68.0 in | Wt 279.0 lb

## 2023-07-24 DIAGNOSIS — G4733 Obstructive sleep apnea (adult) (pediatric): Secondary | ICD-10-CM | POA: Diagnosis not present

## 2023-07-24 NOTE — Patient Instructions (Signed)
 Discuss the timer recording aspect with your DME, should calculate based on 24 hour period. Recommend nightly usage for minimum 4 hours. Request supplies as needed from DME. Return here as needed.

## 2023-07-28 ENCOUNTER — Ambulatory Visit (INDEPENDENT_AMBULATORY_CARE_PROVIDER_SITE_OTHER): Payer: MEDICAID | Admitting: Orthopaedic Surgery

## 2023-07-28 DIAGNOSIS — R200 Anesthesia of skin: Secondary | ICD-10-CM

## 2023-07-28 DIAGNOSIS — M79641 Pain in right hand: Secondary | ICD-10-CM | POA: Diagnosis not present

## 2023-07-28 DIAGNOSIS — R202 Paresthesia of skin: Secondary | ICD-10-CM | POA: Diagnosis not present

## 2023-07-28 MED ORDER — LIDOCAINE HCL 1 % IJ SOLN
0.5000 mL | INTRAMUSCULAR | Status: AC | PRN
  Administered 2023-07-28: .5 mL

## 2023-07-28 MED ORDER — METHYLPREDNISOLONE ACETATE 40 MG/ML IJ SUSP
40.0000 mg | INTRAMUSCULAR | Status: AC | PRN
  Administered 2023-07-28: 40 mg

## 2023-07-28 MED ORDER — LIDOCAINE HCL 1 % IJ SOLN
1.0000 mL | INTRAMUSCULAR | Status: AC | PRN
  Administered 2023-07-28: 1 mL

## 2023-07-28 NOTE — Progress Notes (Signed)
 The patient is someone we have seen in the past.  She is only 58 years old and several years ago we did perform a right open carpal tunnel release.  She had EMG study showing moderate to severe carpal tunnel syndrome.  She is a diabetic but her last hemoglobin A1c was below 6 and she is really worked hard to try to keep her blood glucose under control.  She has known glenohumeral joint osteoarthritis.  She has had a remote history of a right shoulder arthroscopy as well.  She states she is having pain and numbness and tingling globally with her left hand and it does wake her up at night.  She also has pain along the radial aspect of her right wrist.  She said the right shoulder been hurting again as well.  On exam her right wrist shows some pain over the first dorsal compartment.  The remainder of the wrist exam on the right side is normal.  Previous x-rays of her right wrist were also normal.  Her hand is numb and tingling on the left side.  She has equivocal signs of positive Phalen's and Tinel's.  She has weaker grip and pinch strength as well.  We never did obtain nerve conduction studies on that side.  I did recommend a steroid injection over the left transverse carpal ligament as well as a steroid injection over the first dorsal compartment of the right wrist.  She understands that this could elevate her blood glucose and she is to watch this closely.  We half the injection for the right wrist.  She did tolerate both injections well.  We will work on setting up for EMG/nerve conduction studies and then see her back in follow-up when she has the studies on her left upper extremity to rule out carpal tunnel syndrome.  She agrees to the treatment plan.  She did tolerate the steroid injections well.    Procedure Note  Patient: [name]             Date of Birth: [birth year]           MRN: [id number]             Visit Date: 07/28/2023  Procedures: Visit Diagnoses:  1. Numbness and tingling in  left hand   2. Pain in right hand     Hand/UE Inj: L carpal tunnel for carpal tunnel syndrome on 07/28/2023 1:48 PM Medications: 1 mL lidocaine  1 %; 40 mg methylPREDNISolone  acetate 40 MG/ML   Hand/UE Inj: R extensor compartment 1 for de Quervain's tenosynovitis on 07/28/2023 1:48 PM Medications: 0.5 mL lidocaine  1 %; 40 mg methylPREDNISolone  acetate 40 MG/ML

## 2023-07-29 ENCOUNTER — Encounter: Payer: Self-pay | Admitting: Orthopaedic Surgery

## 2023-07-29 ENCOUNTER — Other Ambulatory Visit: Payer: Self-pay

## 2023-07-29 DIAGNOSIS — R200 Anesthesia of skin: Secondary | ICD-10-CM

## 2023-08-04 ENCOUNTER — Ambulatory Visit: Payer: MEDICAID | Admitting: Orthopaedic Surgery

## 2023-08-08 ENCOUNTER — Encounter: Payer: MEDICAID | Admitting: Physical Medicine and Rehabilitation

## 2023-08-13 ENCOUNTER — Ambulatory Visit (INDEPENDENT_AMBULATORY_CARE_PROVIDER_SITE_OTHER): Payer: MEDICAID | Admitting: Physical Medicine and Rehabilitation

## 2023-08-13 DIAGNOSIS — R202 Paresthesia of skin: Secondary | ICD-10-CM

## 2023-08-13 DIAGNOSIS — R200 Anesthesia of skin: Secondary | ICD-10-CM

## 2023-08-13 DIAGNOSIS — M25512 Pain in left shoulder: Secondary | ICD-10-CM

## 2023-08-13 DIAGNOSIS — M79641 Pain in right hand: Secondary | ICD-10-CM | POA: Diagnosis not present

## 2023-08-13 NOTE — Progress Notes (Unsigned)
 [name] - 58 y.o. female MRN [id number]  Date of birth: [birth year]  Office Visit Note: Visit Date: 08/13/2023 PCP: [name], MD Referred by: Kathryne Hitch*  Subjective: Chief Complaint  Patient presents with   Left Hand - Numbness, Weakness   HPI: [name] is a 58 y.o. female who comes in todayHPI   ROS Otherwise per HPI.  Assessment & Plan: Visit Diagnoses:    ICD-10-CM   1. Paresthesia of skin  R20.2 NCV with EMG (electromyography)       Plan: No additional findings.   Meds & Orders: No orders of the defined types were placed in this encounter.   Orders Placed This Encounter  Procedures   NCV with EMG (electromyography)    Follow-up: No follow-ups on file.   Procedures: No procedures performed  EMG & NCV Findings: Evaluation of the left median motor nerve showed prolonged distal onset latency (8.8 ms), reduced amplitude (2.9 mV), and decreased conduction velocity (Elbow-Wrist, 43 m/s).  The left median (across palm) sensory nerve showed no response (Palm), prolonged distal peak latency (8.8 ms), and reduced amplitude (0.7 V).  All remaining nerves (as indicated in the following tables) were within normal limits.    Needle evaluation of the left abductor pollicis brevis muscle showed increased insertional activity, moderately increased spontaneous activity, and diminished recruitment.  All remaining muscles (as indicated in the following table) showed no evidence of electrical instability.    Impression: The above electrodiagnostic study is ABNORMAL and reveals evidence of a severe left median nerve entrapment at the wrist (carpal tunnel syndrome) affecting sensory and motor components.   There is no significant electrodiagnostic evidence of any other focal nerve entrapment, brachial plexopathy or cervical radiculopathy.   Recommendations: 1.  Follow-up with referring physician. 2.  Continue current management of symptoms. 3.  Suggest  surgical evaluation.  ___________________________ Naaman Plummer FAAPMR Board Certified, American Board of Physical Medicine and Rehabilitation    Nerve Conduction Studies Anti Sensory Summary Table   Stim Site NR Peak (ms) Norm Peak (ms) P-T Amp (V) Norm P-T Amp Site1 Site2 Delta-P (ms) Dist (cm) Vel (m/s) Norm Vel (m/s)  Left Median Acr Palm Anti Sensory (2nd Digit)  29.4C  Wrist    *8.8 <3.6 *0.7 >10 Wrist Palm  0.0    Palm *NR  <2.0          Left Radial Anti Sensory (Base 1st Digit)  30.3C  Wrist    2.0 <3.1 44.8  Wrist Base 1st Digit 2.0 0.0    Left Ulnar Anti Sensory (5th Digit)  30.4C  Wrist    3.4 <3.7 22.5 >15.0 Wrist 5th Digit 3.4 14.0 41 >38   Motor Summary Table   Stim Site NR Onset (ms) Norm Onset (ms) O-P Amp (mV) Norm O-P Amp Site1 Site2 Delta-0 (ms) Dist (cm) Vel (m/s) Norm Vel (m/s)  Left Median Motor (Abd Poll Brev)  30.8C  Wrist    *8.8 <4.2 *2.9 >5 Elbow Wrist 5.1 22.0 *43 >50  Elbow    13.9  2.4         Left Ulnar Motor (Abd Dig Min)  31.1C  Wrist    3.0 <4.2 8.1 >3 B Elbow Wrist 3.6 20.0 56 >53  B Elbow    6.6  6.7  A Elbow B Elbow 1.4 11.0 79 >53  A Elbow    8.0  6.4          EMG   Side Muscle  Nerve Root Ins Act Fibs Psw Amp Dur Poly Recrt Int Dennie Bible Comment  Left Abd Poll Brev Median C8-T1 *Incr *2+ *2+ Nml Nml 0 *Reduced Nml   Left 1stDorInt Ulnar C8-T1 Nml Nml Nml Nml Nml 0 Nml Nml   Left PronatorTeres Median C6-7 Nml Nml Nml Nml Nml 0 Nml Nml   Left Biceps Musculocut C5-6 Nml Nml Nml Nml Nml 0 Nml Nml   Left Deltoid Axillary C5-6 Nml Nml Nml Nml Nml 0 Nml Nml     Nerve Conduction Studies Anti Sensory Left/Right Comparison   Stim Site L Lat (ms) R Lat (ms) L-R Lat (ms) L Amp (V) R Amp (V) L-R Amp (%) Site1 Site2 L Vel (m/s) R Vel (m/s) L-R Vel (m/s)  Median Acr Palm Anti Sensory (2nd Digit)  29.4C  Wrist *8.8   *0.7   Wrist Palm     Palm             Radial Anti Sensory (Base 1st Digit)  30.3C  Wrist 2.0   44.8   Wrist Base 1st Digit      Ulnar Anti Sensory (5th Digit)  30.4C  Wrist 3.4   22.5   Wrist 5th Digit 41     Motor Left/Right Comparison   Stim Site L Lat (ms) R Lat (ms) L-R Lat (ms) L Amp (mV) R Amp (mV) L-R Amp (%) Site1 Site2 L Vel (m/s) R Vel (m/s) L-R Vel (m/s)  Median Motor (Abd Poll Brev)  30.8C  Wrist *8.8   *2.9   Elbow Wrist *43    Elbow 13.9   2.4         Ulnar Motor (Abd Dig Min)  31.1C  Wrist 3.0   8.1   B Elbow Wrist 56    B Elbow 6.6   6.7   A Elbow B Elbow 79    A Elbow 8.0   6.4            Waveforms:            Clinical History: No specialty comments available.   She reports that she has been smoking cigarettes. She has never used smokeless tobacco.  Recent Labs    02/12/23 0900  HGBA1C 5.9*    Objective:  VS:  HT:    WT:   BMI:     BP:   HR: bpm  TEMP: ( )  RESP:  Physical Exam  Ortho Exam  Imaging: No results found.  Past Medical/Family/Surgical/Social History: Medications & Allergies reviewed per EMR, new medications updated. 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   Seborrheic dermatitis 08/01/2020   Other hyperlipidemia 10/21/2019   Sleep apnea, obstructive 10/21/2019   Mood disorder (HCC) 12/18/2017   Essential hypertension 11/25/2017   Type 2 diabetes mellitus without complications (HCC) 11/24/2017   Severe obesity (BMI >= 40) (HCC) 09/30/2017   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HCC)    Chronic pain    Depression    Diabetes (HCC)    Hyperlipidemia  Hypertension    PONV (postoperative nausea and  vomiting)    Sleep apnea    Family History  Problem Relation Age of Onset   COPD Mother    Alcohol abuse Father    Pancreatic cancer Father    Diabetes Maternal Grandmother    Hyperlipidemia Maternal Grandmother    Hypertension Maternal Grandmother    Past Surgical History:  Procedure Laterality Date   ABDOMINAL HYSTERECTOMY  1999   CARPAL TUNNEL RELEASE Right 10/25/2021   Procedure: RIGHT CARPAL TUNNEL RELEASE;  Surgeon: Kathryne Hitch, MD;  Location: [facility];  Service: Orthopedics;  Laterality: Right;   FRACTURE SURGERY     LUNG LOBECTOMY  2010   right lower lobe   ROUX-EN-Y GASTRIC BYPASS  10/30/2020   Social History   Occupational History   Not on file  Tobacco Use   Smoking status: Some Days    Current packs/day: 0.00    Types: Cigarettes    Last attempt to quit: 07/25/2013    Years since quitting: 10.0   Smokeless tobacco: Never  Vaping Use   Vaping status: Never Used  Substance and Sexual Activity   Alcohol use: Yes    Alcohol/week: 1.0 - 2.0 standard drink of alcohol    Types: 1 - 2 Glasses of wine per week    Comment: rare   Drug use: No   Sexual activity: Yes    Birth control/protection: Surgical

## 2023-08-13 NOTE — Progress Notes (Unsigned)
 Pain Score---0

## 2023-08-13 NOTE — Procedures (Unsigned)
 EMG & NCV Findings: Evaluation of the left median motor nerve showed prolonged distal onset latency (8.8 ms), reduced amplitude (2.9 mV), and decreased conduction velocity (Elbow-Wrist, 43 m/s).  The left median (across palm) sensory nerve showed no response (Palm), prolonged distal peak latency (8.8 ms), and reduced amplitude (0.7 V).  All remaining nerves (as indicated in the following tables) were within normal limits.    Needle evaluation of the left abductor pollicis brevis muscle showed increased insertional activity, moderately increased spontaneous activity, and diminished recruitment.  All remaining muscles (as indicated in the following table) showed no evidence of electrical instability.    Impression: The above electrodiagnostic study is ABNORMAL and reveals evidence of a severe left median nerve entrapment at the wrist (carpal tunnel syndrome) affecting sensory and motor components.   There is no significant electrodiagnostic evidence of any other focal nerve entrapment, brachial plexopathy or cervical radiculopathy.   Recommendations: 1.  Follow-up with referring physician. 2.  Continue current management of symptoms. 3.  Suggest surgical evaluation.  ___________________________ [name] FAAPMR Board Certified, American Board of Physical Medicine and Rehabilitation    Nerve Conduction Studies Anti Sensory Summary Table   Stim Site NR Peak (ms) Norm Peak (ms) P-T Amp (V) Norm P-T Amp Site1 Site2 Delta-P (ms) Dist (cm) Vel (m/s) Norm Vel (m/s)  Left Median Acr Palm Anti Sensory (2nd Digit)  29.4C  Wrist    *8.8 <3.6 *0.7 >10 Wrist Palm  0.0    Palm *NR  <2.0          Left Radial Anti Sensory (Base 1st Digit)  30.3C  Wrist    2.0 <3.1 44.8  Wrist Base 1st Digit 2.0 0.0    Left Ulnar Anti Sensory (5th Digit)  30.4C  Wrist    3.4 <3.7 22.5 >15.0 Wrist 5th Digit 3.4 14.0 41 >38   Motor Summary Table   Stim Site NR Onset (ms) Norm Onset (ms) O-P Amp (mV) Norm O-P Amp  Site1 Site2 Delta-0 (ms) Dist (cm) Vel (m/s) Norm Vel (m/s)  Left Median Motor (Abd Poll Brev)  30.8C  Wrist    *8.8 <4.2 *2.9 >5 Elbow Wrist 5.1 22.0 *43 >50  Elbow    13.9  2.4         Left Ulnar Motor (Abd Dig Min)  31.1C  Wrist    3.0 <4.2 8.1 >3 B Elbow Wrist 3.6 20.0 56 >53  B Elbow    6.6  6.7  A Elbow B Elbow 1.4 11.0 79 >53  A Elbow    8.0  6.4          EMG   Side Muscle Nerve Root Ins Act Fibs Psw Amp Dur Poly Recrt Int Dennie Bible Comment  Left Abd Poll Brev Median C8-T1 *Incr *2+ *2+ Nml Nml 0 *Reduced Nml   Left 1stDorInt Ulnar C8-T1 Nml Nml Nml Nml Nml 0 Nml Nml   Left PronatorTeres Median C6-7 Nml Nml Nml Nml Nml 0 Nml Nml   Left Biceps Musculocut C5-6 Nml Nml Nml Nml Nml 0 Nml Nml   Left Deltoid Axillary C5-6 Nml Nml Nml Nml Nml 0 Nml Nml     Nerve Conduction Studies Anti Sensory Left/Right Comparison   Stim Site L Lat (ms) R Lat (ms) L-R Lat (ms) L Amp (V) R Amp (V) L-R Amp (%) Site1 Site2 L Vel (m/s) R Vel (m/s) L-R Vel (m/s)  Median Acr Palm Anti Sensory (2nd Digit)  29.4C  Wrist *8.8   *0.7  Wrist Palm     Palm             Radial Anti Sensory (Base 1st Digit)  30.3C  Wrist 2.0   44.8   Wrist Base 1st Digit     Ulnar Anti Sensory (5th Digit)  30.4C  Wrist 3.4   22.5   Wrist 5th Digit 41     Motor Left/Right Comparison   Stim Site L Lat (ms) R Lat (ms) L-R Lat (ms) L Amp (mV) R Amp (mV) L-R Amp (%) Site1 Site2 L Vel (m/s) R Vel (m/s) L-R Vel (m/s)  Median Motor (Abd Poll Brev)  30.8C  Wrist *8.8   *2.9   Elbow Wrist *43    Elbow 13.9   2.4         Ulnar Motor (Abd Dig Min)  31.1C  Wrist 3.0   8.1   B Elbow Wrist 56    B Elbow 6.6   6.7   A Elbow B Elbow 79    A Elbow 8.0   6.4            Waveforms:

## 2023-08-15 ENCOUNTER — Encounter: Payer: Self-pay | Admitting: Physical Medicine and Rehabilitation

## 2023-08-18 ENCOUNTER — Ambulatory Visit: Payer: MEDICAID | Admitting: Orthopaedic Surgery

## 2023-08-18 ENCOUNTER — Other Ambulatory Visit: Payer: Self-pay | Admitting: Internal Medicine

## 2023-08-18 DIAGNOSIS — Z1231 Encounter for screening mammogram for malignant neoplasm of breast: Secondary | ICD-10-CM

## 2023-09-09 ENCOUNTER — Ambulatory Visit: Payer: MEDICAID

## 2023-09-10 ENCOUNTER — Ambulatory Visit: Payer: MEDICAID

## 2023-09-10 ENCOUNTER — Ambulatory Visit
Admission: RE | Admit: 2023-09-10 | Discharge: 2023-09-10 | Disposition: A | Discharge status: Home / Self Care | Payer: MEDICAID | Source: Ambulatory Visit | Attending: Internal Medicine

## 2023-09-10 DIAGNOSIS — Z1231 Encounter for screening mammogram for malignant neoplasm of breast: Secondary | ICD-10-CM

## 2023-09-15 ENCOUNTER — Other Ambulatory Visit: Payer: Self-pay

## 2023-09-15 ENCOUNTER — Telehealth: Payer: Self-pay

## 2023-09-15 ENCOUNTER — Ambulatory Visit: Payer: MEDICAID

## 2023-09-15 ENCOUNTER — Telehealth: Payer: Self-pay | Admitting: Internal Medicine

## 2023-09-15 NOTE — Telephone Encounter (Signed)
 Prior Authorization for patient (Ozempic (1 MG/DOSE) 4MG /3ML pen-injectors) came through on cover my meds was submitted per cover my meds.Marland Kitchen  KEY:[id number]

## 2023-09-15 NOTE — Telephone Encounter (Signed)
 Prior authorization for both medications have been submitted. Please see note from today (09/15/23)

## 2023-09-15 NOTE — Telephone Encounter (Unsigned)
 Copied from CRM [id number]. Topic: Clinical - Medication Refill >> Sep 15, 2023  1:53 PM Philippa Chester F wrote: Most Recent Primary Care Visit:  Provider: [name]  Department: [facility]  Visit Type: TELEPHONE OFFICE VISIT  Date: 03/12/2023  Medication:   1. Buprenorphine HCl-Naloxone HCl 8-2 MG FILM  2. Semaglutide, 1 MG/DOSE, 4 MG/3ML SOPN  Has the patient contacted their pharmacy? Yes, patient was infromed they are still awaiting approval from the patient's doctor office   Is this the correct pharmacy for this prescription? Yes If no, delete pharmacy and type the correct one.  This is the patient's preferred pharmacy:  [facility] [phone] - [place], [place] - [address] AT [facility] [address] [place] [address] Phone: [phone] Fax: [phone]   Has the prescription been filled recently? No  Is the patient out of the medication? Yes  Has the patient been seen for an appointment in the last year OR does the patient have an upcoming appointment? yes  Can we respond through MyChart? Yes  Agent: Please be advised that Rx refills may take up to 3 business days. We ask that you follow-up with your pharmacy.

## 2023-09-15 NOTE — Telephone Encounter (Signed)
 [name] (Key: [id number]) [name] (1 MG/DOSE) 4MG [name] pen-injectors Form [employer] Electronic Prior Authorization Form Created Sent to Plan Plan Response Submit Clinical Questions Determination Message from Plan Prior Authorization Not Required

## 2023-09-15 NOTE — Telephone Encounter (Signed)
 Prior Authorization for patient (Buprenorphine HCl-Naloxone HCl 8-2MG  films) came through on cover my meds was submitted with last office notes awaiting approval or denial.  [id number])

## 2023-09-15 NOTE — Telephone Encounter (Signed)
 Copied from CRM [id number]. Topic: Clinical - Prescription Issue >> Sep 15, 2023  1:46 PM [name] wrote: Reason for CRM:   Patient is in need of a prior authorization for both her Ozempic (Semaglutide, 1 MG/DOSE, 4 MG/3ML SOPN)  and Suboxone (Buprenorphine HCl-Naloxone HCl 8-2 MG FILM) . Patient has been in touch with the pharmacy and the pharmacy waiting on a response.   [facility] [phone] - [place], [place] - [address] AT [facility] [address] [place] [address] Phone: [phone] Fax: [phone] Hours: Not open 24 hours   Callback : [phone]

## 2023-09-16 ENCOUNTER — Telehealth: Payer: Self-pay

## 2023-09-16 MED ORDER — SEMAGLUTIDE (1 MG/DOSE) 4 MG/3ML ~~LOC~~ SOPN: 1.0000 mg | PEN_INJECTOR | SUBCUTANEOUS | 3 refills | Status: DC

## 2023-09-16 MED ORDER — BUPRENORPHINE HCL-NALOXONE HCL 8-2 MG SL FILM: ORAL_FILM | SUBLINGUAL | 1 refills | Status: DC

## 2023-09-16 NOTE — Telephone Encounter (Signed)
 [name] (Key: [id number]) Need Help? Call us at [phone] Outcome Additional Information Required Prior Authorization Not Required Drug Suboxone 8-2MG  films ePA cloud logo Form [employer] Electronic Prior Authorization Form

## 2023-09-16 NOTE — Telephone Encounter (Signed)
 [name] ([name]) PA Case ID #: [id number] Need Help? Call us at [phone] Outcome N/A today by [employer] 2017 General.Closed by health plan.We thank you for taking the time to submit your request. However, this request has been closed because you told us on 09/16/2023 that the member's medication has been switched to a recommended formulary alternative, Suboxone Sublingual Film 8-2 MG (brand name). Drug Buprenorphine HCl-Naloxone HCl 8-2MG  films ePA cloud logo Form [employer] Electronic Prior Authorization Form

## 2023-09-16 NOTE — Telephone Encounter (Signed)
 Prior Authorization resubmitted under brand name (Suboxone 8-2MG  films) came through on cover my meds was submitted  [id number]

## 2023-09-29 ENCOUNTER — Ambulatory Visit: Payer: MEDICAID | Admitting: Orthopaedic Surgery

## 2023-10-16 ENCOUNTER — Ambulatory Visit: Payer: MEDICAID | Admitting: Orthopaedic Surgery

## 2023-10-20 ENCOUNTER — Other Ambulatory Visit: Payer: Self-pay

## 2023-10-20 DIAGNOSIS — I10 Essential (primary) hypertension: Secondary | ICD-10-CM

## 2023-10-20 MED ORDER — LOSARTAN POTASSIUM 50 MG PO TABS: 50.0000 mg | ORAL_TABLET | Freq: Every day | ORAL | 3 refills | Status: AC

## 2023-10-20 MED ORDER — KETOCONAZOLE 2 % EX SHAM: MEDICATED_SHAMPOO | CUTANEOUS | 3 refills | Status: AC

## 2023-10-29 ENCOUNTER — Encounter: Payer: Self-pay | Admitting: Orthopaedic Surgery

## 2023-10-29 ENCOUNTER — Ambulatory Visit (INDEPENDENT_AMBULATORY_CARE_PROVIDER_SITE_OTHER): Payer: MEDICAID | Admitting: Orthopaedic Surgery

## 2023-10-29 DIAGNOSIS — M25511 Pain in right shoulder: Secondary | ICD-10-CM

## 2023-10-29 DIAGNOSIS — G8929 Other chronic pain: Secondary | ICD-10-CM | POA: Diagnosis not present

## 2023-10-29 DIAGNOSIS — G5602 Carpal tunnel syndrome, left upper limb: Secondary | ICD-10-CM | POA: Diagnosis not present

## 2023-10-29 MED ORDER — LIDOCAINE HCL 1 % IJ SOLN
3.0000 mL | INTRAMUSCULAR | Status: AC | PRN
  Administered 2023-10-29: 3 mL

## 2023-10-29 MED ORDER — METHYLPREDNISOLONE ACETATE 40 MG/ML IJ SUSP
40.0000 mg | INTRAMUSCULAR | Status: AC | PRN
  Administered 2023-10-29: 40 mg via INTRA_ARTICULAR

## 2023-10-29 NOTE — Progress Notes (Signed)
 The patient is well-known to us .  She comes in today to go over nerve conduction studies of her left upper extremity.  She has a remote history of a right open carpal tunnel release that we did sometime ago.  She is 58 years old and active.  She does work as a Naval architect.  She is also requesting a steroid injection in her right shoulder today.  Nerve conduction studies were reviewed with her and it does show that she has severe carpal tunnel syndrome with entrapment of the median nerve at the transverse carpal ligament of the wrist.  This is on the left side.  On exam she does have positive Phalen's and Tinel signs as well as numbness and tingling in the median nerve distribution as well as weakness with pinch and grip strength.  Her right shoulder mainly showed signs of impingement with excellent range of motion and good strength.  Per her request I did place a sterile injection in her right shoulder which she tolerated well.  She would like us  to go ahead and set her up for a left open carpal tunnel release.  Having had this before she is fully aware of what the surgery involves as well as discussion of risk and benefits of surgery.  Will be in touch with scheduling her for a left open carpal tunnel release.  We could then see her back in 2 weeks postoperative.    Procedure Note  Patient: [name]             Date of Birth: 1966/01/31           MRN: [id number]             Visit Date: 10/29/2023  Procedures: Visit Diagnoses:  1. Carpal tunnel syndrome, left upper limb   2. Chronic right shoulder pain     Large Joint Inj: R subacromial bursa on 10/29/2023 10:53 AM Indications: pain and diagnostic evaluation Details: 22 G 1.5 in needle  Arthrogram: No  Medications: 3 mL lidocaine  1 %; 40 mg methylPREDNISolone  acetate 40 MG/ML Outcome: tolerated well, no immediate complications Procedure, treatment alternatives, risks and benefits explained, specific risks discussed. Consent was given  by the patient. Immediately prior to procedure a time out was called to verify the correct patient, procedure, equipment, support staff and site/side marked as required. Patient was prepped and draped in the usual sterile fashion.

## 2023-11-07 ENCOUNTER — Telehealth: Payer: Self-pay

## 2023-11-07 NOTE — Telephone Encounter (Signed)
 I called patient to discuss scheduling CTR.  She will call me back Tuesday to set a date.

## 2023-11-17 ENCOUNTER — Other Ambulatory Visit: Payer: Self-pay | Admitting: Internal Medicine

## 2023-11-17 NOTE — Telephone Encounter (Signed)
 Copied from CRM [id number]. Topic: Clinical - Medication Refill >> Nov 17, 2023  9:11 AM Tisa Forester wrote: Medication: suboxone    Has the patient contacted their pharmacy? No (Agent: If no, request that the patient contact the pharmacy for the refill. If patient does not wish to contact the pharmacy document the reason why and proceed with request.) (Agent: If yes, when and what did the pharmacy advise?)  This is the patient's preferred pharmacy:  [facility] [phone] - [place], [place] - [address] AT [facility] [address] [place] [address] Phone: [phone] Fax: [phone]  Is this the correct pharmacy for this prescription? Yes If no, delete pharmacy and type the correct one.   Has the prescription been filled recently? No  Is the patient out of the medication? No  Has the patient been seen for an appointment in the last year OR does the patient have an upcoming appointment? No  Can we respond through MyChart? No  Agent: Please be advised that Rx refills may take up to 3 business days. We ask that you follow-up with your pharmacy.

## 2023-11-17 NOTE — Telephone Encounter (Signed)
 Last Fill: 09/16/23 60 tabs/1 RF  Last OV: 03/12/23 Next OV: None Scheduled  Routing to provider for review/authorization.

## 2023-11-18 MED ORDER — BUPRENORPHINE HCL-NALOXONE HCL 8-2 MG SL FILM: ORAL_FILM | SUBLINGUAL | 1 refills | Status: DC

## 2023-12-01 ENCOUNTER — Other Ambulatory Visit: Payer: Self-pay

## 2023-12-02 MED ORDER — TRIAMCINOLONE ACETONIDE 0.5 % EX CREA: TOPICAL_CREAM | CUTANEOUS | 3 refills | Status: AC

## 2023-12-18 ENCOUNTER — Other Ambulatory Visit: Payer: Self-pay | Admitting: Orthopaedic Surgery

## 2023-12-18 ENCOUNTER — Telehealth: Payer: Self-pay

## 2023-12-18 DIAGNOSIS — G5602 Carpal tunnel syndrome, left upper limb: Secondary | ICD-10-CM | POA: Diagnosis not present

## 2023-12-18 MED ORDER — HYDROCODONE-ACETAMINOPHEN 5-325 MG PO TABS: 1.0000 | ORAL_TABLET | Freq: Four times a day (QID) | ORAL | 0 refills | Status: DC | PRN

## 2023-12-18 NOTE — Telephone Encounter (Signed)
 Cpap order faxed to [facility] [phone]

## 2023-12-24 ENCOUNTER — Telehealth: Payer: Self-pay

## 2023-12-24 NOTE — Telephone Encounter (Signed)
 Patient called to clarify discharge instructions, when bandage could be removed. She also want to see if she could change appt to 7/14th. I informed her that appointment needed to be as close to 2 wks as possible. She is not having any complications at this time. She will keep appointment on 7/16.

## 2023-12-25 ENCOUNTER — Telehealth: Payer: Self-pay

## 2023-12-25 NOTE — Telephone Encounter (Signed)
 Patient called concerning bandage removal and what could be removed.  CB# [id number].  Please advise.  Thank you

## 2023-12-25 NOTE — Telephone Encounter (Signed)
Patient aware of the below message from [place]man  

## 2023-12-29 ENCOUNTER — Encounter: Payer: Self-pay | Admitting: *Deleted

## 2023-12-29 ENCOUNTER — Other Ambulatory Visit: Payer: Self-pay

## 2023-12-29 ENCOUNTER — Encounter: Payer: Self-pay | Admitting: Student

## 2023-12-29 ENCOUNTER — Ambulatory Visit: Payer: MEDICAID | Admitting: Student

## 2023-12-29 VITALS — BP 134/57 | HR 62 | Temp 97.9°F | Ht 68.0 in | Wt 280.0 lb

## 2023-12-29 DIAGNOSIS — E119 Type 2 diabetes mellitus without complications: Secondary | ICD-10-CM | POA: Diagnosis not present

## 2023-12-29 DIAGNOSIS — F1191 Opioid use, unspecified, in remission: Secondary | ICD-10-CM

## 2023-12-29 DIAGNOSIS — Z7985 Long-term (current) use of injectable non-insulin antidiabetic drugs: Secondary | ICD-10-CM | POA: Diagnosis not present

## 2023-12-29 DIAGNOSIS — I10 Essential (primary) hypertension: Secondary | ICD-10-CM

## 2023-12-29 LAB — POCT GLYCOSYLATED HEMOGLOBIN (HGB A1C): Hemoglobin A1C: 5.8 % — AB (ref 4.0–5.6)

## 2023-12-29 LAB — GLUCOSE, CAPILLARY: Glucose-Capillary: 110 mg/dL — ABNORMAL HIGH (ref 70–99)

## 2023-12-29 MED ORDER — OZEMPIC (2 MG/DOSE) 8 MG/3ML ~~LOC~~ SOPN: 2.0000 mg | PEN_INJECTOR | SUBCUTANEOUS | 3 refills | Status: DC

## 2023-12-29 NOTE — Patient Instructions (Signed)
 Thank you so much for coming to the clinic today!   I have increased your Ozempic  to 2mg  today.  Please restart your suboxone  and stop the hydrocodone   If you have any questions please feel free to the call the clinic at anytime at [phone]. It was a pleasure seeing you!  Best, Dr. Rome Schlauch

## 2023-12-29 NOTE — Progress Notes (Signed)
 CC: Chronic Condition Follow Up  HPI:  [name] is a 58 y.o. female living with a history stated below and presents today for chronic conditions follow up. Please see problem based assessment and plan for additional details.  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HCC)    Chronic pain    Depression    Diabetes (HCC)    Hyperlipidemia    Hypertension    PONV (postoperative nausea and vomiting)    Sleep apnea     Current Outpatient Medications on File Prior to Visit  Medication Sig Dispense Refill   acyclovir  ointment (ZOVIRAX ) 5 % Apply 1 Application topically as directed. 15 g 0   atorvastatin  (LIPITOR) 20 MG tablet Take 1 tablet (20 mg total) by mouth daily. 90 tablet 3   Buprenorphine  HCl-Naloxone  HCl 8-2 MG FILM PLACE 1 FILM UNDER THE TONGUE MORNING AND AT BEDTIME 60 Film 1   celecoxib  (CELEBREX ) 200 MG capsule TAKE 1 CAPSULE(200 MG) BY MOUTH TWICE DAILY BETWEEN MEALS AS NEEDED 60 capsule 1   ciclopirox  (PENLAC ) 8 % solution Apply topically at bedtime. (Patient not taking: Reported on 07/24/2023) 6.6 mL 3   cyclobenzaprine  (FLEXERIL ) 5 MG tablet TAKE 1 TABLET(5 MG) BY MOUTH THREE TIMES DAILY AS NEEDED FOR MUSCLE SPASMS 30 tablet 3   diclofenac  Sodium (VOLTAREN ) 1 % GEL Apply 4 grams topically 4 times daily. 100 g 3   hydrochlorothiazide  (HYDRODIURIL ) 25 MG tablet Take 1 tablet (25 mg total) by mouth daily. 90 tablet 3   ketoconazole  (NIZORAL ) 2 % shampoo Apply topically 2 (two) times a week. 120 mL 3   latanoprost  (XALATAN ) 0.005 % ophthalmic solution PUT 1 DROP INTO BOTH EYES IN THE EVENING 2.5 mL 3   losartan  (COZAAR ) 50 MG tablet Take 1 tablet (50 mg total) by mouth daily. 90 tablet 3   metroNIDAZOLE  (FLAGYL ) 500 MG tablet Take 1 tablet (500 mg total) by mouth 2 (two) times daily. (Patient not taking: Reported on 07/24/2023) 14 tablet 0   Misc. Devices MISC Salicylic acid 4.3% and Undecylenic acid.  Dispense 60 mL.  Please apply  to affected nails daily as directed     omeprazole  (PRILOSEC) 40 MG capsule Take 1 capsule (40 mg total) by mouth daily. 90 capsule 3   Prenatal Vit-DSS-Fe Fum-FA (PRENATAL 19) tablet Take 1 tablet by mouth daily.     triamcinolone  cream (KENALOG ) 0.5 % APPLY TOPICALLY 3 TIMES DAILY NOT FOR CHRONIC DAILY USE 30 g 3   ursodiol  (ACTIGALL ) 300 MG capsule Take one tab twice a day with food BEGINNING 14 DAYS AFTER SURGERY 180 capsule 3   No current facility-administered medications on file prior to visit.    Family History  Problem Relation Age of Onset   COPD Mother    Alcohol abuse Father    Pancreatic cancer Father    Diabetes Maternal Grandmother    Hyperlipidemia Maternal Grandmother    Hypertension Maternal Grandmother    BRCA 1/2 Neg Hx    Breast cancer Neg Hx     Social History   Socioeconomic History   Marital status: Single    Spouse name: Not on file   Number of children: 3   Years of education: Not on file   Highest education level: Not on file  Occupational History   Not on file  Tobacco Use   Smoking status: Some Days    Current packs/day: 0.00  Types: Cigarettes    Last attempt to quit: 07/25/2013    Years since quitting: 10.4   Smokeless tobacco: Never  Vaping Use   Vaping status: Never Used  Substance and Sexual Activity   Alcohol use: Yes    Alcohol/week: 1.0 - 2.0 standard drink of alcohol    Types: 1 - 2 Glasses of wine per week    Comment: rare   Drug use: No   Sexual activity: Yes    Birth control/protection: Surgical  Other Topics Concern   Not on file  Social History Narrative   Not on file   Social Drivers of Health   Financial Resource Strain: Medium Risk (08/29/2022)   Received from [facility]   Overall Financial Resource Strain (CARDIA)    Difficulty of Paying Living Expenses: Somewhat hard  Food Insecurity: Food Insecurity Present (08/29/2022)   Received from [facility]   Hunger Vital Sign    Within the past 12 months, you  worried that your food would run out before you got the money to buy more.: Sometimes true    Within the past 12 months, the food you bought just didn't last and you didn't have money to get more.: Sometimes true  Transportation Needs: No Transportation Needs (08/29/2022)   Received from [facility] - Transportation    Lack of Transportation (Medical): No    Lack of Transportation (Non-Medical): No  Physical Activity: Insufficiently Active (08/29/2022)   Received from [facility]   Exercise Vital Sign    On average, how many days per week do you engage in moderate to strenuous exercise (like a brisk walk)?: 4 days    On average, how many minutes do you engage in exercise at this level?: 20 min  Stress: Stress Concern Present (08/29/2022)   Received from [facility] - Occupational Stress Questionnaire    Feeling of Stress : To some extent  Social Connections: Moderately Integrated (08/29/2022)   Received from [facility]   Social Network    How would you rate your social network (family, work, friends)?: Adequate participation with social networks  Intimate Partner Violence: Not At Risk (08/29/2022)   Received from [facility]   HITS    Over the last 12 months how often did your partner physically hurt you?: Never    Over the last 12 months how often did your partner insult you or talk down to you?: Rarely    Over the last 12 months how often did your partner threaten you with physical harm?: Never    Over the last 12 months how often did your partner scream or curse at you?: Rarely    Review of Systems: ROS negative except for what is noted on the assessment and plan.  Vitals:   12/29/23 1101 12/29/23 1106  BP: (!) 150/58 (!) 134/57  Pulse: 80 62  Temp: 97.9 F (36.6 C)   TempSrc: Oral   SpO2: 99%   Weight: 280 lb (127 kg)   Height: 5' 8 (1.727 m)     Physical Exam: Constitutional: well-appearing female  in no  acute distress HENT: normocephalic atraumatic, mucous membranes moist Eyes: conjunctiva non-erythematous Neck: supple Cardiovascular: regular rate and rhythm, no m/r/g Pulmonary/Chest: normal work of breathing on room air, lungs clear to auscultation bilaterally Abdominal: soft, non-tender, non-distended MSK: normal bulk and tone Neurological: alert & oriented x 3, 5/5 strength in bilateral upper and lower extremities, normal gait Skin: warm  and dry Psych: normal mood and affect  Assessment & Plan:   Type 2 diabetes mellitus without complications (HCC) A1c currently at 5.8, slight decrease from 5.9 at last check. She is currently only takign ozempic  1mg , will increase to 2mg  with weight loss benefit and continued improvement in glycemic control.   Opioid use disorder in remission On chronic suboxone  therapy for OUD which is now currently in remission. She recently underwent carpal tunnel surgery on her left hand on 7/3, and she was prescribed Norco. She states she has been taking this, but has not been taking the suboxone . She has only been taking one daily for her pain. Unclear why she was prescribed when she had the suboxone , however she will stop the Norco and continue with the suboxone . Rechecking her Toxassure today, and stopping norco.   Plan:  - Toxassure today  Essential hypertension Vitals:   12/29/23 1101 12/29/23 1106  BP: (!) 150/58 (!) 134/57    Current regimen is hydrochlorothiazide  25mg  and Losartan  50mg , which she did take this morning. Blood pressure initially high, improved on second attempt as seen above. She does have low diastolic pressures (which seems to be chronic for her), so would hold on further uptitration regardless for now.   Patient discussed with Dr. Karna Dirks [name], M.D. [facility] Internal Medicine, PGY-3 Pager: [phone] Date 12/29/2023 Time 2:20 PM

## 2023-12-29 NOTE — Assessment & Plan Note (Signed)
 A1c currently at 5.8, slight decrease from 5.9 at last check. She is currently only takign ozempic  1mg , will increase to 2mg  with weight loss benefit and continued improvement in glycemic control.

## 2023-12-29 NOTE — Assessment & Plan Note (Signed)
 On chronic suboxone  therapy for OUD which is now currently in remission. She recently underwent carpal tunnel surgery on her left hand on 7/3, and she was prescribed Norco. She states she has been taking this, but has not been taking the suboxone . She has only been taking one daily for her pain. Unclear why she was prescribed when she had the suboxone , however she will stop the Norco and continue with the suboxone . Rechecking her Toxassure today, and stopping norco.   Plan:  - Toxassure today

## 2023-12-29 NOTE — Assessment & Plan Note (Signed)
 Vitals:   12/29/23 1101 12/29/23 1106  BP: (!) 150/58 (!) 134/57    Current regimen is hydrochlorothiazide  25mg  and Losartan  50mg , which she did take this morning. Blood pressure initially high, improved on second attempt as seen above. She does have low diastolic pressures (which seems to be chronic for her), so would hold on further uptitration regardless for now.

## 2023-12-31 ENCOUNTER — Encounter: Payer: Self-pay | Admitting: Physician Assistant

## 2023-12-31 ENCOUNTER — Ambulatory Visit (INDEPENDENT_AMBULATORY_CARE_PROVIDER_SITE_OTHER): Payer: MEDICAID | Admitting: Physician Assistant

## 2023-12-31 DIAGNOSIS — G5602 Carpal tunnel syndrome, left upper limb: Secondary | ICD-10-CM

## 2023-12-31 LAB — TOXASSURE SELECT,+ANTIDEPR,UR

## 2023-12-31 NOTE — Progress Notes (Signed)
 HPI [name] returns today 2 weeks status post left carpal tunnel release.  She is overall doing well some soreness tightness.  No fevers chills no drainage.  Physical exam: Left hand radial pulses 2+.  Surgical incisions healing well no signs of dehiscence.  Sutures are harvested.  Steri-Strips applied.  She has full motor of the hand and subjective sensation is intact throughout the fingertips.  Impression: Status post left carpal tunnel release 12/23/2023  Plan: She will work on scar tissue medial localization.  Work on range of motion fingers.  She is to be keep the hand from being submerged.  Can shower and wash the hand for hygiene purposes.  Follow-up with us  in 1 month.

## 2024-01-01 ENCOUNTER — Ambulatory Visit: Payer: Self-pay | Admitting: Student

## 2024-01-02 NOTE — Progress Notes (Signed)
 Internal Medicine Clinic Attending  Case discussed with the resident at the time of the visit.  We reviewed the resident's history and exam and pertinent patient test results.  I agree with the assessment, diagnosis, and plan of care documented in the resident's note.

## 2024-01-12 ENCOUNTER — Other Ambulatory Visit: Payer: Self-pay

## 2024-01-12 MED ORDER — BUPRENORPHINE HCL-NALOXONE HCL 8-2 MG SL FILM: ORAL_FILM | SUBLINGUAL | 1 refills | Status: DC

## 2024-01-28 ENCOUNTER — Ambulatory Visit: Payer: MEDICAID | Admitting: Orthopaedic Surgery

## 2024-01-28 ENCOUNTER — Encounter: Payer: Self-pay | Admitting: Orthopaedic Surgery

## 2024-01-28 DIAGNOSIS — Z9889 Other specified postprocedural states: Secondary | ICD-10-CM

## 2024-01-28 NOTE — Progress Notes (Signed)
 The patient is a 58 year old who is now about 6 weeks status post a left open carpal tunnel release.  Her EMG/NCV studies showed severe carpal tunnel syndrome.  She is feeling a little better overall but is somewhat sore after having fallen down and being dragged by her dog during walk.  She is also a Naval architect.  Examination of her left hand shows the incision is healed nicely.  There is some firmness of the incision.  She does have some improved pinch and grip strength.  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  She is amenable to this as well.  She does have a remote history of having hand therapy on her right side.  We will work on getting that set up for her upstairs here and then we will see her back in about 6 weeks to see how she is doing overall.

## 2024-01-29 ENCOUNTER — Other Ambulatory Visit: Payer: Self-pay

## 2024-01-29 DIAGNOSIS — Z9889 Other specified postprocedural states: Secondary | ICD-10-CM

## 2024-01-30 ENCOUNTER — Emergency Department (HOSPITAL_BASED_OUTPATIENT_CLINIC_OR_DEPARTMENT_OTHER): Payer: MEDICAID

## 2024-01-30 ENCOUNTER — Encounter (HOSPITAL_BASED_OUTPATIENT_CLINIC_OR_DEPARTMENT_OTHER): Payer: Self-pay

## 2024-01-30 ENCOUNTER — Other Ambulatory Visit: Payer: Self-pay

## 2024-01-30 ENCOUNTER — Emergency Department (HOSPITAL_BASED_OUTPATIENT_CLINIC_OR_DEPARTMENT_OTHER)
Admission: EM | Admit: 2024-01-30 | Discharge: 2024-01-30 | Disposition: A | Discharge status: Home / Self Care | Payer: MEDICAID | Attending: Emergency Medicine | Admitting: Emergency Medicine

## 2024-01-30 DIAGNOSIS — R140 Abdominal distension (gaseous): Secondary | ICD-10-CM | POA: Insufficient documentation

## 2024-01-30 DIAGNOSIS — R197 Diarrhea, unspecified: Secondary | ICD-10-CM | POA: Diagnosis present

## 2024-01-30 DIAGNOSIS — E119 Type 2 diabetes mellitus without complications: Secondary | ICD-10-CM | POA: Diagnosis not present

## 2024-01-30 DIAGNOSIS — Z79899 Other long term (current) drug therapy: Secondary | ICD-10-CM | POA: Insufficient documentation

## 2024-01-30 DIAGNOSIS — E876 Hypokalemia: Secondary | ICD-10-CM | POA: Insufficient documentation

## 2024-01-30 DIAGNOSIS — I10 Essential (primary) hypertension: Secondary | ICD-10-CM | POA: Insufficient documentation

## 2024-01-30 LAB — COMPREHENSIVE METABOLIC PANEL WITH GFR
ALT: 40 U/L (ref 0–44)
AST: 37 U/L (ref 15–41)
Albumin: 3.7 g/dL (ref 3.5–5.0)
Alkaline Phosphatase: 63 U/L (ref 38–126)
Anion gap: 12 (ref 5–15)
BUN: 6 mg/dL (ref 6–20)
CO2: 25 mmol/L (ref 22–32)
Calcium: 9 mg/dL (ref 8.9–10.3)
Chloride: 106 mmol/L (ref 98–111)
Creatinine, Ser: 0.58 mg/dL (ref 0.44–1.00)
GFR, Estimated: >60 mL/min (ref >60)
Glucose, Bld: 109 mg/dL — ABNORMAL HIGH (ref 70–99)
Potassium: 3.3 mmol/L — ABNORMAL LOW (ref 3.5–5.1)
Sodium: 143 mmol/L (ref 135–145)
Total Bilirubin: 0.5 mg/dL (ref 0.0–1.2)
Total Protein: 7 g/dL (ref 6.5–8.1)

## 2024-01-30 LAB — CBC WITH DIFFERENTIAL/PLATELET
Abs Immature Granulocytes: 0.02 K/uL (ref 0.00–0.07)
Basophils Absolute: 0 K/uL (ref 0.0–0.1)
Basophils Relative: 0 %
Eosinophils Absolute: 0.3 K/uL (ref 0.0–0.5)
Eosinophils Relative: 4 %
HCT: 39.9 % (ref 36.0–46.0)
Hemoglobin: 13.3 g/dL (ref 12.0–15.0)
Immature Granulocytes: 0 %
Lymphocytes Relative: 43 %
Lymphs Abs: 2.8 K/uL (ref 0.7–4.0)
MCH: 31.6 pg (ref 26.0–34.0)
MCHC: 33.3 g/dL (ref 30.0–36.0)
MCV: 94.8 fL (ref 80.0–100.0)
Monocytes Absolute: 1 K/uL (ref 0.1–1.0)
Monocytes Relative: 14 %
Neutro Abs: 2.6 K/uL (ref 1.7–7.7)
Neutrophils Relative %: 39 %
Platelets: 256 K/uL (ref 150–400)
RBC: 4.21 MIL/uL (ref 3.87–5.11)
RDW: 13 % (ref 11.5–15.5)
WBC: 6.6 K/uL (ref 4.0–10.5)
nRBC: 0 % (ref 0.0–0.2)

## 2024-01-30 LAB — RESP PANEL BY RT-PCR (RSV, FLU A&B, COVID)  RVPGX2
Influenza A by PCR: NEGATIVE
Influenza B by PCR: NEGATIVE
Resp Syncytial Virus by PCR: NEGATIVE
SARS Coronavirus 2 by RT PCR: NEGATIVE

## 2024-01-30 LAB — LIPASE, BLOOD: Lipase: 21 U/L (ref 11–51)

## 2024-01-30 MED ORDER — SODIUM CHLORIDE 0.9 % IV SOLN: INTRAVENOUS | Status: DC

## 2024-01-30 MED ORDER — SODIUM CHLORIDE 0.9 % IV BOLUS
500.0000 mL | Freq: Once | INTRAVENOUS | Status: AC
  Administered 2024-01-30: 500 mL via INTRAVENOUS

## 2024-01-30 MED ORDER — ONDANSETRON HCL 4 MG/2ML IJ SOLN
4.0000 mg | Freq: Once | INTRAMUSCULAR | Status: AC
  Administered 2024-01-30: 4 mg via INTRAVENOUS
  Filled 2024-01-30: qty 2

## 2024-01-30 MED ORDER — LOPERAMIDE HCL 2 MG PO TABS: 2.0000 mg | ORAL_TABLET | Freq: Four times a day (QID) | ORAL | 0 refills | Status: DC | PRN

## 2024-01-30 MED ORDER — IOHEXOL 300 MG/ML  SOLN
100.0000 mL | Freq: Once | INTRAMUSCULAR | Status: AC | PRN
  Administered 2024-01-30: 100 mL via INTRAVENOUS

## 2024-01-30 NOTE — ED Triage Notes (Signed)
 Pt reports body aches, headache, N/V/D. Pt reports possible recent exposure to COVID.

## 2024-01-30 NOTE — ED Provider Notes (Addendum)
 [place] EMERGENCY DEPARTMENT AT [facility] Provider Note   CSN: [id number] Arrival date & time: 01/30/24  8397     Patient presents with: Generalized Body Aches and Headache   [name] is a 58 y.o. female.   Patient with complaint of 3-day history of bodyaches some back pain diarrhea without any blood nausea but no vomiting.  No upper respiratory symptoms no fever no dysuria.  COVID exposure at work.  But symptoms do not sound as if it is COVID related.  Patient does have headache.  No neck stiffness.  Past medical history significant for chronic pain diabetes hypertension hyperlipidemia anxiety depression.  Patient had an abdominal hysterectomy Roux-en-Y bypass in 2022.  Still actively smokes some days.  Patient started on Suboxone  July 28.  Has a 30-day supply.       Prior to Admission medications   Medication Sig Start Date End Date Taking? Authorizing Provider  acyclovir  ointment (ZOVIRAX ) 5 % Apply 1 Application topically as directed. 08/01/22   Lovie Clarity, MD  atorvastatin  (LIPITOR) 20 MG tablet Take 1 tablet (20 mg total) by mouth daily. 02/12/23 02/12/24  Lovie Clarity, MD  Buprenorphine  HCl-Naloxone  HCl 8-2 MG FILM PLACE 1 FILM UNDER THE TONGUE MORNING AND AT BEDTIME 01/12/24   Jeanelle Layman CROME, MD  celecoxib  (CELEBREX ) 200 MG capsule TAKE 1 CAPSULE(200 MG) BY MOUTH TWICE DAILY BETWEEN MEALS AS NEEDED 06/02/23   Vernetta Lonni GRADE, MD  ciclopirox  (PENLAC ) 8 % solution Apply topically at bedtime. 05/07/22   McDonald, Juliene SAUNDERS, DPM  cyclobenzaprine  (FLEXERIL ) 5 MG tablet TAKE 1 TABLET(5 MG) BY MOUTH THREE TIMES DAILY AS NEEDED FOR MUSCLE SPASMS 10/09/22   Machen, Julie, MD  diclofenac  Sodium (VOLTAREN ) 1 % GEL Apply 4 grams topically 4 times daily. 02/12/23   Lovie Clarity, MD  hydrochlorothiazide  (HYDRODIURIL ) 25 MG tablet Take 1 tablet (25 mg total) by mouth daily. 02/12/23 02/12/24  Lovie Clarity, MD  ketoconazole  (NIZORAL ) 2 % shampoo Apply topically  2 (two) times a week. 10/20/23   Lovie Clarity, MD  latanoprost  (XALATAN ) 0.005 % ophthalmic solution PUT 1 DROP INTO BOTH EYES IN THE EVENING 06/30/23   Lovie Clarity, MD  losartan  (COZAAR ) 50 MG tablet Take 1 tablet (50 mg total) by mouth daily. 10/20/23 10/19/24  Lovie Clarity, MD  metroNIDAZOLE  (FLAGYL ) 500 MG tablet Take 1 tablet (500 mg total) by mouth 2 (two) times daily. 04/06/23   Cleotilde Perkins, DO  Misc. Devices MISC Salicylic acid 4.3% and Undecylenic acid.  Dispense 60 mL.  Please apply to affected nails daily as directed 03/22/21   [provider]  omeprazole  (PRILOSEC) 40 MG capsule Take 1 capsule (40 mg total) by mouth daily. 10/16/22 12/31/23  Lovie Clarity, MD  Prenatal Vit-DSS-Fe Fum-FA (PRENATAL 19) tablet Take 1 tablet by mouth daily. 02/20/21   [provider]  Semaglutide , 2 MG/DOSE, (OZEMPIC , 2 MG/DOSE,) 8 MG/3ML SOPN Inject 2 mg into the skin once a week. 12/29/23   Nooruddin, Saad, MD  triamcinolone  cream (KENALOG ) 0.5 % APPLY TOPICALLY 3 TIMES DAILY NOT FOR CHRONIC DAILY USE 12/02/23   Lovie Clarity, MD  ursodiol  (ACTIGALL ) 300 MG capsule Take one tab twice a day with food BEGINNING 14 DAYS AFTER SURGERY 10/16/22   Lovie Clarity, MD    Allergies: Patient has no known allergies.    Review of Systems  Constitutional:  Negative for chills and fever.  HENT:  Negative for congestion, ear pain and sore throat.   Eyes:  Negative for  pain and visual disturbance.  Respiratory:  Negative for cough and shortness of breath.   Cardiovascular:  Negative for chest pain and palpitations.  Gastrointestinal:  Positive for diarrhea and nausea. Negative for abdominal pain and vomiting.  Genitourinary:  Negative for dysuria and hematuria.  Musculoskeletal:  Positive for myalgias. Negative for arthralgias and back pain.  Skin:  Negative for color change and rash.  Neurological:  Positive for headaches. Negative for seizures and syncope.  All other systems reviewed and are  negative.   Updated Vital Signs BP 135/62   Pulse 86   Temp 98.5 F (36.9 C) (Oral)   Resp 16   Ht 1.727 m (5' 8)   Wt 122.5 kg   SpO2 95%   BMI 41.05 kg/m   Physical Exam Vitals and nursing note reviewed.  Constitutional:      General: She is not in acute distress.    Appearance: Normal appearance. She is well-developed.  HENT:     Head: Normocephalic and atraumatic.  Eyes:     Extraocular Movements: Extraocular movements intact.     Conjunctiva/sclera: Conjunctivae normal.     Pupils: Pupils are equal, round, and reactive to light.  Cardiovascular:     Rate and Rhythm: Normal rate and regular rhythm.     Heart sounds: No murmur heard. Pulmonary:     Effort: Pulmonary effort is normal. No respiratory distress.     Breath sounds: No wheezing, rhonchi or rales.  Abdominal:     General: There is distension.     Palpations: Abdomen is soft.     Tenderness: There is no abdominal tenderness. There is no guarding.  Musculoskeletal:        General: No swelling.     Cervical back: Normal range of motion and neck supple. No rigidity or tenderness.  Skin:    General: Skin is warm and dry.     Capillary Refill: Capillary refill takes less than 2 seconds.  Neurological:     General: No focal deficit present.     Mental Status: She is alert and oriented to person, place, and time.  Psychiatric:        Mood and Affect: Mood normal.     (all labs ordered are listed, but only abnormal results are displayed) Labs Reviewed  RESP PANEL BY RT-PCR (RSV, FLU A&B, COVID)  [id number]  COMPREHENSIVE METABOLIC PANEL WITH GFR  CBC WITH DIFFERENTIAL/PLATELET  LIPASE, BLOOD    EKG: None  Radiology: No results found.   Procedures   Medications Ordered in the ED  sodium chloride  0.9 % bolus 500 mL (has no administration in time range)  ondansetron  (ZOFRAN ) injection 4 mg (has no administration in time range)  0.9 %  sodium chloride  infusion (has no administration in time range)                                     Medical Decision Making Amount and/or Complexity of Data Reviewed Labs: ordered. Radiology: ordered.  Risk Prescription drug management.   Patient's respiratory panel negative.  Symptoms do not seem very typical for COVID.  Little bit more concerning for the diarrhea body aches.  And based on her age needs CT scan of her abdomen and pelvis.  No respiratory symptoms.  No fever here.  Oxygen saturations are good.  Patient's complete metabolic panel significant for potassium of 3.3 renal function is normal LFTs are normal  glucose 109.  Anion gap normal.  CBC white count 6.6 hemoglobin 13.3 platelets 256.  Lipase 21.  As already mentioned respiratory panel negative.  CT scan abdomen and pelvis no acute findings.  Colonic diverticulosis no active diverticulitis with prior gastric bypass.  Will treat patient symptomatically for the diarrhea with Imodium  AD available over-the-counter.  And close follow-up with her doctor  Final diagnoses:  Diarrhea, unspecified type    ED Discharge Orders     None          Geraldene Hamilton, MD 01/30/24 2012    Geraldene Hamilton, MD 01/30/24 2012    Geraldene Hamilton, MD 01/30/24 [phone]

## 2024-01-30 NOTE — Discharge Instructions (Signed)
 Would recommend Imodium  A-D.  It is available over-the-counter but prescription provided.  Make an appointment follow-up with your doctor.  CAT scan and lab workup here without any significant abnormalities inside the abdomen.  Potassium was a little low at 3.3 so have it rechecked by your doctor in a week or 2.

## 2024-02-06 ENCOUNTER — Ambulatory Visit (HOSPITAL_COMMUNITY): Payer: Self-pay | Admitting: Internal Medicine

## 2024-02-06 ENCOUNTER — Ambulatory Visit (HOSPITAL_COMMUNITY)
Admission: RE | Admit: 2024-02-06 | Discharge: 2024-02-06 | Disposition: A | Discharge status: Home / Self Care | Payer: MEDICAID | Source: Ambulatory Visit | Attending: Internal Medicine | Admitting: Internal Medicine

## 2024-02-06 ENCOUNTER — Encounter (HOSPITAL_COMMUNITY): Payer: Self-pay

## 2024-02-06 ENCOUNTER — Ambulatory Visit (INDEPENDENT_AMBULATORY_CARE_PROVIDER_SITE_OTHER): Payer: MEDICAID

## 2024-02-06 VITALS — BP 128/65 | HR 91 | Temp 98.0°F | Resp 16

## 2024-02-06 DIAGNOSIS — M79674 Pain in right toe(s): Secondary | ICD-10-CM

## 2024-02-06 MED ORDER — IBUPROFEN 600 MG PO TABS: 600.0000 mg | ORAL_TABLET | Freq: Three times a day (TID) | ORAL | 0 refills | Status: AC | PRN

## 2024-02-06 NOTE — ED Triage Notes (Signed)
 Patient here today with c/o right foot pain X 2 week. Patient stumped right great toe on the floor. Patient has since been having worsening pain, swelling, and redness.

## 2024-02-06 NOTE — ED Provider Notes (Signed)
 [facility]    CSN: [id number] Arrival date & time: 02/06/24  1308      History   Chief Complaint Chief Complaint  Patient presents with   Foot Injury    Entered by patient    HPI [name] is a 58 y.o. female.   58 year old female presents urgent care with complaints of right great toe pain, swelling and redness.  This started 2 weeks ago after she stomped her toe while walking briskly.  She reports that overall the toe has not changed much in 2 weeks.  She has been able to do most of her regular activities but reports that she has continued to have pain.  She has especially noticed it with a class that she is taking where she is having to be on her feet a lot.  She has not used any ice on the area.  She denies any history of injury to the toe.   Foot Injury Associated symptoms: no back pain and no fever     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HCC)    Chronic pain    Depression    Diabetes (HCC)    Hyperlipidemia    Hypertension    PONV (postoperative nausea and vomiting)    Sleep apnea     Patient Active Problem List   Diagnosis Date Noted   Carpal tunnel syndrome, left upper limb 10/29/2023   Acute pain of right wrist 02/12/2023   Pain in left shoulder 05/22/2022   Heat intolerance 11/28/2021   Vision changes 11/28/2021   Carpal tunnel syndrome, right upper limb 09/19/2021   Osteoarthritis of right shoulder 08/08/2021   Opioid use disorder in remission 07/04/2021   Healthcare maintenance 07/04/2021   Onychomycosis 12/12/2020   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   Seborrheic dermatitis 08/01/2020   Other hyperlipidemia 10/21/2019   Sleep apnea, obstructive 10/21/2019   Mood disorder (HCC) 12/18/2017   Essential hypertension 11/25/2017   Type 2 diabetes mellitus without complications (HCC) 11/24/2017   Severe obesity (BMI >= 40) (HCC) 09/30/2017    Past Surgical History:  Procedure  Laterality Date   ABDOMINAL HYSTERECTOMY  1999   CARPAL TUNNEL RELEASE Right 10/25/2021   Procedure: RIGHT CARPAL TUNNEL RELEASE;  Surgeon: Vernetta Lonni GRADE, MD;  Location: [facility];  Service: Orthopedics;  Laterality: Right;   FRACTURE SURGERY     LUNG LOBECTOMY  2010   right lower lobe   ROUX-EN-Y GASTRIC BYPASS  10/30/2020    OB History   No obstetric history on file.      Home Medications    Prior to Admission medications   Medication Sig Start Date End Date Taking? Authorizing Provider  ibuprofen  (ADVIL ) 600 MG tablet Take 1 tablet (600 mg total) by mouth every 8 (eight) hours as needed for moderate pain (pain score 4-6). 02/06/24  Yes [place] Ice A, PA-C  acyclovir  ointment (ZOVIRAX ) 5 % Apply 1 Application topically as directed. 08/01/22   Lovie Clarity, MD  atorvastatin  (LIPITOR) 20 MG tablet Take 1 tablet (20 mg total) by mouth daily. 02/12/23 02/12/24  Lovie Clarity, MD  Buprenorphine  HCl-Naloxone  HCl 8-2 MG FILM PLACE 1 FILM UNDER THE TONGUE MORNING AND AT BEDTIME 01/12/24   Jeanelle Layman CROME, MD  celecoxib  (CELEBREX ) 200 MG capsule TAKE 1 CAPSULE(200 MG) BY MOUTH TWICE DAILY BETWEEN MEALS AS NEEDED 06/02/23   Vernetta Lonni GRADE, MD  ciclopirox  (PENLAC )  8 % solution Apply topically at bedtime. 05/07/22   McDonald, Juliene SAUNDERS, DPM  cyclobenzaprine  (FLEXERIL ) 5 MG tablet TAKE 1 TABLET(5 MG) BY MOUTH THREE TIMES DAILY AS NEEDED FOR MUSCLE SPASMS 10/09/22   Machen, Julie, MD  diclofenac  Sodium (VOLTAREN ) 1 % GEL Apply 4 grams topically 4 times daily. 02/12/23   Lovie Clarity, MD  hydrochlorothiazide  (HYDRODIURIL ) 25 MG tablet Take 1 tablet (25 mg total) by mouth daily. 02/12/23 02/12/24  Lovie Clarity, MD  ketoconazole  (NIZORAL ) 2 % shampoo Apply topically 2 (two) times a week. 10/20/23   Lovie Clarity, MD  latanoprost  (XALATAN ) 0.005 % ophthalmic solution PUT 1 DROP INTO BOTH EYES IN THE EVENING 06/30/23   Lovie Clarity, MD  losartan  (COZAAR ) 50 MG tablet  Take 1 tablet (50 mg total) by mouth daily. 10/20/23 10/19/24  Lovie Clarity, MD  omeprazole  (PRILOSEC) 40 MG capsule Take 1 capsule (40 mg total) by mouth daily. 10/16/22 12/31/23  Lovie Clarity, MD  Prenatal Vit-DSS-Fe Fum-FA (PRENATAL 19) tablet Take 1 tablet by mouth daily. 02/20/21   [provider]  Semaglutide , 2 MG/DOSE, (OZEMPIC , 2 MG/DOSE,) 8 MG/3ML SOPN Inject 2 mg into the skin once a week. 12/29/23   Nooruddin, Saad, MD  triamcinolone  cream (KENALOG ) 0.5 % APPLY TOPICALLY 3 TIMES DAILY NOT FOR CHRONIC DAILY USE 12/02/23   Lovie Clarity, MD    Family History Family History  Problem Relation Age of Onset   COPD Mother    Alcohol abuse Father    Pancreatic cancer Father    Diabetes Maternal Grandmother    Hyperlipidemia Maternal Grandmother    Hypertension Maternal Grandmother    BRCA 1/2 Neg Hx    Breast cancer Neg Hx     Social History Social History   Tobacco Use   Smoking status: Some Days    Current packs/day: 0.00    Types: Cigarettes    Last attempt to quit: 07/25/2013    Years since quitting: 10.5   Smokeless tobacco: Never  Vaping Use   Vaping status: Never Used  Substance Use Topics   Alcohol use: Yes    Alcohol/week: 1.0 - 2.0 standard drink of alcohol    Types: 1 - 2 Glasses of wine per week    Comment: rare   Drug use: No     Allergies   Patient has no known allergies.   Review of Systems Review of Systems  Constitutional:  Negative for chills and fever.  HENT:  Negative for ear pain and sore throat.   Eyes:  Negative for pain and visual disturbance.  Respiratory:  Negative for cough and shortness of breath.   Cardiovascular:  Negative for chest pain and palpitations.  Gastrointestinal:  Negative for abdominal pain and vomiting.  Genitourinary:  Negative for dysuria and hematuria.  Musculoskeletal:  Negative for arthralgias and back pain.       Right foot pain, swelling and redness   Skin:  Negative for color change and rash.  Neurological:   Negative for seizures and syncope.  All other systems reviewed and are negative.    Physical Exam Triage Vital Signs ED Triage Vitals  Encounter Vitals Group     BP 02/06/24 1317 128/65     Girls Systolic BP Percentile --      Girls Diastolic BP Percentile --      Boys Systolic BP Percentile --      Boys Diastolic BP Percentile --      Pulse Rate 02/06/24 1317 91  Resp 02/06/24 1317 16     Temp 02/06/24 1317 98 F (36.7 C)     Temp Source 02/06/24 1317 Oral     SpO2 02/06/24 1317 95 %     Weight --      Height --      Head Circumference --      Peak Flow --      Pain Score 02/06/24 1319 7     Pain Loc --      Pain Education --      Exclude from Growth Chart --    No data found.  Updated Vital Signs BP 128/65 (BP Location: Left Arm)   Pulse 91   Temp 98 F (36.7 C) (Oral)   Resp 16   SpO2 95%   Visual Acuity Right Eye Distance:   Left Eye Distance:   Bilateral Distance:    Right Eye Near:   Left Eye Near:    Bilateral Near:     Physical Exam Vitals and nursing note reviewed.  Constitutional:      General: She is not in acute distress.    Appearance: She is well-developed.  HENT:     Head: Normocephalic and atraumatic.  Eyes:     Conjunctiva/sclera: Conjunctivae normal.  Cardiovascular:     Rate and Rhythm: Normal rate and regular rhythm.     Pulses:          Dorsalis pedis pulses are 2+ on the right side.       Posterior tibial pulses are 2+ on the right side.     Heart sounds: No murmur heard. Pulmonary:     Effort: Pulmonary effort is normal. No respiratory distress.     Breath sounds: Normal breath sounds.  Abdominal:     Palpations: Abdomen is soft.     Tenderness: There is no abdominal tenderness.  Musculoskeletal:        General: No swelling.     Cervical back: Neck supple.     Right foot: Normal range of motion.       Feet:  Feet:     Right foot:     Skin integrity: Skin integrity normal.     Left foot:     Skin integrity: Skin  integrity normal.     Comments: Brisk capillary refill on all toes Skin:    General: Skin is warm and dry.     Capillary Refill: Capillary refill takes less than 2 seconds.  Neurological:     Mental Status: She is alert.  Psychiatric:        Mood and Affect: Mood normal.      UC Treatments / Results  Labs (all labs ordered are listed, but only abnormal results are displayed) Labs Reviewed - No data to display  EKG   Radiology No results found.  Procedures Procedures (including critical care time)  Medications Ordered in UC Medications - No data to display  Initial Impression / Assessment and Plan / UC Course  I have reviewed the triage vital signs and the nursing notes.  Pertinent labs & imaging results that were available during my care of the patient were reviewed by me and considered in my medical decision making (see chart for details).     Great toe pain, right   Right great toe x-ray. On brief evaluation, there dose not appear to be any fractures. Do not suspect gout as the onset was at the time of the injury. After the radiologist has read the  x-ray, if anything changes from our evaluation then we will contact you. We can try to treat with the following and if no improvement, then will need to follow up with orthopedics:  Ibuprofen  600 mg 1 tablet every 8 hours as needed for pain Ice the area 2-3 times daily for 10-15 minutes to help with pain and swelling. Do not apply ice directly to the skin.  If no improvement in 10-14 days, then recommend following up with orthopedics. If it worsens, then return to urgent care.   Final Clinical Impressions(s) / UC Diagnoses   Final diagnoses:  Great toe pain, right     Discharge Instructions      Right great toe x-ray. On brief evaluation, there dose not appear to be any fractures. After the radiologist has read the x-ray, if anything changes from our evaluation then we will contact you. We can try to treat with the  following and if no improvement, then will need to follow up with orthopedics:  Ibuprofen  600 mg 1 tablet every 8 hours as needed for pain Ice the area 2-3 times daily for 10-15 minutes to help with pain and swelling. Do not apply ice directly to the skin.  If no improvement in 10-14 days, then recommend following up with orthopedics. If it worsens, then return to urgent care.      ED Prescriptions     Medication Sig Dispense Auth. Provider   ibuprofen  (ADVIL ) 600 MG tablet Take 1 tablet (600 mg total) by mouth every 8 (eight) hours as needed for moderate pain (pain score 4-6). 30 tablet Teresa Almarie LABOR, [place]      PDMP not reviewed this encounter.   Teresa Almarie LABOR, PA-C 02/06/24 1451

## 2024-02-06 NOTE — Discharge Instructions (Addendum)
 Right great toe x-ray. On brief evaluation, there dose not appear to be any fractures. After the radiologist has read the x-ray, if anything changes from our evaluation then we will contact you. We can try to treat with the following and if no improvement, then will need to follow up with orthopedics:  Ibuprofen  600 mg 1 tablet every 8 hours as needed for pain Ice the area 2-3 times daily for 10-15 minutes to help with pain and swelling. Do not apply ice directly to the skin.  If no improvement in 10-14 days, then recommend following up with orthopedics. If it worsens, then return to urgent care.

## 2024-02-23 ENCOUNTER — Ambulatory Visit: Payer: MEDICAID | Admitting: Occupational Therapy

## 2024-02-26 ENCOUNTER — Other Ambulatory Visit: Payer: Self-pay

## 2024-02-26 MED ORDER — LATANOPROST 0.005 % OP SOLN: OPHTHALMIC | 3 refills | Status: AC

## 2024-03-01 LAB — OPHTHALMOLOGY REPORT-SCANNED

## 2024-03-10 ENCOUNTER — Encounter: Payer: Self-pay | Admitting: Orthopaedic Surgery

## 2024-03-10 ENCOUNTER — Ambulatory Visit (INDEPENDENT_AMBULATORY_CARE_PROVIDER_SITE_OTHER): Payer: MEDICAID | Admitting: Orthopaedic Surgery

## 2024-03-10 DIAGNOSIS — Z9889 Other specified postprocedural states: Secondary | ICD-10-CM

## 2024-03-10 NOTE — Progress Notes (Signed)
 The patient is a 58 year old who is now 12 weeks status post a left open carpal tunnel release.  She has had carpal tunnel surgery on the right side as well.  She is also a Naval [employer].  She says she still has some numbness but overall is getting better.  She does have improved pinch and grip strength on the left side.  The incisions healed nicely.  There is no evidence of muscle atrophy.  Having had this before on the right side she knows it takes a while for the nerve to recover given the severity of her carpal tunnel syndrome.  All question concerns were answered addressed.  Follow-up can be as needed.  She knows to reach out anytime if there are any issues.

## 2024-03-15 ENCOUNTER — Other Ambulatory Visit: Payer: Self-pay | Admitting: Family Medicine

## 2024-03-16 ENCOUNTER — Telehealth: Payer: Self-pay | Admitting: *Deleted

## 2024-03-16 ENCOUNTER — Other Ambulatory Visit: Payer: Self-pay

## 2024-03-16 MED ORDER — BUPRENORPHINE HCL-NALOXONE HCL 8-2 MG SL FILM: ORAL_FILM | SUBLINGUAL | 1 refills | Status: DC

## 2024-03-16 NOTE — Telephone Encounter (Signed)
 Copied from CRM #[id number]. Topic: Clinical - Prescription Issue >> Mar 16, 2024 11:11 AM Miquel SAILOR wrote: Reason for CRM: suboxone -Patient stated pharmacy denied medication. I am not showing on list of discontinued. Needs refill on mediation out of meds.  [phone]

## 2024-03-16 NOTE — Telephone Encounter (Signed)
 Refill request was sent to Dr. Shawn.  Patient was call and notified that her Suboxone  has been refilled.  Copied from CRM #[id number]. Topic: Clinical - Prescription Issue >> Mar 16, 2024 11:11 AM Miquel SAILOR wrote: Reason for CRM: suboxone -Patient stated pharmacy denied medication. I am not showing on list of discontinued. Needs refill on mediation out of meds.  [phone]

## 2024-03-25 ENCOUNTER — Ambulatory Visit: Payer: Self-pay

## 2024-04-02 ENCOUNTER — Telehealth: Payer: Self-pay

## 2024-04-02 NOTE — Telephone Encounter (Signed)
 Prior Authorization for patient (Ozempic  (2 MG/DOSE) 8MG /3ML pen-injectors) came through on cover my meds was submitted on cover my meds awaiting approval or denial.  XZB:[id number]

## 2024-04-02 NOTE — Telephone Encounter (Signed)
 [name] (Key: [facility]) Rx #: [id number] Ozempic  (2 MG/DOSE) 8MG JONELLE pen-injectors Form [employer] Electronic Prior Authorization Form Created 1 hour ago Sent to Plan 34 minutes ago Plan Response 34 minutes ago Submit Clinical Questions 31 minutes ago Determination Favorable 8 minutes ago Message from Plan Approved. OZEMPIC  (2 MG/DOSE) 8MG /3ML Soln Pen-inj is approved from 04/02/2024 to 04/02/2025. All strengths of the drug are approved.. Authorization Expiration Date: April 02, 2025.  Lvm for patient regarding the approval.

## 2024-04-19 ENCOUNTER — Encounter: Payer: Self-pay | Admitting: Radiology

## 2024-04-20 ENCOUNTER — Other Ambulatory Visit: Payer: Self-pay

## 2024-04-20 ENCOUNTER — Emergency Department (HOSPITAL_BASED_OUTPATIENT_CLINIC_OR_DEPARTMENT_OTHER): Payer: MEDICAID | Admitting: Radiology

## 2024-04-20 ENCOUNTER — Emergency Department (HOSPITAL_BASED_OUTPATIENT_CLINIC_OR_DEPARTMENT_OTHER)
Admission: EM | Admit: 2024-04-20 | Discharge: 2024-04-20 | Disposition: A | Discharge status: Home / Self Care | Payer: MEDICAID | Attending: Emergency Medicine | Admitting: Emergency Medicine

## 2024-04-20 ENCOUNTER — Encounter (HOSPITAL_BASED_OUTPATIENT_CLINIC_OR_DEPARTMENT_OTHER): Payer: Self-pay

## 2024-04-20 DIAGNOSIS — I10 Essential (primary) hypertension: Secondary | ICD-10-CM | POA: Insufficient documentation

## 2024-04-20 DIAGNOSIS — W06XXXA Fall from bed, initial encounter: Secondary | ICD-10-CM | POA: Insufficient documentation

## 2024-04-20 DIAGNOSIS — M25512 Pain in left shoulder: Secondary | ICD-10-CM | POA: Insufficient documentation

## 2024-04-20 DIAGNOSIS — F17210 Nicotine dependence, cigarettes, uncomplicated: Secondary | ICD-10-CM | POA: Diagnosis not present

## 2024-04-20 DIAGNOSIS — E119 Type 2 diabetes mellitus without complications: Secondary | ICD-10-CM | POA: Insufficient documentation

## 2024-04-20 DIAGNOSIS — M25552 Pain in left hip: Secondary | ICD-10-CM | POA: Insufficient documentation

## 2024-04-20 DIAGNOSIS — W19XXXA Unspecified fall, initial encounter: Secondary | ICD-10-CM

## 2024-04-20 MED ORDER — NAPROXEN 500 MG PO TABS: 500.0000 mg | ORAL_TABLET | Freq: Two times a day (BID) | ORAL | 0 refills | Status: AC

## 2024-04-20 MED ORDER — OXYCODONE HCL 5 MG PO TABS
5.0000 mg | ORAL_TABLET | Freq: Once | ORAL | Status: AC
  Administered 2024-04-20: 5 mg via ORAL
  Filled 2024-04-20: qty 1

## 2024-04-20 NOTE — Discharge Instructions (Addendum)
 You were evaluated in the Emergency Department and after careful evaluation, we did not find any emergent condition requiring admission or further testing in the hospital.  Your exam/testing today is overall reassuring.  X-rays did not show any significant traumatic injuries.  Use Naprosyn  twice daily as needed for pain.  Please return to the Emergency Department if you experience any worsening of your condition.   Thank you for allowing us  to be a part of your care.
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
## 2024-05-04 ENCOUNTER — Ambulatory Visit (HOSPITAL_BASED_OUTPATIENT_CLINIC_OR_DEPARTMENT_OTHER): Payer: MEDICAID | Admitting: Student

## 2024-05-07 ENCOUNTER — Other Ambulatory Visit: Payer: Self-pay | Admitting: Student

## 2024-05-07 DIAGNOSIS — E119 Type 2 diabetes mellitus without complications: Secondary | ICD-10-CM

## 2024-05-24 ENCOUNTER — Other Ambulatory Visit: Payer: Self-pay

## 2024-05-24 ENCOUNTER — Ambulatory Visit: Payer: MEDICAID

## 2024-05-24 DIAGNOSIS — M2560 Stiffness of unspecified joint, not elsewhere classified: Secondary | ICD-10-CM | POA: Insufficient documentation

## 2024-05-24 DIAGNOSIS — G8929 Other chronic pain: Secondary | ICD-10-CM | POA: Diagnosis not present

## 2024-05-24 DIAGNOSIS — M6281 Muscle weakness (generalized): Secondary | ICD-10-CM | POA: Diagnosis present

## 2024-05-24 DIAGNOSIS — M25512 Pain in left shoulder: Secondary | ICD-10-CM | POA: Insufficient documentation
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
## 2024-05-25 ENCOUNTER — Other Ambulatory Visit: Payer: Self-pay

## 2024-05-25 MED ORDER — BUPRENORPHINE HCL-NALOXONE HCL 8-2 MG SL FILM: ORAL_FILM | SUBLINGUAL | 1 refills | Status: DC

## 2024-05-25 NOTE — Telephone Encounter (Signed)
 Copied from CRM #[id number]. Topic: Clinical - Medication Refill >> May 25, 2024  8:21 AM Diannia H wrote: Medication: Buprenorphine  HCl-Naloxone  HCl 8-2 MG FILM  Has the patient contacted their pharmacy? Yes (Agent: If no, request that the patient contact the pharmacy for the refill. If patient does not wish to contact the pharmacy document the reason why and proceed with request.) (Agent: If yes, when and what did the pharmacy advise?)  This is the patient's preferred pharmacy:  [facility] [phone] - [place], [place] - [address] AT [facility] [address] [place] [address] Phone: [phone] Fax: [phone]  Is this the correct pharmacy for this prescription? Yes If no, delete pharmacy and type the correct one.   Has the prescription been filled recently? No  Is the patient out of the medication? Yes  Has the patient been seen for an appointment in the last year OR does the patient have an upcoming appointment? Yes  Can we respond through MyChart? Yes  Agent: Please be advised that Rx refills may take up to 3 business days. We ask that you follow-up with your pharmacy.

## 2024-05-25 NOTE — Telephone Encounter (Signed)
 Last rx written - 03/16/24 with 1 refill. Last OV - 12/29/23. TOX - 12/29/23.

## 2024-05-25 NOTE — Addendum Note (Signed)
 Addended by: ROSANN CHRISTMAS DIETER on: 05/25/2024 12:09 PM   Modules accepted: Orders

## 2024-06-04 ENCOUNTER — Ambulatory Visit: Payer: MEDICAID | Admitting: Physical Therapy

## 2024-06-04 NOTE — Telephone Encounter (Signed)
 Spoke with patient regarding no show to appointment. She mixed up her dates. She plans to call back to reschedule.

## 2024-07-21 ENCOUNTER — Other Ambulatory Visit: Payer: Self-pay

## 2024-07-28 ENCOUNTER — Telehealth: Payer: MEDICAID | Admitting: Neurology
